# Patient Record
Sex: Male | Born: 1975 | Race: White | Hispanic: No | Marital: Single | Smoking: Never smoker
Health system: Southern US, Community
[De-identification: ages and names within clinical notes are randomized; demographics above are authoritative.]

## PROBLEM LIST (undated history)

## (undated) DIAGNOSIS — G834 Cauda equina syndrome: Secondary | ICD-10-CM

## (undated) DIAGNOSIS — E291 Testicular hypofunction: Secondary | ICD-10-CM

## (undated) DIAGNOSIS — I10 Essential (primary) hypertension: Secondary | ICD-10-CM

## (undated) HISTORY — DX: Cauda equina syndrome: G83.4

## (undated) HISTORY — PX: OTHER SURGICAL HISTORY: SHX169

---

## 1995-08-05 HISTORY — PX: BACK SURGERY: SHX140

## 1995-08-05 HISTORY — PX: OTHER SURGICAL HISTORY: SHX169

## 2008-12-25 ENCOUNTER — Ambulatory Visit: Payer: Self-pay

## 2009-08-04 HISTORY — PX: SHOULDER SURGERY: SHX246

## 2011-08-25 DIAGNOSIS — E236 Other disorders of pituitary gland: Secondary | ICD-10-CM | POA: Diagnosis not present

## 2011-08-25 DIAGNOSIS — R030 Elevated blood-pressure reading, without diagnosis of hypertension: Secondary | ICD-10-CM | POA: Diagnosis not present

## 2012-02-24 DIAGNOSIS — E236 Other disorders of pituitary gland: Secondary | ICD-10-CM | POA: Diagnosis not present

## 2012-02-24 DIAGNOSIS — R030 Elevated blood-pressure reading, without diagnosis of hypertension: Secondary | ICD-10-CM | POA: Diagnosis not present

## 2012-09-13 DIAGNOSIS — R030 Elevated blood-pressure reading, without diagnosis of hypertension: Secondary | ICD-10-CM | POA: Diagnosis not present

## 2012-09-13 DIAGNOSIS — E230 Hypopituitarism: Secondary | ICD-10-CM | POA: Insufficient documentation

## 2012-09-13 DIAGNOSIS — F411 Generalized anxiety disorder: Secondary | ICD-10-CM | POA: Diagnosis not present

## 2012-09-13 DIAGNOSIS — E236 Other disorders of pituitary gland: Secondary | ICD-10-CM | POA: Diagnosis not present

## 2012-09-13 DIAGNOSIS — N429 Disorder of prostate, unspecified: Secondary | ICD-10-CM | POA: Diagnosis not present

## 2012-12-07 ENCOUNTER — Emergency Department: Payer: Self-pay | Admitting: Emergency Medicine

## 2012-12-07 DIAGNOSIS — S81009A Unspecified open wound, unspecified knee, initial encounter: Secondary | ICD-10-CM | POA: Diagnosis not present

## 2013-08-04 HISTORY — PX: OTHER SURGICAL HISTORY: SHX169

## 2013-11-29 DIAGNOSIS — E291 Testicular hypofunction: Secondary | ICD-10-CM | POA: Diagnosis not present

## 2013-12-14 DIAGNOSIS — R946 Abnormal results of thyroid function studies: Secondary | ICD-10-CM | POA: Diagnosis not present

## 2013-12-27 DIAGNOSIS — E063 Autoimmune thyroiditis: Secondary | ICD-10-CM | POA: Diagnosis not present

## 2013-12-27 DIAGNOSIS — E039 Hypothyroidism, unspecified: Secondary | ICD-10-CM | POA: Diagnosis not present

## 2013-12-27 DIAGNOSIS — E291 Testicular hypofunction: Secondary | ICD-10-CM | POA: Diagnosis not present

## 2014-01-24 DIAGNOSIS — H579 Unspecified disorder of eye and adnexa: Secondary | ICD-10-CM | POA: Diagnosis not present

## 2014-02-27 DIAGNOSIS — E039 Hypothyroidism, unspecified: Secondary | ICD-10-CM | POA: Diagnosis not present

## 2014-03-14 DIAGNOSIS — E291 Testicular hypofunction: Secondary | ICD-10-CM | POA: Diagnosis not present

## 2014-03-14 DIAGNOSIS — E039 Hypothyroidism, unspecified: Secondary | ICD-10-CM | POA: Diagnosis not present

## 2014-03-14 DIAGNOSIS — E063 Autoimmune thyroiditis: Secondary | ICD-10-CM | POA: Diagnosis not present

## 2014-03-21 DIAGNOSIS — M5412 Radiculopathy, cervical region: Secondary | ICD-10-CM | POA: Diagnosis not present

## 2014-03-21 DIAGNOSIS — M9981 Other biomechanical lesions of cervical region: Secondary | ICD-10-CM | POA: Diagnosis not present

## 2014-03-21 DIAGNOSIS — M62838 Other muscle spasm: Secondary | ICD-10-CM | POA: Diagnosis not present

## 2014-03-21 DIAGNOSIS — M999 Biomechanical lesion, unspecified: Secondary | ICD-10-CM | POA: Diagnosis not present

## 2014-03-22 DIAGNOSIS — M5412 Radiculopathy, cervical region: Secondary | ICD-10-CM | POA: Diagnosis not present

## 2014-03-22 DIAGNOSIS — M9981 Other biomechanical lesions of cervical region: Secondary | ICD-10-CM | POA: Diagnosis not present

## 2014-03-22 DIAGNOSIS — M999 Biomechanical lesion, unspecified: Secondary | ICD-10-CM | POA: Diagnosis not present

## 2014-03-22 DIAGNOSIS — M62838 Other muscle spasm: Secondary | ICD-10-CM | POA: Diagnosis not present

## 2014-03-23 DIAGNOSIS — M62838 Other muscle spasm: Secondary | ICD-10-CM | POA: Diagnosis not present

## 2014-03-23 DIAGNOSIS — M5412 Radiculopathy, cervical region: Secondary | ICD-10-CM | POA: Diagnosis not present

## 2014-03-23 DIAGNOSIS — M999 Biomechanical lesion, unspecified: Secondary | ICD-10-CM | POA: Diagnosis not present

## 2014-03-23 DIAGNOSIS — M9981 Other biomechanical lesions of cervical region: Secondary | ICD-10-CM | POA: Diagnosis not present

## 2014-03-27 DIAGNOSIS — M9981 Other biomechanical lesions of cervical region: Secondary | ICD-10-CM | POA: Diagnosis not present

## 2014-03-27 DIAGNOSIS — M62838 Other muscle spasm: Secondary | ICD-10-CM | POA: Diagnosis not present

## 2014-03-27 DIAGNOSIS — M5412 Radiculopathy, cervical region: Secondary | ICD-10-CM | POA: Diagnosis not present

## 2014-03-27 DIAGNOSIS — M999 Biomechanical lesion, unspecified: Secondary | ICD-10-CM | POA: Diagnosis not present

## 2014-03-30 DIAGNOSIS — M9981 Other biomechanical lesions of cervical region: Secondary | ICD-10-CM | POA: Diagnosis not present

## 2014-03-30 DIAGNOSIS — M5412 Radiculopathy, cervical region: Secondary | ICD-10-CM | POA: Diagnosis not present

## 2014-03-30 DIAGNOSIS — M999 Biomechanical lesion, unspecified: Secondary | ICD-10-CM | POA: Diagnosis not present

## 2014-03-30 DIAGNOSIS — M62838 Other muscle spasm: Secondary | ICD-10-CM | POA: Diagnosis not present

## 2014-03-31 DIAGNOSIS — M999 Biomechanical lesion, unspecified: Secondary | ICD-10-CM | POA: Diagnosis not present

## 2014-03-31 DIAGNOSIS — M62838 Other muscle spasm: Secondary | ICD-10-CM | POA: Diagnosis not present

## 2014-03-31 DIAGNOSIS — M9981 Other biomechanical lesions of cervical region: Secondary | ICD-10-CM | POA: Diagnosis not present

## 2014-03-31 DIAGNOSIS — M5412 Radiculopathy, cervical region: Secondary | ICD-10-CM | POA: Diagnosis not present

## 2014-04-04 DIAGNOSIS — M999 Biomechanical lesion, unspecified: Secondary | ICD-10-CM | POA: Diagnosis not present

## 2014-04-04 DIAGNOSIS — M9981 Other biomechanical lesions of cervical region: Secondary | ICD-10-CM | POA: Diagnosis not present

## 2014-04-04 DIAGNOSIS — M5412 Radiculopathy, cervical region: Secondary | ICD-10-CM | POA: Diagnosis not present

## 2014-04-04 DIAGNOSIS — M62838 Other muscle spasm: Secondary | ICD-10-CM | POA: Diagnosis not present

## 2014-04-06 DIAGNOSIS — M999 Biomechanical lesion, unspecified: Secondary | ICD-10-CM | POA: Diagnosis not present

## 2014-04-06 DIAGNOSIS — M9981 Other biomechanical lesions of cervical region: Secondary | ICD-10-CM | POA: Diagnosis not present

## 2014-04-06 DIAGNOSIS — M62838 Other muscle spasm: Secondary | ICD-10-CM | POA: Diagnosis not present

## 2014-04-06 DIAGNOSIS — M5412 Radiculopathy, cervical region: Secondary | ICD-10-CM | POA: Diagnosis not present

## 2014-04-07 DIAGNOSIS — M9981 Other biomechanical lesions of cervical region: Secondary | ICD-10-CM | POA: Diagnosis not present

## 2014-04-07 DIAGNOSIS — M999 Biomechanical lesion, unspecified: Secondary | ICD-10-CM | POA: Diagnosis not present

## 2014-04-07 DIAGNOSIS — M5412 Radiculopathy, cervical region: Secondary | ICD-10-CM | POA: Diagnosis not present

## 2014-04-07 DIAGNOSIS — M62838 Other muscle spasm: Secondary | ICD-10-CM | POA: Diagnosis not present

## 2014-04-12 DIAGNOSIS — M5412 Radiculopathy, cervical region: Secondary | ICD-10-CM | POA: Diagnosis not present

## 2014-04-12 DIAGNOSIS — M62838 Other muscle spasm: Secondary | ICD-10-CM | POA: Diagnosis not present

## 2014-04-12 DIAGNOSIS — M999 Biomechanical lesion, unspecified: Secondary | ICD-10-CM | POA: Diagnosis not present

## 2014-04-12 DIAGNOSIS — M9981 Other biomechanical lesions of cervical region: Secondary | ICD-10-CM | POA: Diagnosis not present

## 2014-04-18 DIAGNOSIS — M5412 Radiculopathy, cervical region: Secondary | ICD-10-CM | POA: Diagnosis not present

## 2014-04-18 DIAGNOSIS — M9981 Other biomechanical lesions of cervical region: Secondary | ICD-10-CM | POA: Diagnosis not present

## 2014-04-18 DIAGNOSIS — M999 Biomechanical lesion, unspecified: Secondary | ICD-10-CM | POA: Diagnosis not present

## 2014-04-18 DIAGNOSIS — M62838 Other muscle spasm: Secondary | ICD-10-CM | POA: Diagnosis not present

## 2014-04-19 DIAGNOSIS — M5412 Radiculopathy, cervical region: Secondary | ICD-10-CM | POA: Diagnosis not present

## 2014-04-19 DIAGNOSIS — M999 Biomechanical lesion, unspecified: Secondary | ICD-10-CM | POA: Diagnosis not present

## 2014-04-19 DIAGNOSIS — M9981 Other biomechanical lesions of cervical region: Secondary | ICD-10-CM | POA: Diagnosis not present

## 2014-04-19 DIAGNOSIS — M62838 Other muscle spasm: Secondary | ICD-10-CM | POA: Diagnosis not present

## 2014-04-21 DIAGNOSIS — M999 Biomechanical lesion, unspecified: Secondary | ICD-10-CM | POA: Diagnosis not present

## 2014-04-21 DIAGNOSIS — M9981 Other biomechanical lesions of cervical region: Secondary | ICD-10-CM | POA: Diagnosis not present

## 2014-04-21 DIAGNOSIS — M62838 Other muscle spasm: Secondary | ICD-10-CM | POA: Diagnosis not present

## 2014-04-21 DIAGNOSIS — M5412 Radiculopathy, cervical region: Secondary | ICD-10-CM | POA: Diagnosis not present

## 2014-04-24 DIAGNOSIS — M999 Biomechanical lesion, unspecified: Secondary | ICD-10-CM | POA: Diagnosis not present

## 2014-04-24 DIAGNOSIS — M62838 Other muscle spasm: Secondary | ICD-10-CM | POA: Diagnosis not present

## 2014-04-24 DIAGNOSIS — M5412 Radiculopathy, cervical region: Secondary | ICD-10-CM | POA: Diagnosis not present

## 2014-04-24 DIAGNOSIS — M9981 Other biomechanical lesions of cervical region: Secondary | ICD-10-CM | POA: Diagnosis not present

## 2014-04-25 DIAGNOSIS — M5412 Radiculopathy, cervical region: Secondary | ICD-10-CM | POA: Diagnosis not present

## 2014-04-25 DIAGNOSIS — M62838 Other muscle spasm: Secondary | ICD-10-CM | POA: Diagnosis not present

## 2014-04-25 DIAGNOSIS — M9981 Other biomechanical lesions of cervical region: Secondary | ICD-10-CM | POA: Diagnosis not present

## 2014-04-25 DIAGNOSIS — M999 Biomechanical lesion, unspecified: Secondary | ICD-10-CM | POA: Diagnosis not present

## 2014-04-28 DIAGNOSIS — M62838 Other muscle spasm: Secondary | ICD-10-CM | POA: Diagnosis not present

## 2014-04-28 DIAGNOSIS — M999 Biomechanical lesion, unspecified: Secondary | ICD-10-CM | POA: Diagnosis not present

## 2014-04-28 DIAGNOSIS — M5412 Radiculopathy, cervical region: Secondary | ICD-10-CM | POA: Diagnosis not present

## 2014-04-28 DIAGNOSIS — M9981 Other biomechanical lesions of cervical region: Secondary | ICD-10-CM | POA: Diagnosis not present

## 2014-05-01 DIAGNOSIS — M62838 Other muscle spasm: Secondary | ICD-10-CM | POA: Diagnosis not present

## 2014-05-01 DIAGNOSIS — M999 Biomechanical lesion, unspecified: Secondary | ICD-10-CM | POA: Diagnosis not present

## 2014-05-01 DIAGNOSIS — M9981 Other biomechanical lesions of cervical region: Secondary | ICD-10-CM | POA: Diagnosis not present

## 2014-05-01 DIAGNOSIS — M5412 Radiculopathy, cervical region: Secondary | ICD-10-CM | POA: Diagnosis not present

## 2014-05-03 DIAGNOSIS — M9981 Other biomechanical lesions of cervical region: Secondary | ICD-10-CM | POA: Diagnosis not present

## 2014-05-03 DIAGNOSIS — M5412 Radiculopathy, cervical region: Secondary | ICD-10-CM | POA: Diagnosis not present

## 2014-05-03 DIAGNOSIS — M62838 Other muscle spasm: Secondary | ICD-10-CM | POA: Diagnosis not present

## 2014-05-03 DIAGNOSIS — M999 Biomechanical lesion, unspecified: Secondary | ICD-10-CM | POA: Diagnosis not present

## 2014-05-08 DIAGNOSIS — M5134 Other intervertebral disc degeneration, thoracic region: Secondary | ICD-10-CM | POA: Diagnosis not present

## 2014-05-08 DIAGNOSIS — M9901 Segmental and somatic dysfunction of cervical region: Secondary | ICD-10-CM | POA: Diagnosis not present

## 2014-05-08 DIAGNOSIS — M5412 Radiculopathy, cervical region: Secondary | ICD-10-CM | POA: Diagnosis not present

## 2014-05-08 DIAGNOSIS — M9902 Segmental and somatic dysfunction of thoracic region: Secondary | ICD-10-CM | POA: Diagnosis not present

## 2014-05-10 DIAGNOSIS — M9902 Segmental and somatic dysfunction of thoracic region: Secondary | ICD-10-CM | POA: Diagnosis not present

## 2014-05-10 DIAGNOSIS — M9901 Segmental and somatic dysfunction of cervical region: Secondary | ICD-10-CM | POA: Diagnosis not present

## 2014-05-10 DIAGNOSIS — M5412 Radiculopathy, cervical region: Secondary | ICD-10-CM | POA: Diagnosis not present

## 2014-05-10 DIAGNOSIS — M5134 Other intervertebral disc degeneration, thoracic region: Secondary | ICD-10-CM | POA: Diagnosis not present

## 2014-05-15 DIAGNOSIS — E039 Hypothyroidism, unspecified: Secondary | ICD-10-CM | POA: Diagnosis not present

## 2014-05-15 DIAGNOSIS — E291 Testicular hypofunction: Secondary | ICD-10-CM | POA: Diagnosis not present

## 2014-05-16 DIAGNOSIS — M5412 Radiculopathy, cervical region: Secondary | ICD-10-CM | POA: Diagnosis not present

## 2014-05-16 DIAGNOSIS — M5134 Other intervertebral disc degeneration, thoracic region: Secondary | ICD-10-CM | POA: Diagnosis not present

## 2014-05-16 DIAGNOSIS — M9902 Segmental and somatic dysfunction of thoracic region: Secondary | ICD-10-CM | POA: Diagnosis not present

## 2014-05-16 DIAGNOSIS — M9901 Segmental and somatic dysfunction of cervical region: Secondary | ICD-10-CM | POA: Diagnosis not present

## 2014-05-18 DIAGNOSIS — M9901 Segmental and somatic dysfunction of cervical region: Secondary | ICD-10-CM | POA: Diagnosis not present

## 2014-05-18 DIAGNOSIS — M5134 Other intervertebral disc degeneration, thoracic region: Secondary | ICD-10-CM | POA: Diagnosis not present

## 2014-05-18 DIAGNOSIS — M9902 Segmental and somatic dysfunction of thoracic region: Secondary | ICD-10-CM | POA: Diagnosis not present

## 2014-05-18 DIAGNOSIS — M5412 Radiculopathy, cervical region: Secondary | ICD-10-CM | POA: Diagnosis not present

## 2014-05-22 DIAGNOSIS — E230 Hypopituitarism: Secondary | ICD-10-CM | POA: Diagnosis not present

## 2014-05-22 DIAGNOSIS — E063 Autoimmune thyroiditis: Secondary | ICD-10-CM | POA: Diagnosis not present

## 2014-05-22 DIAGNOSIS — E038 Other specified hypothyroidism: Secondary | ICD-10-CM | POA: Diagnosis not present

## 2014-05-24 DIAGNOSIS — M9901 Segmental and somatic dysfunction of cervical region: Secondary | ICD-10-CM | POA: Diagnosis not present

## 2014-05-24 DIAGNOSIS — M5412 Radiculopathy, cervical region: Secondary | ICD-10-CM | POA: Diagnosis not present

## 2014-05-24 DIAGNOSIS — M9902 Segmental and somatic dysfunction of thoracic region: Secondary | ICD-10-CM | POA: Diagnosis not present

## 2014-05-24 DIAGNOSIS — M5134 Other intervertebral disc degeneration, thoracic region: Secondary | ICD-10-CM | POA: Diagnosis not present

## 2014-05-29 DIAGNOSIS — M9902 Segmental and somatic dysfunction of thoracic region: Secondary | ICD-10-CM | POA: Diagnosis not present

## 2014-05-29 DIAGNOSIS — M9901 Segmental and somatic dysfunction of cervical region: Secondary | ICD-10-CM | POA: Diagnosis not present

## 2014-05-29 DIAGNOSIS — M5412 Radiculopathy, cervical region: Secondary | ICD-10-CM | POA: Diagnosis not present

## 2014-05-29 DIAGNOSIS — M5134 Other intervertebral disc degeneration, thoracic region: Secondary | ICD-10-CM | POA: Diagnosis not present

## 2014-06-05 DIAGNOSIS — M9901 Segmental and somatic dysfunction of cervical region: Secondary | ICD-10-CM | POA: Diagnosis not present

## 2014-06-05 DIAGNOSIS — M9902 Segmental and somatic dysfunction of thoracic region: Secondary | ICD-10-CM | POA: Diagnosis not present

## 2014-06-05 DIAGNOSIS — M5412 Radiculopathy, cervical region: Secondary | ICD-10-CM | POA: Diagnosis not present

## 2014-06-05 DIAGNOSIS — M5134 Other intervertebral disc degeneration, thoracic region: Secondary | ICD-10-CM | POA: Diagnosis not present

## 2014-06-19 DIAGNOSIS — M5134 Other intervertebral disc degeneration, thoracic region: Secondary | ICD-10-CM | POA: Diagnosis not present

## 2014-06-19 DIAGNOSIS — M9902 Segmental and somatic dysfunction of thoracic region: Secondary | ICD-10-CM | POA: Diagnosis not present

## 2014-06-19 DIAGNOSIS — M5412 Radiculopathy, cervical region: Secondary | ICD-10-CM | POA: Diagnosis not present

## 2014-06-19 DIAGNOSIS — M9901 Segmental and somatic dysfunction of cervical region: Secondary | ICD-10-CM | POA: Diagnosis not present

## 2014-07-13 DIAGNOSIS — M5134 Other intervertebral disc degeneration, thoracic region: Secondary | ICD-10-CM | POA: Diagnosis not present

## 2014-07-13 DIAGNOSIS — M9901 Segmental and somatic dysfunction of cervical region: Secondary | ICD-10-CM | POA: Diagnosis not present

## 2014-07-13 DIAGNOSIS — M9902 Segmental and somatic dysfunction of thoracic region: Secondary | ICD-10-CM | POA: Diagnosis not present

## 2014-07-13 DIAGNOSIS — M5412 Radiculopathy, cervical region: Secondary | ICD-10-CM | POA: Diagnosis not present

## 2014-09-12 DIAGNOSIS — E063 Autoimmune thyroiditis: Secondary | ICD-10-CM | POA: Diagnosis not present

## 2014-09-12 DIAGNOSIS — E038 Other specified hypothyroidism: Secondary | ICD-10-CM | POA: Diagnosis not present

## 2014-09-12 DIAGNOSIS — E230 Hypopituitarism: Secondary | ICD-10-CM | POA: Diagnosis not present

## 2015-04-23 DIAGNOSIS — E038 Other specified hypothyroidism: Secondary | ICD-10-CM | POA: Diagnosis not present

## 2015-04-23 DIAGNOSIS — E291 Testicular hypofunction: Secondary | ICD-10-CM | POA: Diagnosis not present

## 2015-04-23 DIAGNOSIS — R5383 Other fatigue: Secondary | ICD-10-CM | POA: Diagnosis not present

## 2015-04-23 DIAGNOSIS — E063 Autoimmune thyroiditis: Secondary | ICD-10-CM | POA: Diagnosis not present

## 2015-04-23 DIAGNOSIS — E230 Hypopituitarism: Secondary | ICD-10-CM | POA: Diagnosis not present

## 2015-04-25 DIAGNOSIS — E063 Autoimmune thyroiditis: Secondary | ICD-10-CM | POA: Diagnosis not present

## 2015-04-25 DIAGNOSIS — E038 Other specified hypothyroidism: Secondary | ICD-10-CM | POA: Diagnosis not present

## 2015-04-25 DIAGNOSIS — E230 Hypopituitarism: Secondary | ICD-10-CM | POA: Diagnosis not present

## 2015-04-25 DIAGNOSIS — R5383 Other fatigue: Secondary | ICD-10-CM | POA: Diagnosis not present

## 2015-07-10 DIAGNOSIS — M7711 Lateral epicondylitis, right elbow: Secondary | ICD-10-CM | POA: Diagnosis not present

## 2015-08-05 HISTORY — PX: ELBOW SURGERY: SHX618

## 2015-09-28 DIAGNOSIS — M7711 Lateral epicondylitis, right elbow: Secondary | ICD-10-CM | POA: Diagnosis not present

## 2015-10-22 DIAGNOSIS — E230 Hypopituitarism: Secondary | ICD-10-CM | POA: Diagnosis not present

## 2015-10-22 DIAGNOSIS — E038 Other specified hypothyroidism: Secondary | ICD-10-CM | POA: Diagnosis not present

## 2015-10-23 ENCOUNTER — Ambulatory Visit (INDEPENDENT_AMBULATORY_CARE_PROVIDER_SITE_OTHER): Payer: Medicare Other | Admitting: Family Medicine

## 2015-10-23 ENCOUNTER — Encounter: Payer: Self-pay | Admitting: Family Medicine

## 2015-10-23 ENCOUNTER — Other Ambulatory Visit (INDEPENDENT_AMBULATORY_CARE_PROVIDER_SITE_OTHER): Payer: Medicare Other

## 2015-10-23 VITALS — BP 132/80 | HR 72 | Wt 208.0 lb

## 2015-10-23 DIAGNOSIS — M7711 Lateral epicondylitis, right elbow: Secondary | ICD-10-CM | POA: Diagnosis not present

## 2015-10-23 DIAGNOSIS — M25521 Pain in right elbow: Secondary | ICD-10-CM

## 2015-10-23 MED ORDER — NITROGLYCERIN 0.2 MG/HR TD PT24: MEDICATED_PATCH | TRANSDERMAL | Status: DC

## 2015-10-23 MED ORDER — DICLOFENAC SODIUM 2 % TD SOLN: 2.0000 "application " | Freq: Two times a day (BID) | TRANSDERMAL | Status: DC

## 2015-10-23 NOTE — Assessment & Plan Note (Signed)
Patient does have more of a lateral epicondylitis with some partial tearing of the common extensor tendon. We discussed with patient about different treatment options and he has elected try conservative therapy. Has already had 2 injections with minimal benefit. Patient will be started on nitroglycerin patches and warned of potential side effects. We discussed icing regimen. We discussed home exercises. Patient work with Product/process development scientist. Patient will do bracing for 23 hours daily for the first week and then nightly for 2 weeks. Patient will try to do an icing protocol. Topical anti-inflammatories prescribed. Patient come back and see me again in 3-4 weeks. We'll rescan to make sure the patient is healing appropriately.

## 2015-10-23 NOTE — Progress Notes (Signed)
[facility] [address] [place], [place] [address] Phone: [phone] Subjective:    I'm seeing this patient by the request  of: [facility] clinic. Dr. Deloria Lair  CC: right elbow pain  [id number] Timothy Powell is a 40 y.o. male coming in with complaint of right elbow pain. Seems to have started in October. Does not remember any mechanism. Does do a lot of repetitive motions. Patient rates the severity of pain a 6 out of 10.'s on the provider and was given an injection in February around the right elbow as well as again in March. No significant improvement. Feels that it is seems to be worsening. Not debilitating but can cause enough pain for him to use his contralateral side on a more regular basis. Denies any nighttime awakening. Denies any swelling redness or any bruising. States that he would not say he is weak but does notice because of the pain he likes to avoid lifting anything heavy.     No past medical history on file.no pertinent musculoskeletal history No past surgical history on file. Social History   Social History  . Marital Status: Single    Spouse Name: N/A  . Number of Children: N/A  . Years of Education: N/A   Social History Main Topics  . Smoking status: Not on file  . Smokeless tobacco: Not on file  . Alcohol Use: Not on file  . Drug Use: Not on file  . Sexual Activity: Not on file   Other Topics Concern  . Not on file   Social History Narrative  . No narrative on file   Not on [place] known drug allergies No family history on file.no history of rheumatological diseases  Past medical history, social, surgical and family history all reviewed in electronic medical record.  No pertanent information unless stated regarding to the chief complaint.   Review of Systems: No headache, visual changes, nausea, vomiting, diarrhea, constipation, dizziness, abdominal pain, skin rash, fevers, chills, night sweats, weight loss, swollen lymph  nodes, body aches, joint swelling, muscle aches, chest pain, shortness of breath, mood changes.   Objective Blood pressure 132/80, pulse 72, weight 208 lb (94.348 kg).  General: No apparent distress alert and oriented x3 mood and affect normal, dressed appropriately.  HEENT: Pupils equal, extraocular movements intact  Respiratory: Patient's speak in full sentences and does not appear short of breath  Cardiovascular: No lower extremity edema, non tender, no erythema  Skin: Warm dry intact with no signs of infection or rash on extremities or on axial skeleton.  Abdomen: Soft nontender  Neuro: Cranial nerves II through XII are intact, neurovascularly intact in all extremities with 2+ DTRs and 2+ pulses.  Lymph: No lymphadenopathy of posterior or anterior cervical chain or axillae bilaterally.  Gait normal with good balance and coordination.  MSK:  Non tender with full range of motion and good stability and symmetric strength and tone of shoulders,  wrist, hip, knee and ankles bilaterally.  Elbow:right Unremarkable to inspection. Range of motion full pronation, supination, flexion, extension. Strength is full to all of the above directions Stable to varus, valgus stress. Negative moving valgus stress test. Tender over the lateral epicondylar region. Pain with resisted wrist extension Ulnar nerve does not sublux. Negative cubital tunnel Tinel's. Contralateral elbow unremarkable  Musculoskeletal ultrasound was performed and interpreted by Charlann Boxer D.O.   Elbow: right Lateral epicondyle and common extensor tendon shows hypoechoic changes and increasing Doppler flow. Patient does have what appears  to be a articular side tear of approximately 25-30% of the tendon. Radial head unremarkable and located in annular ligament Medial epicondyle and common flexor tendon origin visualized.  No edema, effusions, or avulsions seen. Ulnar nerve in cubital tunnel unremarkable. Olecranon and triceps  insertion visualized and unremarkable without edema, effusion, or avulsion.  No signs olecranon bursitis. Power doppler signal normal.  IMPRESSION:  Partial, extensor tendon tear with lateral epicondylitis  Procedure note [id number]; 15 minutes spent for Therapeutic exercises as stated in above notes.  This included exercises focusing on stretching, strengthening, with significant focus on eccentric aspects.  Flexion and extension exercises working on the wrist as well as supination and pronation given focusing on eccentric strengthening. Proper technique shown and discussed handout in great detail with ATC.  All questions were discussed and answered.     Impression and Recommendations:     This case required medical decision making of moderate complexity.      Note: This dictation was prepared with Dragon dictation along with smaller phrase technology. Any transcriptional errors that result from this process are unintentional.

## 2015-10-23 NOTE — Patient Instructions (Signed)
Good to see you.  Ice 20 minutes 2 times daily. Usually after activity and before bed. Exercises 3 times a week.  pennsaid pinkie amount topically 2 times daily as needed.  Wear brace day and night for 1 week then nightly for 2 weeks If it helps keep wearing it with work  Lift underhand or thumbs up.  Nitroglycerin Protocol   Apply 1/4 nitroglycerin patch to affected area daily.  Change position of patch within the affected area every 24 hours.  You may experience a headache during the first 1-2 weeks of using the patch, these should subside.  If you experience headaches after beginning nitroglycerin patch treatment, you may take your preferred over the counter pain reliever.  Another side effect of the nitroglycerin patch is skin irritation or rash related to patch adhesive.  Please notify our office if you develop more severe headaches or rash, and stop the patch.  Tendon healing with nitroglycerin patch may require 12 to 24 weeks depending on the extent of injury.  Men should not use if taking Viagra, Cialis, or Levitra.   Do not use if you have migraines or rosacea.   See me again in 4 weeks and we will rescan.

## 2015-10-29 DIAGNOSIS — R5383 Other fatigue: Secondary | ICD-10-CM | POA: Diagnosis not present

## 2015-10-29 DIAGNOSIS — E063 Autoimmune thyroiditis: Secondary | ICD-10-CM | POA: Diagnosis not present

## 2015-10-29 DIAGNOSIS — E038 Other specified hypothyroidism: Secondary | ICD-10-CM | POA: Diagnosis not present

## 2015-10-29 DIAGNOSIS — E230 Hypopituitarism: Secondary | ICD-10-CM | POA: Diagnosis not present

## 2015-11-20 ENCOUNTER — Ambulatory Visit (INDEPENDENT_AMBULATORY_CARE_PROVIDER_SITE_OTHER): Payer: Medicare Other | Admitting: Family Medicine

## 2015-11-20 ENCOUNTER — Encounter: Payer: Self-pay | Admitting: Family Medicine

## 2015-11-20 VITALS — BP 138/86 | HR 83 | Ht 70.0 in | Wt 208.0 lb

## 2015-11-20 DIAGNOSIS — M7711 Lateral epicondylitis, right elbow: Secondary | ICD-10-CM

## 2015-11-20 NOTE — Progress Notes (Signed)
Pre visit review using our clinic review tool, if applicable. No additional management support is needed unless otherwise documented below in the visit note. 

## 2015-11-20 NOTE — Assessment & Plan Note (Signed)
Overall at this point I do think the patient has made some improvement but not considerable with him being somewhat noncompliant. We discussed icing regimen. We discussed the importance of the nitroglycerin which patient states he'll try. Once again warned of potential side effects. We discussed the importance of the bracing and the home exercises. Patient declined formal physical therapy. Patient will try to continue these conservative therapies and see me again in 4 weeks. If no significant improvement at that time potentially advance imaging or recurrent formal physical therapy would be warranted. All questions answered today.  Spent  25 minutes with patient face-to-face and had greater than 50% of counseling including as described above in assessment and plan.

## 2015-11-20 NOTE — Progress Notes (Signed)
[name] [place], [place] [address] Phone: [phone] Subjective:    I'm seeing this patient by the request  of: [facility] clinic. Dr. Deloria Lair  CC: right elbow pain Follow-up  [id number] Timothy Powell is a 40 y.o. male coming in with complaint of right elbow pain. Seems to have started in October. Does not remember any mechanism.  Patient was seen by me and did have an extensor tendon tear noted. Patient was to start a nitroglycerin patch which she did not do. Patient only did the exercises occasionally. Didn't do the bracing occasionally. States that it did improve by approximately 40%. Still has some discomfort. Patient though states that this first time it has improved in 6 months. States that there is still some mild limitation and weakness with extension of the wrist but overall feeling better. Nose that he needs to do better overall.     No past medical history on file.no pertinent musculoskeletal history No past surgical history on file. Social History   Social History  . Marital Status: Single    Spouse Name: N/A  . Number of Children: N/A  . Years of Education: N/A   Social History Main Topics  . Smoking status: Unknown If Ever Smoked  . Smokeless tobacco: None  . Alcohol Use: None  . Drug Use: None  . Sexual Activity: Not Asked   Other Topics Concern  . None   Social History Narrative   Not on [facility] known drug allergies No family history on file.no history of rheumatological diseases  Past medical history, social, surgical and family history all reviewed in electronic medical record.  No pertanent information unless stated regarding to the chief complaint.   Review of Systems: No headache, visual changes, nausea, vomiting, diarrhea, constipation, dizziness, abdominal pain, skin rash, fevers, chills, night sweats, weight loss, swollen lymph nodes, body aches, joint swelling, muscle aches, chest pain, shortness of  breath, mood changes.   Objective Blood pressure 138/86, pulse 83, height 5\' 10"  (1.778 m), weight 208 lb (94.348 kg), SpO2 95 %.  General: No apparent distress alert and oriented x3 mood and affect normal, dressed appropriately.  HEENT: Pupils equal, extraocular movements intact  Respiratory: Patient's speak in full sentences and does not appear short of breath  Cardiovascular: No lower extremity edema, non tender, no erythema  Skin: Warm dry intact with no signs of infection or rash on extremities or on axial skeleton.  Abdomen: Soft nontender  Neuro: Cranial nerves II through XII are intact, neurovascularly intact in all extremities with 2+ DTRs and 2+ pulses.  Lymph: No lymphadenopathy of posterior or anterior cervical chain or axillae bilaterally.  Gait normal with good balance and coordination.  MSK:  Non tender with full range of motion and good stability and symmetric strength and tone of shoulders,  wrist, hip, knee and ankles bilaterally.  Elbow:right Unremarkable to inspection. Range of motion full pronation, supination, flexion, extension. Strength is full to all of the above directions Stable to varus, valgus stress. Negative moving valgus stress test. Still mild discomfort with resisted extension. 4-5 strength of wrist extension compared to the contralateral side. Ulnar nerve does not sublux. Negative cubital tunnel Tinel's. Contralateral elbow unremarkable      Impression and Recommendations:     This case required medical decision making of moderate complexity.      Note: This dictation was prepared with Dragon dictation along with smaller phrase technology. Any transcriptional errors that result from this  process are unintentional.

## 2015-11-20 NOTE — Patient Instructions (Signed)
Good to see you  Start the patch tonight and expect a little headache.  Ice still after activity  Try to avoid extending the wrisat on any lift and use dumbells to keep hands in neutral position.  Exercises 3 times a week.  See me again in 4 weeks and we will make sure you are better.

## 2015-12-18 ENCOUNTER — Encounter: Payer: Self-pay | Admitting: Family Medicine

## 2015-12-18 ENCOUNTER — Other Ambulatory Visit: Payer: Self-pay

## 2015-12-18 ENCOUNTER — Ambulatory Visit (INDEPENDENT_AMBULATORY_CARE_PROVIDER_SITE_OTHER): Payer: Medicare Other | Admitting: Family Medicine

## 2015-12-18 VITALS — BP 132/86 | HR 74 | Ht 70.0 in | Wt 211.0 lb

## 2015-12-18 DIAGNOSIS — M7711 Lateral epicondylitis, right elbow: Secondary | ICD-10-CM | POA: Diagnosis not present

## 2015-12-18 DIAGNOSIS — M25521 Pain in right elbow: Secondary | ICD-10-CM | POA: Diagnosis not present

## 2015-12-18 NOTE — Assessment & Plan Note (Signed)
Discussed with patient at great length. This is affecting all daily activities and he has failed all conservative therapy including injections, icing, home exercises, formal physical therapy, and nitroglycerin. We have failed all other conservative therapy and advance imaging is warranted. Patient's MRI does have consistent tearing noted on ultrasound I do feel that surgical intervention will be necessary. Patient is willing to do this with is affecting all facets of his life.  Spent  25 minutes with patient face-to-face and had greater than 50% of counseling including as described above in assessment and plan.

## 2015-12-18 NOTE — Patient Instructions (Addendum)
Great to see you  Duexis 3 times a day for 3 days for pain  Continue the pennsaid Can stop the nitro We will get MRI and I will write you what is the next step.

## 2015-12-18 NOTE — Progress Notes (Signed)
Pre visit review using our clinic review tool, if applicable. No additional management support is needed unless otherwise documented below in the visit note. 

## 2015-12-18 NOTE — Progress Notes (Signed)
[facility] [address] [place], [place] [address] Phone: [phone] Subjective:     CC: right elbow pain Follow-up  [id number] Timothy Powell is a 40 y.o. male coming in with complaint of right elbow pain. Done to have more of a lateral epicondylitis. Patient and failed conservative therapy and was started on nitroglycerin patches. Patient was to continue home exercises, icing protocol, and we did discuss continuing with any wrist bracing. Patient states if anything he is unfortunately having worsening. Has had injections and states now he is noticed that the pain seems to be in tolerable. Not affecting all daily activities, his sleep at night, as well as his job. Having difficulty overall. Patient states that he thinks something more aggressive needs to be done.     No past medical history on file.no pertinent musculoskeletal history No past surgical history on file. Social History   Social History  . Marital Status: Single    Spouse Name: N/A  . Number of Children: N/A  . Years of Education: N/A   Social History Main Topics  . Smoking status: Unknown If Ever Smoked  . Smokeless tobacco: None  . Alcohol Use: None  . Drug Use: None  . Sexual Activity: Not Asked   Other Topics Concern  . None   Social History Narrative   Not on [place] known drug allergies No family history on file.no history of rheumatological diseases  Past medical history, social, surgical and family history all reviewed in electronic medical record.  No pertanent information unless stated regarding to the chief complaint.   Review of Systems: No headache, visual changes, nausea, vomiting, diarrhea, constipation, dizziness, abdominal pain, skin rash, fevers, chills, night sweats, weight loss, swollen lymph nodes, body aches, joint swelling, muscle aches, chest pain, shortness of breath, mood changes.   Objective Blood pressure 132/86, pulse 74, height 5\' 10"  (1.778 m),  weight 211 lb (95.709 kg), SpO2 94 %.  General: No apparent distress alert and oriented x3 mood and affect normal, dressed appropriately.  HEENT: Pupils equal, extraocular movements intact  Respiratory: Patient's speak in full sentences and does not appear short of breath  Cardiovascular: No lower extremity edema, non tender, no erythema  Skin: Warm dry intact with no signs of infection or rash on extremities or on axial skeleton.  Abdomen: Soft nontender  Neuro: Cranial nerves II through XII are intact, neurovascularly intact in all extremities with 2+ DTRs and 2+ pulses.  Lymph: No lymphadenopathy of posterior or anterior cervical chain or axillae bilaterally.  Gait normal with good balance and coordination.  MSK:  Non tender with full range of motion and good stability and symmetric strength and tone of shoulders,  wrist, hip, knee and ankles bilaterally.  Elbow:right Unremarkable to inspection. Range of motion full pronation, supination, flexion, extension. Strength is full to all of the above directions Stable to varus, valgus stress. Negative moving valgus stress test. Weakness worsening on the right side of the extensor common tendon. Ulnar nerve does not sublux. Negative cubital tunnel Tinel's. Contralateral elbow unremarkable   Musculoskeletal ultrasound was performed and interpreted by Charlann Boxer D.O.   Elbow:  Large tear of the common extensor tendon noted an approximately 25-35% mostly at the insertion. Hypoechoic changes and increasing Doppler flow. If anything worsening from previous imaging Radial head unremarkable and located in annular ligament Medial epicondyle and common flexor tendon origin visualized.  No edema, effusions, or avulsions seen. Ulnar nerve in cubital tunnel unremarkable. Olecranon  and triceps insertion visualized and unremarkable without edema, effusion, or avulsion.  No signs olecranon bursitis. Power doppler signal normal.  IMPRESSION:  Common  extensor tendon tear with worsening findings     Impression and Recommendations:     This case required medical decision making of moderate complexity.      Note: This dictation was prepared with Dragon dictation along with smaller phrase technology. Any transcriptional errors that result from this process are unintentional.

## 2015-12-25 ENCOUNTER — Ambulatory Visit
Admission: RE | Admit: 2015-12-25 | Discharge: 2015-12-25 | Disposition: A | Discharge status: Home / Self Care | Payer: Medicare Other | Source: Ambulatory Visit | Attending: Family Medicine | Admitting: Family Medicine

## 2015-12-25 DIAGNOSIS — M25521 Pain in right elbow: Secondary | ICD-10-CM

## 2015-12-25 DIAGNOSIS — S56511A Strain of other extensor muscle, fascia and tendon at forearm level, right arm, initial encounter: Secondary | ICD-10-CM | POA: Diagnosis not present

## 2015-12-26 ENCOUNTER — Other Ambulatory Visit: Payer: Self-pay | Admitting: *Deleted

## 2015-12-26 DIAGNOSIS — M7711 Lateral epicondylitis, right elbow: Secondary | ICD-10-CM

## 2015-12-28 DIAGNOSIS — M7711 Lateral epicondylitis, right elbow: Secondary | ICD-10-CM | POA: Diagnosis not present

## 2016-02-07 DIAGNOSIS — M7711 Lateral epicondylitis, right elbow: Secondary | ICD-10-CM | POA: Diagnosis not present

## 2016-02-25 DIAGNOSIS — Z9889 Other specified postprocedural states: Secondary | ICD-10-CM | POA: Diagnosis not present

## 2016-03-11 DIAGNOSIS — E230 Hypopituitarism: Secondary | ICD-10-CM | POA: Diagnosis not present

## 2016-03-11 DIAGNOSIS — E038 Other specified hypothyroidism: Secondary | ICD-10-CM | POA: Diagnosis not present

## 2016-03-13 DIAGNOSIS — E038 Other specified hypothyroidism: Secondary | ICD-10-CM | POA: Diagnosis not present

## 2016-03-13 DIAGNOSIS — E063 Autoimmune thyroiditis: Secondary | ICD-10-CM | POA: Diagnosis not present

## 2016-03-13 DIAGNOSIS — E230 Hypopituitarism: Secondary | ICD-10-CM | POA: Diagnosis not present

## 2016-03-17 DIAGNOSIS — Z9889 Other specified postprocedural states: Secondary | ICD-10-CM | POA: Diagnosis not present

## 2016-09-08 DIAGNOSIS — E039 Hypothyroidism, unspecified: Secondary | ICD-10-CM | POA: Diagnosis not present

## 2016-09-08 DIAGNOSIS — E230 Hypopituitarism: Secondary | ICD-10-CM | POA: Diagnosis not present

## 2016-09-11 DIAGNOSIS — E063 Autoimmune thyroiditis: Secondary | ICD-10-CM | POA: Insufficient documentation

## 2016-09-11 DIAGNOSIS — E039 Hypothyroidism, unspecified: Secondary | ICD-10-CM | POA: Diagnosis not present

## 2016-09-11 DIAGNOSIS — E230 Hypopituitarism: Secondary | ICD-10-CM | POA: Diagnosis not present

## 2016-10-14 ENCOUNTER — Ambulatory Visit: Payer: Medicare Other | Admitting: Family Medicine
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
## 2016-10-16 ENCOUNTER — Encounter: Payer: Self-pay | Admitting: Family Medicine

## 2016-10-16 ENCOUNTER — Ambulatory Visit (INDEPENDENT_AMBULATORY_CARE_PROVIDER_SITE_OTHER): Payer: Medicare Other | Admitting: Family Medicine

## 2016-10-16 ENCOUNTER — Ambulatory Visit (INDEPENDENT_AMBULATORY_CARE_PROVIDER_SITE_OTHER)
Admission: RE | Admit: 2016-10-16 | Discharge: 2016-10-16 | Disposition: A | Discharge status: Home / Self Care | Payer: Medicare Other | Source: Ambulatory Visit | Attending: Family Medicine | Admitting: Family Medicine

## 2016-10-16 VITALS — BP 142/94 | HR 91 | Ht 70.0 in | Wt 211.6 lb

## 2016-10-16 DIAGNOSIS — M47812 Spondylosis without myelopathy or radiculopathy, cervical region: Secondary | ICD-10-CM | POA: Diagnosis not present

## 2016-10-16 DIAGNOSIS — M542 Cervicalgia: Secondary | ICD-10-CM | POA: Diagnosis not present

## 2016-10-16 DIAGNOSIS — M999 Biomechanical lesion, unspecified: Secondary | ICD-10-CM | POA: Diagnosis not present

## 2016-10-16 DIAGNOSIS — G8220 Paraplegia, unspecified: Secondary | ICD-10-CM | POA: Insufficient documentation

## 2016-10-16 DIAGNOSIS — M5030 Other cervical disc degeneration, unspecified cervical region: Secondary | ICD-10-CM | POA: Diagnosis not present

## 2016-10-16 DIAGNOSIS — M2170 Unequal limb length (acquired), unspecified site: Secondary | ICD-10-CM | POA: Diagnosis not present

## 2016-10-16 NOTE — Assessment & Plan Note (Signed)
Patient does have degenerative cervical disc disease. Do believe that. Patient did respond fairly well to osteopathic manipulation. Given home exercises for ergonomics and posture. We discussed which activities doing which ones to potentially avoid. Patient continues to active oh otherwise. Worsening symptoms with the radicular symptoms and we'll consider further imaging such as an MRI. Patient come back and see me again in 3-4 weeks.

## 2016-10-16 NOTE — Patient Instructions (Addendum)
Good to see yo u Xray downstairs Ice is still good. Ice 20 minutes 2 times daily. Usually after activity and before bed. Gabapentin 200mg  at night When tightness in neck 2 tennis ball in tube sock and lay where head meet necks  Tennis ball in back right pocket with sitting.  Once weekly vitamin D New exercises for the hip Try another insole in the RIGHT shoe at all times to help the leg length  See me again in 4 weeks.

## 2016-10-16 NOTE — Assessment & Plan Note (Signed)
Discussed heel lift on right side.

## 2016-10-16 NOTE — Assessment & Plan Note (Signed)
Decision today to treat with OMT was based on Physical Exam  After verbal consent patient was treated with HVLA, ME, FPR techniques in cervical, thoracic, lumbar and sacral areas  Patient tolerated the procedure well with improvement in symptoms  Patient given exercises, stretches and lifestyle modifications  See medications in patient instructions if given  Patient will follow up in 3-4 weeks  

## 2016-11-03 ENCOUNTER — Encounter: Payer: Self-pay | Admitting: Family Medicine

## 2016-11-03 ENCOUNTER — Ambulatory Visit (INDEPENDENT_AMBULATORY_CARE_PROVIDER_SITE_OTHER)
Admission: RE | Admit: 2016-11-03 | Discharge: 2016-11-03 | Disposition: A | Discharge status: Home / Self Care | Payer: Medicare Other | Source: Ambulatory Visit | Attending: Family Medicine | Admitting: Family Medicine

## 2016-11-03 ENCOUNTER — Ambulatory Visit (INDEPENDENT_AMBULATORY_CARE_PROVIDER_SITE_OTHER): Payer: Medicare Other | Admitting: Family Medicine

## 2016-11-03 VITALS — BP 138/86 | HR 76 | Wt 214.2 lb

## 2016-11-03 DIAGNOSIS — M999 Biomechanical lesion, unspecified: Secondary | ICD-10-CM

## 2016-11-03 DIAGNOSIS — M545 Low back pain: Secondary | ICD-10-CM | POA: Diagnosis not present

## 2016-11-03 DIAGNOSIS — M5030 Other cervical disc degeneration, unspecified cervical region: Secondary | ICD-10-CM

## 2016-11-03 DIAGNOSIS — M25551 Pain in right hip: Secondary | ICD-10-CM | POA: Diagnosis not present

## 2016-11-03 DIAGNOSIS — G5701 Lesion of sciatic nerve, right lower limb: Secondary | ICD-10-CM
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
## 2016-11-03 NOTE — Assessment & Plan Note (Signed)
Piriformis Syndrome  Using an anatomical model, reviewed with the patient the structures involved and how they related to diagnosis. The patient indicated understanding.   The patient was given a handout from Dr. Arne Cleveland book "The Sports Medicine Patient Advisor" describing the anatomy and rehabilitation of the following condition: Piriformis Syndrome  Also given a handout with more extensive Piriformis stretching, hip flexor and abductor strengthening, ham stretching  Rec deep massage, explained self-massage with ball Patient will return to clinic again in worsening symptoms or consider injections over the sacroiliac joint or the piriformis itself.

## 2016-11-03 NOTE — Assessment & Plan Note (Signed)
Stable at the moment. No significant radicular symptoms that is consenting this at this time. Patient will continue to work on posture, we discussed the medications again in greater detail. Patient once to avoid anything specifically. Could be a candidate for epidurals needed. We'll consider advanced imaging if any worsening weakness occurs. Follow-up again in 4-6 weeks.

## 2016-11-03 NOTE — Assessment & Plan Note (Signed)
Decision today to treat with OMT was based on Physical Exam  After verbal consent patient was treated with HVLA, ME, FPR techniques in cervical, thoracic, lumbar and sacral areas  Patient tolerated the procedure well with improvement in symptoms  Patient given exercises, stretches and lifestyle modifications  See medications in patient instructions if given  Patient will follow up in 4 weeks 

## 2016-11-03 NOTE — Patient Instructions (Signed)
God to see you  No change in medicine  Xray downstairs today  See me again in 2-3 weeks and we will do injection in the SI joint or the piriformis Can do chiropractor care as well if needed.

## 2016-11-03 NOTE — Progress Notes (Signed)
Pre-visit discussion using our clinic review tool. No additional management support is needed unless otherwise documented below in the visit note.  
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
## 2016-11-18 ENCOUNTER — Ambulatory Visit (INDEPENDENT_AMBULATORY_CARE_PROVIDER_SITE_OTHER): Payer: Medicare Other | Admitting: Family Medicine

## 2016-11-18 ENCOUNTER — Encounter: Payer: Self-pay | Admitting: Family Medicine

## 2016-11-18 VITALS — BP 140/96 | HR 75 | Resp 16 | Wt 210.0 lb

## 2016-11-18 DIAGNOSIS — M5030 Other cervical disc degeneration, unspecified cervical region: Secondary | ICD-10-CM | POA: Diagnosis not present

## 2016-11-18 DIAGNOSIS — G5701 Lesion of sciatic nerve, right lower limb: Secondary | ICD-10-CM | POA: Diagnosis not present

## 2016-11-18 DIAGNOSIS — M9901 Segmental and somatic dysfunction of cervical region: Secondary | ICD-10-CM | POA: Diagnosis not present

## 2016-11-18 DIAGNOSIS — M9902 Segmental and somatic dysfunction of thoracic region: Secondary | ICD-10-CM | POA: Diagnosis not present

## 2016-11-18 DIAGNOSIS — M5414 Radiculopathy, thoracic region: Secondary | ICD-10-CM | POA: Diagnosis not present

## 2016-11-18 DIAGNOSIS — M999 Biomechanical lesion, unspecified: Secondary | ICD-10-CM | POA: Insufficient documentation

## 2016-11-18 DIAGNOSIS — M5412 Radiculopathy, cervical region: Secondary | ICD-10-CM | POA: Diagnosis not present

## 2016-11-18 DIAGNOSIS — M62830 Muscle spasm of back: Secondary | ICD-10-CM | POA: Diagnosis not present

## 2016-11-18 NOTE — Patient Instructions (Signed)
Good to see you  Alvera Singh is your friend.  Stay active we will get MRi  They will call you and schedule it I would then write you and depending we may do an epidural  IF we do an epidural I want to see you again 2 weeks AFTER the epidural.  Worsening symptoms give me a call.

## 2016-11-18 NOTE — Assessment & Plan Note (Signed)
Decision today to treat with OMT was based on Physical Exam  After verbal consent patient was treated with HVLA, ME, FPR techniques in  thoracic, lumbar and sacral areas avoid in neck secondary to radicular symptoms.  Patient tolerated the procedure well with improvement in symptoms  Patient given exercises, stretches and lifestyle modifications  See medications in patient instructions if given  Patient will follow up in 4 weeks

## 2016-11-18 NOTE — Assessment & Plan Note (Signed)
Patient does have known degenerative disc disease and does have cervical radiculopathy going down in the C6 distribution. At this point I do feel advance imaging is warranted. Mild weakness with grip strength around the index finger as well. Patient will have the MRI and depending on findings would be a candidate for possible epidural injection. Patient's livelihood is at stake.

## 2016-11-18 NOTE — Assessment & Plan Note (Signed)
Stable.       - Continue to monitor

## 2016-11-18 NOTE — Progress Notes (Signed)
Pre-visit discussion using our clinic review tool. No additional management support is needed unless otherwise documented below in the visit note.  

## 2016-11-19 DIAGNOSIS — M9901 Segmental and somatic dysfunction of cervical region: Secondary | ICD-10-CM | POA: Diagnosis not present

## 2016-11-19 DIAGNOSIS — M62830 Muscle spasm of back: Secondary | ICD-10-CM | POA: Diagnosis not present

## 2016-11-19 DIAGNOSIS — M9902 Segmental and somatic dysfunction of thoracic region: Secondary | ICD-10-CM | POA: Diagnosis not present

## 2016-11-19 DIAGNOSIS — M5414 Radiculopathy, thoracic region: Secondary | ICD-10-CM | POA: Diagnosis not present

## 2016-11-21 DIAGNOSIS — M9902 Segmental and somatic dysfunction of thoracic region: Secondary | ICD-10-CM | POA: Diagnosis not present

## 2016-11-21 DIAGNOSIS — M62830 Muscle spasm of back: Secondary | ICD-10-CM | POA: Diagnosis not present

## 2016-11-21 DIAGNOSIS — M5414 Radiculopathy, thoracic region: Secondary | ICD-10-CM | POA: Diagnosis not present

## 2016-11-21 DIAGNOSIS — M9901 Segmental and somatic dysfunction of cervical region: Secondary | ICD-10-CM | POA: Diagnosis not present

## 2016-11-24 ENCOUNTER — Encounter: Payer: Self-pay | Admitting: Family Medicine

## 2016-11-24 DIAGNOSIS — M62830 Muscle spasm of back: Secondary | ICD-10-CM | POA: Diagnosis not present

## 2016-11-24 DIAGNOSIS — M9901 Segmental and somatic dysfunction of cervical region: Secondary | ICD-10-CM | POA: Diagnosis not present

## 2016-11-24 DIAGNOSIS — M9902 Segmental and somatic dysfunction of thoracic region: Secondary | ICD-10-CM | POA: Diagnosis not present

## 2016-11-24 DIAGNOSIS — M5414 Radiculopathy, thoracic region: Secondary | ICD-10-CM | POA: Diagnosis not present

## 2016-11-25 ENCOUNTER — Other Ambulatory Visit: Payer: Self-pay

## 2016-11-25 DIAGNOSIS — M9902 Segmental and somatic dysfunction of thoracic region: Secondary | ICD-10-CM | POA: Diagnosis not present

## 2016-11-25 DIAGNOSIS — M9901 Segmental and somatic dysfunction of cervical region: Secondary | ICD-10-CM | POA: Diagnosis not present

## 2016-11-25 DIAGNOSIS — M62830 Muscle spasm of back: Secondary | ICD-10-CM | POA: Diagnosis not present

## 2016-11-25 DIAGNOSIS — M5414 Radiculopathy, thoracic region: Secondary | ICD-10-CM | POA: Diagnosis not present

## 2016-11-25 MED ORDER — TRAMADOL HCL 50 MG PO TABS: 50.0000 mg | ORAL_TABLET | Freq: Three times a day (TID) | ORAL | 0 refills | Status: DC | PRN

## 2016-11-26 DIAGNOSIS — M9901 Segmental and somatic dysfunction of cervical region: Secondary | ICD-10-CM | POA: Diagnosis not present

## 2016-11-26 DIAGNOSIS — M9902 Segmental and somatic dysfunction of thoracic region: Secondary | ICD-10-CM | POA: Diagnosis not present

## 2016-11-26 DIAGNOSIS — M62830 Muscle spasm of back: Secondary | ICD-10-CM | POA: Diagnosis not present

## 2016-11-26 DIAGNOSIS — M5414 Radiculopathy, thoracic region: Secondary | ICD-10-CM | POA: Diagnosis not present

## 2016-11-28 DIAGNOSIS — M62830 Muscle spasm of back: Secondary | ICD-10-CM | POA: Diagnosis not present

## 2016-11-28 DIAGNOSIS — M5414 Radiculopathy, thoracic region: Secondary | ICD-10-CM | POA: Diagnosis not present

## 2016-11-28 DIAGNOSIS — M9902 Segmental and somatic dysfunction of thoracic region: Secondary | ICD-10-CM | POA: Diagnosis not present

## 2016-11-28 DIAGNOSIS — M9901 Segmental and somatic dysfunction of cervical region: Secondary | ICD-10-CM | POA: Diagnosis not present

## 2016-12-01 ENCOUNTER — Encounter: Payer: Self-pay | Admitting: Family Medicine

## 2016-12-01 ENCOUNTER — Ambulatory Visit
Admission: RE | Admit: 2016-12-01 | Discharge: 2016-12-01 | Disposition: A | Discharge status: Home / Self Care | Payer: Medicare Other | Source: Ambulatory Visit | Attending: Family Medicine | Admitting: Family Medicine

## 2016-12-01 DIAGNOSIS — M4802 Spinal stenosis, cervical region: Secondary | ICD-10-CM | POA: Diagnosis not present

## 2016-12-01 DIAGNOSIS — M5412 Radiculopathy, cervical region: Secondary | ICD-10-CM

## 2016-12-02 ENCOUNTER — Other Ambulatory Visit: Payer: Self-pay

## 2016-12-02 DIAGNOSIS — M5412 Radiculopathy, cervical region: Secondary | ICD-10-CM

## 2016-12-02 DIAGNOSIS — M9901 Segmental and somatic dysfunction of cervical region: Secondary | ICD-10-CM | POA: Diagnosis not present

## 2016-12-02 DIAGNOSIS — M9902 Segmental and somatic dysfunction of thoracic region: Secondary | ICD-10-CM | POA: Diagnosis not present

## 2016-12-02 DIAGNOSIS — M62830 Muscle spasm of back: Secondary | ICD-10-CM | POA: Diagnosis not present

## 2016-12-02 DIAGNOSIS — M5414 Radiculopathy, thoracic region: Secondary | ICD-10-CM | POA: Diagnosis not present

## 2016-12-03 DIAGNOSIS — M9901 Segmental and somatic dysfunction of cervical region: Secondary | ICD-10-CM | POA: Diagnosis not present

## 2016-12-03 DIAGNOSIS — M5414 Radiculopathy, thoracic region: Secondary | ICD-10-CM | POA: Diagnosis not present

## 2016-12-03 DIAGNOSIS — M9902 Segmental and somatic dysfunction of thoracic region: Secondary | ICD-10-CM | POA: Diagnosis not present

## 2016-12-03 DIAGNOSIS — M62830 Muscle spasm of back: Secondary | ICD-10-CM | POA: Diagnosis not present

## 2016-12-05 DIAGNOSIS — M5414 Radiculopathy, thoracic region: Secondary | ICD-10-CM | POA: Diagnosis not present

## 2016-12-05 DIAGNOSIS — M9901 Segmental and somatic dysfunction of cervical region: Secondary | ICD-10-CM | POA: Diagnosis not present

## 2016-12-05 DIAGNOSIS — M9902 Segmental and somatic dysfunction of thoracic region: Secondary | ICD-10-CM | POA: Diagnosis not present

## 2016-12-05 DIAGNOSIS — M62830 Muscle spasm of back: Secondary | ICD-10-CM | POA: Diagnosis not present

## 2016-12-08 DIAGNOSIS — M62830 Muscle spasm of back: Secondary | ICD-10-CM | POA: Diagnosis not present

## 2016-12-08 DIAGNOSIS — M5414 Radiculopathy, thoracic region: Secondary | ICD-10-CM | POA: Diagnosis not present

## 2016-12-08 DIAGNOSIS — M9902 Segmental and somatic dysfunction of thoracic region: Secondary | ICD-10-CM | POA: Diagnosis not present

## 2016-12-08 DIAGNOSIS — M9901 Segmental and somatic dysfunction of cervical region: Secondary | ICD-10-CM | POA: Diagnosis not present

## 2016-12-10 ENCOUNTER — Ambulatory Visit
Admission: RE | Admit: 2016-12-10 | Discharge: 2016-12-10 | Disposition: A | Discharge status: Home / Self Care | Payer: Medicare Other | Source: Ambulatory Visit | Attending: Family Medicine | Admitting: Family Medicine

## 2016-12-10 DIAGNOSIS — M542 Cervicalgia: Secondary | ICD-10-CM | POA: Diagnosis not present

## 2016-12-10 DIAGNOSIS — M5412 Radiculopathy, cervical region: Secondary | ICD-10-CM

## 2016-12-10 MED ORDER — TRIAMCINOLONE ACETONIDE 40 MG/ML IJ SUSP (RADIOLOGY)
60.0000 mg | Freq: Once | INTRAMUSCULAR | Status: AC
  Administered 2016-12-10: 60 mg via EPIDURAL

## 2016-12-10 MED ORDER — IOPAMIDOL (ISOVUE-M 300) INJECTION 61%
1.0000 mL | Freq: Once | INTRAMUSCULAR | Status: AC | PRN
  Administered 2016-12-10: 1 mL via INTRATHECAL

## 2016-12-10 NOTE — Discharge Instructions (Signed)

## 2016-12-16 DIAGNOSIS — M9901 Segmental and somatic dysfunction of cervical region: Secondary | ICD-10-CM | POA: Diagnosis not present

## 2016-12-16 DIAGNOSIS — M9902 Segmental and somatic dysfunction of thoracic region: Secondary | ICD-10-CM | POA: Diagnosis not present

## 2016-12-16 DIAGNOSIS — M5414 Radiculopathy, thoracic region: Secondary | ICD-10-CM | POA: Diagnosis not present

## 2016-12-16 DIAGNOSIS — M62830 Muscle spasm of back: Secondary | ICD-10-CM | POA: Diagnosis not present
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
## 2016-12-17 ENCOUNTER — Ambulatory Visit (INDEPENDENT_AMBULATORY_CARE_PROVIDER_SITE_OTHER): Payer: Medicare Other | Admitting: Family Medicine

## 2016-12-17 ENCOUNTER — Encounter: Payer: Self-pay | Admitting: Family Medicine

## 2016-12-17 VITALS — BP 132/84 | HR 76 | Ht 70.0 in | Wt 204.0 lb

## 2016-12-17 DIAGNOSIS — M5412 Radiculopathy, cervical region: Secondary | ICD-10-CM | POA: Diagnosis not present

## 2016-12-17 DIAGNOSIS — M5030 Other cervical disc degeneration, unspecified cervical region: Secondary | ICD-10-CM

## 2016-12-17 NOTE — Assessment & Plan Note (Signed)
Known degenerative disc disease. Radicular symptoms noted. Discussed with patient at great length. Patient has failed all conservative therapy but did have some response even though it was short with the epidural. Patient will be sent to neurosurgery for further evaluation. This is affecting patient's job as well as having some weakness and now having a decrease in the deep tendon reflex on the right side. Patient call if any questions. We discussed that the alternative would be another injection which patient declined.  Spent  25 minutes with patient face-to-face and had greater than 50% of counseling including as described above in assessment and plan.

## 2016-12-17 NOTE — Patient Instructions (Signed)
Good to see you  We will get you in with [facility] or [place] they will give you a call soon.  Talk to them about your back and consider seeing Dr. Maryjean Ka for injections in the back  YOu know where I am if you need me

## 2016-12-22 ENCOUNTER — Encounter: Payer: Self-pay | Admitting: Family Medicine

## 2017-01-06 ENCOUNTER — Other Ambulatory Visit: Payer: Self-pay | Admitting: Neurosurgery

## 2017-01-06 DIAGNOSIS — M5412 Radiculopathy, cervical region: Secondary | ICD-10-CM | POA: Diagnosis not present

## 2017-01-06 DIAGNOSIS — M4722 Other spondylosis with radiculopathy, cervical region: Secondary | ICD-10-CM | POA: Diagnosis not present

## 2017-01-06 DIAGNOSIS — M542 Cervicalgia: Secondary | ICD-10-CM | POA: Diagnosis not present

## 2017-01-06 DIAGNOSIS — R29898 Other symptoms and signs involving the musculoskeletal system: Secondary | ICD-10-CM | POA: Insufficient documentation

## 2017-01-06 DIAGNOSIS — M5030 Other cervical disc degeneration, unspecified cervical region: Secondary | ICD-10-CM | POA: Diagnosis not present

## 2017-01-06 DIAGNOSIS — M5020 Other cervical disc displacement, unspecified cervical region: Secondary | ICD-10-CM | POA: Diagnosis not present

## 2017-01-07 DIAGNOSIS — M9901 Segmental and somatic dysfunction of cervical region: Secondary | ICD-10-CM | POA: Diagnosis not present

## 2017-01-07 DIAGNOSIS — M62830 Muscle spasm of back: Secondary | ICD-10-CM | POA: Diagnosis not present

## 2017-01-07 DIAGNOSIS — M5414 Radiculopathy, thoracic region: Secondary | ICD-10-CM | POA: Diagnosis not present

## 2017-01-07 DIAGNOSIS — M9902 Segmental and somatic dysfunction of thoracic region: Secondary | ICD-10-CM | POA: Diagnosis not present

## 2017-01-22 DIAGNOSIS — M9902 Segmental and somatic dysfunction of thoracic region: Secondary | ICD-10-CM | POA: Diagnosis not present

## 2017-01-22 DIAGNOSIS — M9901 Segmental and somatic dysfunction of cervical region: Secondary | ICD-10-CM | POA: Diagnosis not present

## 2017-01-22 DIAGNOSIS — M5414 Radiculopathy, thoracic region: Secondary | ICD-10-CM | POA: Diagnosis not present

## 2017-01-22 DIAGNOSIS — M62830 Muscle spasm of back: Secondary | ICD-10-CM | POA: Diagnosis not present

## 2017-02-17 DIAGNOSIS — E230 Hypopituitarism: Secondary | ICD-10-CM | POA: Diagnosis not present

## 2017-02-17 DIAGNOSIS — E039 Hypothyroidism, unspecified: Secondary | ICD-10-CM | POA: Diagnosis not present

## 2017-02-20 DIAGNOSIS — E063 Autoimmune thyroiditis: Secondary | ICD-10-CM | POA: Diagnosis not present

## 2017-02-20 DIAGNOSIS — E039 Hypothyroidism, unspecified: Secondary | ICD-10-CM | POA: Diagnosis not present

## 2017-02-20 DIAGNOSIS — E291 Testicular hypofunction: Secondary | ICD-10-CM | POA: Diagnosis not present

## 2017-03-17 ENCOUNTER — Encounter (HOSPITAL_COMMUNITY)
Admission: RE | Admit: 2017-03-17 | Discharge: 2017-03-17 | Disposition: A | Discharge status: Home / Self Care | Payer: Medicare Other | Source: Ambulatory Visit | Attending: Neurosurgery | Admitting: Neurosurgery

## 2017-03-17 ENCOUNTER — Other Ambulatory Visit (HOSPITAL_COMMUNITY): Payer: Self-pay | Admitting: *Deleted

## 2017-03-17 ENCOUNTER — Encounter (HOSPITAL_COMMUNITY): Payer: Self-pay

## 2017-03-17 DIAGNOSIS — M5412 Radiculopathy, cervical region: Secondary | ICD-10-CM | POA: Diagnosis not present

## 2017-03-17 HISTORY — DX: Testicular hypofunction: E29.1

## 2017-03-17 LAB — CBC
HCT: 47.5 % (ref 39.0–52.0)
Hemoglobin: 16.4 g/dL (ref 13.0–17.0)
MCH: 30.5 pg (ref 26.0–34.0)
MCHC: 34.5 g/dL (ref 30.0–36.0)
MCV: 88.3 fL (ref 78.0–100.0)
PLATELETS: 233 10*3/uL (ref 150–400)
RBC: 5.38 MIL/uL (ref 4.22–5.81)
RDW: 12.5 % (ref 11.5–15.5)
WBC: 7 10*3/uL (ref 4.0–10.5)

## 2017-03-17 LAB — BASIC METABOLIC PANEL
Anion gap: 7 (ref 5–15)
BUN: 16 mg/dL (ref 6–20)
CHLORIDE: 105 mmol/L (ref 101–111)
CO2: 27 mmol/L (ref 22–32)
CREATININE: 1.47 mg/dL — AB (ref 0.61–1.24)
Calcium: 8.9 mg/dL (ref 8.9–10.3)
GFR calc Af Amer: >60 mL/min (ref >60)
GFR, EST NON AFRICAN AMERICAN: 57 mL/min — AB (ref >60)
Glucose, Bld: 88 mg/dL (ref 65–99)
Potassium: 4.5 mmol/L (ref 3.5–5.1)
SODIUM: 139 mmol/L (ref 135–145)

## 2017-03-17 LAB — SURGICAL PCR SCREEN
MRSA, PCR: NEGATIVE
Staphylococcus aureus: NEGATIVE

## 2017-03-17 NOTE — Pre-Procedure Instructions (Signed)
Timothy Powell  03/17/2017      [facility] [address], [place] [address] [place] [address] Phone: [phone] Fax: [phone]  [facility], [place], [place] - [address] [place] [address] Phone: [phone] Fax: [phone]  [id number] [place], [place] [address] [place] [address] Phone: [phone] Fax: [phone]    Your procedure is scheduled on August 20,2018   Monday   Report to [facility] Admitting at 5:30 A.M.   Call this number if you have problems the morning of surgery:  [phone]   Remember:  Do not eat food or drink liquids after midnight.   Take these medicines the morning of surgery with A SIP OF WATER Tramadol(Ultram) if needed   STOP ASPIRIN,ANTIINFLAMATORIES (IBUPROFEN,ALEVE,MOTRIN,ADVIL,GOODY'S POWDERS),HERBAL SUPPLEMENTS,FISH OIL,AND VITAMINS 5-7 DAYS PRIOR TO SURGERY   Do not wear jewelry, make-up or nail polish.  Do not wear lotions, powders, or perfumes, or deoderant.  Do not shave 48 hours prior to surgery.  Men may shave face and neck.  Do not bring valuables to the hospital.  [facility] is not responsible for any belongings or valuables.  Contacts, dentures or bridgework may not be worn into surgery.  Leave your suitcase in the car.  After surgery it may be brought to your room.  For patients admitted to the hospital, discharge time will be determined by your treatment team.  Patients discharged the day of surgery will not be allowed to drive home.    Special Instructions:  - Preparing for Surgery  Before surgery, you can play an important role.  Because skin is not sterile, your skin needs to be as free of germs as possible.  You can reduce the number of germs on you skin by washing with CHG (chlorahexidine gluconate) soap before surgery.  CHG is an antiseptic  cleaner which kills germs and bonds with the skin to continue killing germs even after washing.  Please DO NOT use if you have an allergy to CHG or antibacterial soaps.  If your skin becomes reddened/irritated stop using the CHG and inform your nurse when you arrive at Short Stay.  Do not shave (including legs and underarms) for at least 48 hours prior to the first CHG shower.  You may shave your face.  Please follow these instructions carefully:   1.  Shower with CHG Soap the night before surgery and the   morning of Surgery.  2.  If you choose to wash your hair, wash your hair first as usual with your normal shampoo.  3.  After you shampoo, rinse your hair and body thoroughly to remove the  Shampoo.  4.  Use CHG as you would any other liquid soap.  You can apply chg directly  to the skin and wash gently with scrungie or a clean washcloth.  5.  Apply the CHG Soap to your body ONLY FROM THE NECK DOWN.   Do not use on open wounds or open sores.  Avoid contact with your eyes,  ears, mouth and genitals (private parts).  Wash genitals (private parts) with your normal soap.  6.  Wash thoroughly, paying special attention to the area where your surgery will be performed.  7.  Thoroughly rinse your body with warm water from the neck down.  8.  DO NOT shower/wash with your normal soap after using and rinsing o  the CHG Soap.  9.  Pat yourself dry with a clean towel.            10.  Wear clean pajamas.            11.  Place clean sheets on your bed the night of your first shower and do not sleep with pets.  Day of Surgery  Do not apply any lotions/deodorants the morning of surgery.  Please wear clean clothes to the hospital/surgery center.   Please read over the following fact sheets that you were given. MRSA Information and Surgical Site Infection Prevention

## 2017-03-20 DIAGNOSIS — B353 Tinea pedis: Secondary | ICD-10-CM | POA: Diagnosis not present

## 2017-03-20 DIAGNOSIS — L570 Actinic keratosis: Secondary | ICD-10-CM | POA: Diagnosis not present

## 2017-03-20 DIAGNOSIS — D225 Melanocytic nevi of trunk: Secondary | ICD-10-CM | POA: Diagnosis not present

## 2017-03-20 DIAGNOSIS — L821 Other seborrheic keratosis: Secondary | ICD-10-CM | POA: Diagnosis not present

## 2017-03-20 DIAGNOSIS — L814 Other melanin hyperpigmentation: Secondary | ICD-10-CM | POA: Diagnosis not present

## 2017-03-23 ENCOUNTER — Encounter (HOSPITAL_COMMUNITY)
Admission: RE | Disposition: A | Discharge status: Home / Self Care | Payer: Self-pay | Source: Ambulatory Visit | Attending: Neurosurgery

## 2017-03-23 ENCOUNTER — Inpatient Hospital Stay (HOSPITAL_COMMUNITY): Payer: Medicare Other

## 2017-03-23 ENCOUNTER — Observation Stay (HOSPITAL_COMMUNITY)
Admission: RE | Admit: 2017-03-23 | Discharge: 2017-03-24 | Disposition: A | Discharge status: Home / Self Care | Payer: Medicare Other | Source: Ambulatory Visit | Attending: Neurosurgery | Admitting: Neurosurgery

## 2017-03-23 ENCOUNTER — Inpatient Hospital Stay (HOSPITAL_COMMUNITY): Payer: Medicare Other | Admitting: Anesthesiology

## 2017-03-23 ENCOUNTER — Encounter (HOSPITAL_COMMUNITY): Payer: Self-pay | Admitting: Surgery

## 2017-03-23 DIAGNOSIS — M50221 Other cervical disc displacement at C4-C5 level: Secondary | ICD-10-CM | POA: Diagnosis not present

## 2017-03-23 DIAGNOSIS — M50121 Cervical disc disorder at C4-C5 level with radiculopathy: Secondary | ICD-10-CM | POA: Diagnosis not present

## 2017-03-23 DIAGNOSIS — F419 Anxiety disorder, unspecified: Secondary | ICD-10-CM | POA: Diagnosis not present

## 2017-03-23 DIAGNOSIS — M4802 Spinal stenosis, cervical region: Secondary | ICD-10-CM | POA: Diagnosis not present

## 2017-03-23 DIAGNOSIS — M5010 Cervical disc disorder with radiculopathy, unspecified cervical region: Secondary | ICD-10-CM | POA: Diagnosis not present

## 2017-03-23 DIAGNOSIS — M4722 Other spondylosis with radiculopathy, cervical region: Secondary | ICD-10-CM | POA: Insufficient documentation

## 2017-03-23 DIAGNOSIS — M1990 Unspecified osteoarthritis, unspecified site: Secondary | ICD-10-CM | POA: Diagnosis not present

## 2017-03-23 DIAGNOSIS — M50223 Other cervical disc displacement at C6-C7 level: Secondary | ICD-10-CM | POA: Diagnosis not present

## 2017-03-23 DIAGNOSIS — M50123 Cervical disc disorder at C6-C7 level with radiculopathy: Secondary | ICD-10-CM | POA: Diagnosis not present

## 2017-03-23 DIAGNOSIS — Z79899 Other long term (current) drug therapy: Secondary | ICD-10-CM | POA: Insufficient documentation

## 2017-03-23 DIAGNOSIS — M4322 Fusion of spine, cervical region: Secondary | ICD-10-CM | POA: Diagnosis not present

## 2017-03-23 DIAGNOSIS — M50122 Cervical disc disorder at C5-C6 level with radiculopathy: Secondary | ICD-10-CM | POA: Diagnosis not present

## 2017-03-23 DIAGNOSIS — M5020 Other cervical disc displacement, unspecified cervical region: Secondary | ICD-10-CM | POA: Diagnosis present

## 2017-03-23 DIAGNOSIS — Z419 Encounter for procedure for purposes other than remedying health state, unspecified: Secondary | ICD-10-CM

## 2017-03-23 HISTORY — PX: ANTERIOR CERVICAL DECOMP/DISCECTOMY FUSION: SHX1161

## 2017-03-23 SURGERY — ANTERIOR CERVICAL DECOMPRESSION/DISCECTOMY FUSION 3 LEVELS
Anesthesia: General | Site: Spine Cervical

## 2017-03-23 MED ORDER — CHLORHEXIDINE GLUCONATE CLOTH 2 % EX PADS: 6.0000 | MEDICATED_PAD | Freq: Once | CUTANEOUS | Status: DC

## 2017-03-23 MED ORDER — FENTANYL CITRATE (PF) 250 MCG/5ML IJ SOLN
INTRAMUSCULAR | Status: AC
Start: 2017-03-23 — End: 2017-03-23
  Filled 2017-03-23: qty 5

## 2017-03-23 MED ORDER — CYCLOBENZAPRINE HCL 5 MG PO TABS
5.0000 mg | ORAL_TABLET | Freq: Three times a day (TID) | ORAL | Status: DC | PRN
  Administered 2017-03-23 (×2): 10 mg via ORAL
  Filled 2017-03-23 (×2): qty 2

## 2017-03-23 MED ORDER — SUGAMMADEX SODIUM 200 MG/2ML IV SOLN
INTRAVENOUS | Status: DC | PRN
  Administered 2017-03-23: 185 mg via INTRAVENOUS

## 2017-03-23 MED ORDER — FENTANYL CITRATE (PF) 100 MCG/2ML IJ SOLN
INTRAMUSCULAR | Status: DC | PRN
  Administered 2017-03-23: 25 ug via INTRAVENOUS
  Administered 2017-03-23 (×4): 50 ug via INTRAVENOUS
  Administered 2017-03-23: 25 ug via INTRAVENOUS
  Administered 2017-03-23: 50 ug via INTRAVENOUS

## 2017-03-23 MED ORDER — THROMBIN 5000 UNITS EX SOLR
OROMUCOSAL | Status: DC | PRN
  Administered 2017-03-23: 5 mL via TOPICAL

## 2017-03-23 MED ORDER — SODIUM CHLORIDE 0.9 % IR SOLN
Status: DC | PRN
  Administered 2017-03-23: 500 mL

## 2017-03-23 MED ORDER — DEXAMETHASONE SODIUM PHOSPHATE 10 MG/ML IJ SOLN
INTRAMUSCULAR | Status: AC
  Filled 2017-03-23: qty 1

## 2017-03-23 MED ORDER — THROMBIN 5000 UNITS EX SOLR
CUTANEOUS | Status: AC
  Filled 2017-03-23: qty 5000

## 2017-03-23 MED ORDER — ONDANSETRON HCL 4 MG/2ML IJ SOLN: 4.0000 mg | Freq: Four times a day (QID) | INTRAMUSCULAR | Status: DC | PRN

## 2017-03-23 MED ORDER — DIAZEPAM 5 MG/ML IJ SOLN
INTRAMUSCULAR | Status: AC
Start: 2017-03-23 — End: 2017-03-23
  Administered 2017-03-23: 2.5 mg via INTRAVENOUS
  Filled 2017-03-23: qty 2

## 2017-03-23 MED ORDER — PHENYLEPHRINE 40 MCG/ML (10ML) SYRINGE FOR IV PUSH (FOR BLOOD PRESSURE SUPPORT)
PREFILLED_SYRINGE | INTRAVENOUS | Status: AC
  Filled 2017-03-23: qty 10

## 2017-03-23 MED ORDER — HYDROMORPHONE HCL 1 MG/ML IJ SOLN
0.2500 mg | INTRAMUSCULAR | Status: DC | PRN
  Administered 2017-03-23 (×2): 0.5 mg via INTRAVENOUS

## 2017-03-23 MED ORDER — ALPRAZOLAM 0.25 MG PO TABS: 0.2500 mg | ORAL_TABLET | Freq: Every evening | ORAL | Status: DC | PRN

## 2017-03-23 MED ORDER — EPHEDRINE SULFATE-NACL 50-0.9 MG/10ML-% IV SOSY
PREFILLED_SYRINGE | INTRAVENOUS | Status: DC | PRN
  Administered 2017-03-23 (×2): 5 mg via INTRAVENOUS

## 2017-03-23 MED ORDER — FLEET ENEMA 7-19 GM/118ML RE ENEM: 1.0000 | ENEMA | Freq: Once | RECTAL | Status: DC | PRN

## 2017-03-23 MED ORDER — MIDAZOLAM HCL 2 MG/2ML IJ SOLN
INTRAMUSCULAR | Status: AC
  Filled 2017-03-23: qty 2

## 2017-03-23 MED ORDER — HYDROXYZINE HCL 25 MG PO TABS: 50.0000 mg | ORAL_TABLET | ORAL | Status: DC | PRN

## 2017-03-23 MED ORDER — HYDROCODONE-ACETAMINOPHEN 5-325 MG PO TABS
1.0000 | ORAL_TABLET | ORAL | Status: DC | PRN
  Administered 2017-03-23 (×3): 2 via ORAL
  Administered 2017-03-24: 1 via ORAL
  Administered 2017-03-24: 2 via ORAL
  Filled 2017-03-23: qty 2
  Filled 2017-03-23: qty 1
  Filled 2017-03-23 (×3): qty 2

## 2017-03-23 MED ORDER — OXYCODONE HCL 5 MG PO TABS: 5.0000 mg | ORAL_TABLET | Freq: Once | ORAL | Status: DC | PRN

## 2017-03-23 MED ORDER — MENTHOL 3 MG MT LOZG: 1.0000 | LOZENGE | OROMUCOSAL | Status: DC | PRN

## 2017-03-23 MED ORDER — EPHEDRINE 5 MG/ML INJ
INTRAVENOUS | Status: AC
  Filled 2017-03-23: qty 10

## 2017-03-23 MED ORDER — LIDOCAINE 2% (20 MG/ML) 5 ML SYRINGE
INTRAMUSCULAR | Status: AC
  Filled 2017-03-23: qty 5

## 2017-03-23 MED ORDER — MIDAZOLAM HCL 5 MG/5ML IJ SOLN
INTRAMUSCULAR | Status: DC | PRN
  Administered 2017-03-23: 2 mg via INTRAVENOUS

## 2017-03-23 MED ORDER — PROPOFOL 10 MG/ML IV BOLUS
INTRAVENOUS | Status: DC | PRN
  Administered 2017-03-23: 200 mg via INTRAVENOUS

## 2017-03-23 MED ORDER — DEXTROSE-NACL 5-0.45 % IV SOLN: INTRAVENOUS | Status: DC

## 2017-03-23 MED ORDER — ROCURONIUM BROMIDE 100 MG/10ML IV SOLN
INTRAVENOUS | Status: DC | PRN
  Administered 2017-03-23: 10 mg via INTRAVENOUS
  Administered 2017-03-23: 5 mg via INTRAVENOUS
  Administered 2017-03-23: 60 mg via INTRAVENOUS

## 2017-03-23 MED ORDER — ROCURONIUM BROMIDE 10 MG/ML (PF) SYRINGE
PREFILLED_SYRINGE | INTRAVENOUS | Status: AC
  Filled 2017-03-23: qty 5

## 2017-03-23 MED ORDER — ACETAMINOPHEN 10 MG/ML IV SOLN
INTRAVENOUS | Status: DC | PRN
  Administered 2017-03-23: 1000 mg via INTRAVENOUS

## 2017-03-23 MED ORDER — LIDOCAINE HCL (CARDIAC) 20 MG/ML IV SOLN
INTRAVENOUS | Status: DC | PRN
  Administered 2017-03-23: 80 mg via INTRAVENOUS

## 2017-03-23 MED ORDER — BISACODYL 10 MG RE SUPP
10.0000 mg | Freq: Every day | RECTAL | Status: DC | PRN
Start: 2017-03-23 — End: 2017-03-24

## 2017-03-23 MED ORDER — ALUM & MAG HYDROXIDE-SIMETH 200-200-20 MG/5ML PO SUSP: 30.0000 mL | Freq: Four times a day (QID) | ORAL | Status: DC | PRN

## 2017-03-23 MED ORDER — ONDANSETRON HCL 4 MG PO TABS: 4.0000 mg | ORAL_TABLET | Freq: Four times a day (QID) | ORAL | Status: DC | PRN

## 2017-03-23 MED ORDER — DEXAMETHASONE SODIUM PHOSPHATE 10 MG/ML IJ SOLN
INTRAMUSCULAR | Status: DC | PRN
  Administered 2017-03-23: 10 mg via INTRAVENOUS

## 2017-03-23 MED ORDER — LIDOCAINE-EPINEPHRINE 1 %-1:100000 IJ SOLN
INTRAMUSCULAR | Status: AC
  Filled 2017-03-23: qty 1

## 2017-03-23 MED ORDER — LIDOCAINE-EPINEPHRINE 1 %-1:100000 IJ SOLN
INTRAMUSCULAR | Status: DC | PRN
  Administered 2017-03-23: 8.5 mL

## 2017-03-23 MED ORDER — ACETAMINOPHEN 650 MG RE SUPP: 650.0000 mg | RECTAL | Status: DC | PRN

## 2017-03-23 MED ORDER — HYDROMORPHONE HCL 1 MG/ML IJ SOLN
INTRAMUSCULAR | Status: AC
  Administered 2017-03-23: 0.5 mg via INTRAVENOUS
  Filled 2017-03-23: qty 1

## 2017-03-23 MED ORDER — ACETAMINOPHEN 10 MG/ML IV SOLN
INTRAVENOUS | Status: AC
  Filled 2017-03-23: qty 100

## 2017-03-23 MED ORDER — LACTATED RINGERS IV SOLN
INTRAVENOUS | Status: DC | PRN
  Administered 2017-03-23 (×2): via INTRAVENOUS

## 2017-03-23 MED ORDER — DIAZEPAM 5 MG/ML IJ SOLN
2.5000 mg | INTRAMUSCULAR | Status: AC
  Administered 2017-03-23: 2.5 mg via INTRAVENOUS
  Filled 2017-03-23: qty 2

## 2017-03-23 MED ORDER — SODIUM CHLORIDE 0.9% FLUSH: 3.0000 mL | INTRAVENOUS | Status: DC | PRN

## 2017-03-23 MED ORDER — OXYCODONE HCL 5 MG/5ML PO SOLN: 5.0000 mg | Freq: Once | ORAL | Status: DC | PRN

## 2017-03-23 MED ORDER — SUCCINYLCHOLINE CHLORIDE 200 MG/10ML IV SOSY
PREFILLED_SYRINGE | INTRAVENOUS | Status: AC
  Filled 2017-03-23: qty 10

## 2017-03-23 MED ORDER — SUGAMMADEX SODIUM 200 MG/2ML IV SOLN
INTRAVENOUS | Status: AC
  Filled 2017-03-23: qty 2

## 2017-03-23 MED ORDER — BUPIVACAINE HCL (PF) 0.5 % IJ SOLN
INTRAMUSCULAR | Status: DC | PRN
  Administered 2017-03-23: 8.5 mL

## 2017-03-23 MED ORDER — CEFAZOLIN SODIUM-DEXTROSE 2-4 GM/100ML-% IV SOLN
2.0000 g | INTRAVENOUS | Status: AC
  Administered 2017-03-23: 2 g via INTRAVENOUS
  Filled 2017-03-23: qty 100

## 2017-03-23 MED ORDER — ONDANSETRON HCL 4 MG/2ML IJ SOLN
INTRAMUSCULAR | Status: AC
  Filled 2017-03-23: qty 2

## 2017-03-23 MED ORDER — 0.9 % SODIUM CHLORIDE (POUR BTL) OPTIME
TOPICAL | Status: DC | PRN
  Administered 2017-03-23: 1000 mL

## 2017-03-23 MED ORDER — ONDANSETRON HCL 4 MG/2ML IJ SOLN
INTRAMUSCULAR | Status: DC | PRN
  Administered 2017-03-23: 4 mg via INTRAVENOUS

## 2017-03-23 MED ORDER — KETOROLAC TROMETHAMINE 30 MG/ML IJ SOLN
30.0000 mg | Freq: Once | INTRAMUSCULAR | Status: AC
  Administered 2017-03-23: 30 mg via INTRAVENOUS

## 2017-03-23 MED ORDER — FENTANYL CITRATE (PF) 250 MCG/5ML IJ SOLN
INTRAMUSCULAR | Status: AC
  Filled 2017-03-23: qty 5

## 2017-03-23 MED ORDER — KETOROLAC TROMETHAMINE 30 MG/ML IJ SOLN
INTRAMUSCULAR | Status: AC
  Filled 2017-03-23: qty 1

## 2017-03-23 MED ORDER — PROPOFOL 10 MG/ML IV BOLUS
INTRAVENOUS | Status: AC
  Filled 2017-03-23: qty 40

## 2017-03-23 MED ORDER — THROMBIN 20000 UNITS EX SOLR
CUTANEOUS | Status: DC | PRN
  Administered 2017-03-23: 20 mL via TOPICAL

## 2017-03-23 MED ORDER — ACETAMINOPHEN 325 MG PO TABS: 650.0000 mg | ORAL_TABLET | ORAL | Status: DC | PRN

## 2017-03-23 MED ORDER — MORPHINE SULFATE (PF) 4 MG/ML IV SOLN: 4.0000 mg | INTRAVENOUS | Status: DC | PRN

## 2017-03-23 MED ORDER — THROMBIN 20000 UNITS EX SOLR
CUTANEOUS | Status: AC
  Filled 2017-03-23: qty 20000

## 2017-03-23 MED ORDER — KETOROLAC TROMETHAMINE 30 MG/ML IJ SOLN
30.0000 mg | Freq: Four times a day (QID) | INTRAMUSCULAR | Status: DC
  Administered 2017-03-23 – 2017-03-24 (×3): 30 mg via INTRAVENOUS
  Filled 2017-03-23 (×3): qty 1

## 2017-03-23 MED ORDER — HYDROXYZINE HCL 50 MG/ML IM SOLN: 50.0000 mg | INTRAMUSCULAR | Status: DC | PRN

## 2017-03-23 MED ORDER — BUPIVACAINE HCL (PF) 0.5 % IJ SOLN
INTRAMUSCULAR | Status: AC
  Filled 2017-03-23: qty 30

## 2017-03-23 MED ORDER — PHENOL 1.4 % MT LIQD
1.0000 | OROMUCOSAL | Status: DC | PRN
  Administered 2017-03-23: 1 via OROMUCOSAL
  Filled 2017-03-23: qty 177

## 2017-03-23 MED ORDER — SODIUM CHLORIDE 0.9% FLUSH
3.0000 mL | Freq: Two times a day (BID) | INTRAVENOUS | Status: DC
  Administered 2017-03-23: 3 mL via INTRAVENOUS

## 2017-03-23 MED ORDER — MAGNESIUM HYDROXIDE 400 MG/5ML PO SUSP: 30.0000 mL | Freq: Every day | ORAL | Status: DC | PRN

## 2017-03-23 SURGICAL SUPPLY — 60 items
ALLOGRAFT CA 6X14X11 (Bone Implant) ×6 IMPLANT
BAG DECANTER FOR FLEXI CONT (MISCELLANEOUS) ×2 IMPLANT
BIT DRILL 13 (BIT) ×2 IMPLANT
BIT DRILL NEURO 2X3.1 SFT TUCH (MISCELLANEOUS) ×1 IMPLANT
BLADE ULTRA TIP 2M (BLADE) ×2 IMPLANT
CANISTER SUCT 3000ML PPV (MISCELLANEOUS) ×2 IMPLANT
CARTRIDGE OIL MAESTRO DRILL (MISCELLANEOUS) ×1 IMPLANT
COVER MAYO STAND STRL (DRAPES) ×2 IMPLANT
DECANTER SPIKE VIAL GLASS SM (MISCELLANEOUS) ×2 IMPLANT
DERMABOND ADVANCED (GAUZE/BANDAGES/DRESSINGS) ×1
DERMABOND ADVANCED .7 DNX12 (GAUZE/BANDAGES/DRESSINGS) ×1 IMPLANT
DIFFUSER DRILL AIR PNEUMATIC (MISCELLANEOUS) ×2 IMPLANT
DRAPE HALF SHEET 40X57 (DRAPES) ×2 IMPLANT
DRAPE LAPAROTOMY 100X72 PEDS (DRAPES) ×2 IMPLANT
DRAPE MICROSCOPE LEICA (MISCELLANEOUS) ×2 IMPLANT
DRAPE POUCH INSTRU U-SHP 10X18 (DRAPES) ×2 IMPLANT
DRILL NEURO 2X3.1 SOFT TOUCH (MISCELLANEOUS) ×2
ELECT COATED BLADE 2.86 ST (ELECTRODE) ×2 IMPLANT
ELECT REM PT RETURN 9FT ADLT (ELECTROSURGICAL) ×2
ELECTRODE REM PT RTRN 9FT ADLT (ELECTROSURGICAL) ×1 IMPLANT
GAUZE SPONGE 4X4 16PLY XRAY LF (GAUZE/BANDAGES/DRESSINGS) ×2 IMPLANT
GLOVE BIO SURGEON STRL SZ8 (GLOVE) ×2 IMPLANT
GLOVE BIO SURGEON STRL SZ8.5 (GLOVE) ×2 IMPLANT
GLOVE BIOGEL PI IND STRL 8 (GLOVE) ×2 IMPLANT
GLOVE BIOGEL PI INDICATOR 8 (GLOVE) ×2
GLOVE ECLIPSE 7.5 STRL STRAW (GLOVE) ×4 IMPLANT
GLOVE EXAM NITRILE LRG STRL (GLOVE) IMPLANT
GLOVE EXAM NITRILE XL STR (GLOVE) IMPLANT
GLOVE EXAM NITRILE XS STR PU (GLOVE) IMPLANT
GOWN STRL REUS W/ TWL LRG LVL3 (GOWN DISPOSABLE) IMPLANT
GOWN STRL REUS W/ TWL XL LVL3 (GOWN DISPOSABLE) ×1 IMPLANT
GOWN STRL REUS W/TWL 2XL LVL3 (GOWN DISPOSABLE) ×4 IMPLANT
GOWN STRL REUS W/TWL LRG LVL3 (GOWN DISPOSABLE)
GOWN STRL REUS W/TWL XL LVL3 (GOWN DISPOSABLE) ×1
HALTER HD/CHIN CERV TRACTION D (MISCELLANEOUS) ×2 IMPLANT
HEMOSTAT POWDER KIT SURGIFOAM (HEMOSTASIS) ×2 IMPLANT
KIT BASIN OR (CUSTOM PROCEDURE TRAY) ×2 IMPLANT
KIT ROOM TURNOVER OR (KITS) ×2 IMPLANT
NEEDLE HYPO 25X1 1.5 SAFETY (NEEDLE) ×2 IMPLANT
NEEDLE SPNL 22GX3.5 QUINCKE BK (NEEDLE) ×4 IMPLANT
NS IRRIG 1000ML POUR BTL (IV SOLUTION) ×2 IMPLANT
OIL CARTRIDGE MAESTRO DRILL (MISCELLANEOUS) ×2
PACK LAMINECTOMY NEURO (CUSTOM PROCEDURE TRAY) ×2 IMPLANT
PAD ARMBOARD 7.5X6 YLW CONV (MISCELLANEOUS) ×10 IMPLANT
PLATE 3 55XLCK NS SPNE CVD (Plate) ×1 IMPLANT
PLATE 3 ATLANTIS TRANS (Plate) ×1 IMPLANT
RUBBERBAND STERILE (MISCELLANEOUS) ×4 IMPLANT
SCREW ST 14X4XST FXANG SPNE (Screw) ×8 IMPLANT
SCREW ST FIX 4 ATL (Screw) ×8 IMPLANT
SPONGE INTESTINAL PEANUT (DISPOSABLE) ×2 IMPLANT
SPONGE SURGIFOAM ABS GEL 100 (HEMOSTASIS) ×2 IMPLANT
STAPLER SKIN PROX WIDE 3.9 (STAPLE) IMPLANT
SUT VIC AB 0 CT1 18XCR BRD8 (SUTURE) IMPLANT
SUT VIC AB 0 CT1 8-18 (SUTURE)
SUT VIC AB 2-0 CP2 18 (SUTURE) ×2 IMPLANT
SUT VIC AB 3-0 SH 8-18 (SUTURE) ×4 IMPLANT
SYR BULB 3OZ (MISCELLANEOUS) ×2 IMPLANT
TOWEL GREEN STERILE (TOWEL DISPOSABLE) ×2 IMPLANT
TOWEL GREEN STERILE FF (TOWEL DISPOSABLE) ×2 IMPLANT
WATER STERILE IRR 1000ML POUR (IV SOLUTION) ×2 IMPLANT

## 2017-03-23 NOTE — Op Note (Signed)
03/23/2017  11:15 AM  PATIENT:  Timothy Powell  41 y.o. male  PRE-OPERATIVE DIAGNOSIS:  C4-5, C5-6, and C6-7 cervical disc herniation; cervical spondylosis; cervical degenerative disease; cervical radiculopathy  POST-OPERATIVE DIAGNOSIS:  C4-5, C5-6, and C6-7 cervical disc herniation; cervical spondylosis; cervical degenerative disease; cervical radiculopathy  PROCEDURE:  Procedure(s):  C4-5, C5-6, and C6-7 anterior cervical decompression arthrodesis with structural allograft and Atlantis translational cervical plating  SURGEON:  [name], M.D.  ASSISTANTS: Newman Pies, M.D.  ANESTHESIA:   general  EBL:  Total I/O In: 1700 [I.V.:1700] Out: 440 [Urine:265; Blood:175]  BLOOD ADMINISTERED:none  COUNT: Correct per nursing staff  DICTATION: Patient was brought to the operating room placed under general endotracheal anesthesia. Patient was placed in 10 pounds of halter traction. The neck was prepped with Betadine soap and solution and draped in a sterile fashion. A obliquel incision was made on the left side of the neck paralleling the anterior border of the sternocleidomastoid.. The line of the incision was infiltrated with local anesthetic with epinephrine. Dissection was carried down thru the subcutaneous tissue and platysma, bipolar cautery was used to maintain hemostasis. Dissection was then carried out thru an avascular plane leaving the sternocleidomastoid carotid artery and jugular vein laterally and the trachea and esophagus medially. The ventral aspect of the vertebral column was identified and a localizing x-ray was taken. The C4-5, C5-6, and C6-7 levels were identified. The annulus at each level was incised and the disc space entered. The ventral osteophytic overgrowth was removed using the osteophyte removal tool, and the high-speed drill. Discectomy was performed with micro-curettes and pituitary rongeurs. The operating microscope was draped and brought into the field  provided additional magnification illumination and visualization. Discectomy was continued posteriorly thru the disc space and then the cartilaginous endplate was removed using micro-curettes along with the high-speed drill. Posterior osteophytic overgrowth was removed at each level using the high-speed drill along with a 2 mm thin footplated Kerrison punch. Posterior longitudinal ligament along with disc herniation was carefully removed, decompressing the spinal canal and thecal sac. We then continued to remove osteophytic overgrowth and disc material decompressing the neural foramina and exiting nerve roots bilaterally. Once the decompression was completed hemostasis was established at each level with the use of Gelfoam with thrombin and bipolar cautery. The Gelfoam was removed, a thin layer Surgifoam applied, the wound irrigated and hemostasis confirmed. We then measured the height of each intravertebral disc space level and selected a 6 millimeter in height structural allograft for the C4-5 level, a 6 millimeter in height structural allograft for the C5-6 level, and a 6 millimeter in height structural allograft for the C6-7 level . Each was hydrated in saline solution and then gently positioned in the intravertebral disc space and countersunk. We then selected a 55 millimeter in height Atlantis translational cervical plate. It was positioned over the fusion construct and secured to the vertebra with 4 x 14 mm fixed screws. Each screw hole was started with the high-speed drill and then the screws placed, once all the screws were placed, the locking system was secured. The wound was irrigated with bacitracin solution checked for hemostasis which was established and confirmed. An x-ray was taken which showed the grafts in good position, the plate and screws in good position, and the overall alignment looked good. We then proceeded with closure. The platysma was closed with interrupted inverted 2-0 undyed Vicryl  suture, the subcutaneous and subcuticular closed with interrupted inverted 3-0 undyed Vicryl suture. The skin edges  were approximated with Dermabond. Following surgery the patient was taken out of cervical traction. To be reversed and the anesthetic and taken to the recovery room for further care.  PLAN OF CARE: Admit for overnight observation  PATIENT DISPOSITION:  PACU - hemodynamically stable.   Delay start of Pharmacological VTE agent (>24hrs) due to surgical blood loss or risk of bleeding:  yes

## 2017-03-23 NOTE — Transfer of Care (Signed)
Immediate Anesthesia Transfer of Care Note  Patient: Timothy Powell  Procedure(s) Performed: Procedure(s): Anterior Cervical Decompresion/Discectomy Fusion - Cervical Four-Cervical five - Cervical five-Cervical six - Cervical six-Cervical seven (N/A)  Patient Location: PACU  Anesthesia Type:General  Level of Consciousness: awake, drowsy and patient cooperative  Airway & Oxygen Therapy: Patient Spontanous Breathing and Patient connected to nasal cannula oxygen  Post-op Assessment: Report given to RN, Post -op Vital signs reviewed and stable and Patient moving all extremities X 4  Post vital signs: Reviewed and stable  Last Vitals:  Vitals:   03/23/17 0631  BP: (!) 149/102  Pulse: 62  Resp: 20  Temp: 36.7 C  SpO2: 98%    Last Pain:  Vitals:   03/23/17 0631  TempSrc: Oral      Patients Stated Pain Goal: 1 (64/38/38 1840)  Complications: No apparent anesthesia complications

## 2017-03-23 NOTE — Anesthesia Preprocedure Evaluation (Addendum)
Anesthesia Evaluation  Patient identified by MRN, date of birth, ID band Patient awake    Reviewed: Allergy & Precautions, NPO status , Patient's Chart, lab work & pertinent test results  History of Anesthesia Complications Negative for: history of anesthetic complications  Airway Mallampati: II  TM Distance: >3 FB Neck ROM: Full    Dental  (+) Teeth Intact, Dental Advisory Given   Pulmonary neg pulmonary ROS,    breath sounds clear to auscultation       Cardiovascular negative cardio ROS   Rhythm:Regular Rate:Normal     Neuro/Psych    GI/Hepatic negative GI ROS, Neg liver ROS,   Endo/Other  negative endocrine ROS  Renal/GU negative Renal ROS     Musculoskeletal  (+) Arthritis ,   Abdominal   Peds  Hematology negative hematology ROS (+)   Anesthesia Other Findings   Reproductive/Obstetrics                           Anesthesia Physical Anesthesia Plan  ASA: I  Anesthesia Plan: General   Post-op Pain Management:    Induction: Intravenous  PONV Risk Score and Plan: 3 and Ondansetron, Dexamethasone, Midazolam and Propofol infusion  Airway Management Planned: Oral ETT  Additional Equipment:   Intra-op Plan:   Post-operative Plan: Extubation in OR  Informed Consent: I have reviewed the patients History and Physical, chart, labs and discussed the procedure including the risks, benefits and alternatives for the proposed anesthesia with the patient or authorized representative who has indicated his/her understanding and acceptance.   Dental advisory given  Plan Discussed with:   Anesthesia Plan Comments:         Anesthesia Quick Evaluation

## 2017-03-23 NOTE — H&P (Signed)
Subjective: Patient is a 41 y.o. right handed white male who is admitted for treatment of multilevel advanced spinal disc herniation, with osteophytic neural foraminal encroachment bilaterally at the C4-5 C5-6 and C6-7 levels. Patient's been having difficulties with pain, numbness, and tingling in the first, second, and third digits of the right hand. He is developed atrophy of the right triceps and pectoral muscles and weakness in the right upper extremity. He is admitted now for a 3 level CIV-5, C5-6, and C6-7 anterior cervical decompression arthrodesis with structural allograft and cervical plating.    Patient Active Problem List   Diagnosis Date Noted  . Nonallopathic lesion of lumbosacral region 11/18/2016  . Piriformis syndrome of right side 11/03/2016  . Degenerative cervical disc 10/16/2016  . Nonallopathic lesion of cervical region 10/16/2016  . Nonallopathic lesion of thoracic region 10/16/2016  . Nonallopathic lesion of sacral region 10/16/2016  . Leg length discrepancy 10/16/2016  . Lateral epicondylitis of right elbow 10/23/2015  . 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 Blood pressure elevated 08/25/2011  . Anxiety, generalized 04/19/2009  . Abnormal kidney function study 11/29/2007   Past Medical History:  Diagnosis Date  . Hypogonadism in male     Past Surgical History:  Procedure Laterality Date  . ankle muscle and skin graft Right 1997  . BACK SURGERY  1997   fusion l4/5,  . ELBOW SURGERY Right 2017   repair tear  . 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 removal of skin and muscle graft Right 1997   infection  . repair bicep tear Right 2015  . skin grafts Right    right ankle and arm    Prescriptions Prior to Admission  Medication Sig Dispense Refill Last Dose  . ALPRAZolam (XANAX) 0.5 MG tablet Take 0.25 mg by mouth at bedtime as needed for anxiety.    03/22/2017 at Unknown time  . ibuprofen (ADVIL,MOTRIN) 200 MG tablet Take 800 mg by mouth every 8 (eight)  hours as needed.    Past Month at Unknown time  . Misc Natural Products (GLUCOSAMINE CHOND COMPLEX/MSM) TABS Take 2 tablets by mouth daily.   Past Month at Unknown time  . Multiple Vitamin (MULTI-VITAMINS) TABS Take 2 tablets by mouth daily. [place] [facility]EN   Past Month at Unknown time  . Testosterone 10 MG/ACT (2%) GEL Apply 7 Pump topically See admin instructions. Apply 7 pumps to legs daily   Past Week at Unknown time  . traMADol (ULTRAM) 50 MG tablet Take 1 tablet (50 mg total) by mouth 3 (three) times daily as needed. 30 tablet 0 More than a month at Unknown time   No Known Allergies  Social History  Substance Use Topics  . Smoking status: Never Smoker  . Smokeless tobacco: Never Used  . Alcohol use Not on file    History reviewed. No pertinent family history.   Review of Systems Pertinent items noted in HPI and remainder of comprehensive ROS otherwise negative.  Objective: Vital signs in last 24 hours: Temp:  [98 F (36.7 C)] 98 F (36.7 C) (08/20 0631) Pulse Rate:  [62] 62 (08/20 0631) Resp:  [20] 20 (08/20 0631) BP: (149)/(102) 149/102 (08/20 0631) SpO2:  [98 %] 98 % (08/20 0631) Weight:  [91.2 kg (201 lb)] 91.2 kg (201 lb) (08/20 2355)  EXAM: Patient well-developed well-nourished white male in no acute distress. Lungs are clear to auscultation , the patient has symmetrical respiratory excursion. Heart has a regular rate and rhythm normal S1  and S2 no murmur.   Abdomen is soft nontender nondistended bowel sounds are present. Extremity examination shows no clubbing cyanosis or edema. Extremity examination shows significant atrophy in the left leg, ankle, and foot more so than in the right ankle, leg, and foot. Musculoskeletal examination shows noticed patient over the cervical spinous processes. Patient is a full range of motion the neck., But some discomfort with lateral flexion to the right. Neurologic examination shows 5/5 strength in the deltoid and biceps bilaterally as well  as in the left triceps. However the right triceps is 4/5. Intrinsic Singh and grip are 5 bilaterally. Sensation is intact to pinprick in the distal upper extremities. Reflex examination shows the left biceps and brachioradialis are minimal, and the right biceps and brachioradialis are absent. Left triceps is absent. Right triceps is minimal. Quadriceps are 1 bilaterally. Gastrocnemius are absent. Toes are downgoing bilaterally.  Data Review:CBC    Component Value Date/Time   WBC 7.0 03/17/2017 1427   RBC 5.38 03/17/2017 1427   HGB 16.4 03/17/2017 1427   HCT 47.5 03/17/2017 1427   PLT 233 03/17/2017 1427   MCV 88.3 03/17/2017 1427   MCH 30.5 03/17/2017 1427   MCHC 34.5 03/17/2017 1427   RDW 12.5 03/17/2017 1427                          BMET    Component Value Date/Time   NA 139 03/17/2017 1427   K 4.5 03/17/2017 1427   CL 105 03/17/2017 1427   CO2 27 03/17/2017 1427   GLUCOSE 88 03/17/2017 1427   BUN 16 03/17/2017 1427   CREATININE 1.47 (H) 03/17/2017 1427   CALCIUM 8.9 03/17/2017 1427   GFRNONAA 57 (L) 03/17/2017 1427   GFRAA >60 03/17/2017 1427     Assessment/Plan: Patient with right cervical radiculopathy with weakness and atrophy in the right triceps as well as atrophy of the right pectoral muscles, with advanced degeneration at the C4-5, C5-6, C6-7 levels with spinal disc herniation and bilateral spinal and neural foraminal encroachment. He is admitted now for a 3 level ACDF. I've discussed with the patient the nature of his condition, the nature the surgical procedure, the typical length of surgery, hospital stay, and overall recuperation. We discussed limitations postoperatively. I discussed risks of surgery including risks of infection, bleeding, possibly need for transfusion, the risk of nerve root dysfunction with pain, weakness, numbness, or paresthesias, the risk of spinal cord dysfunction with paralysis of all 4 limbs and quadriplegia, and the risk of dural tear and CSF  leakage and possible need for further surgery, the risk of esophageal dysfunction causing dysphagia and the risk of laryngeal dysfunction causing hoarseness of the voice, the risk of failure of the arthrodesis and the possible need for further surgery, and the risk of anesthetic complications including myocardial infarction, stroke, pneumonia, and death. We also discussed the need for postoperative immobilization in a cervical collar. Understanding all this the patient does wish to proceed with surgery and is admitted for such.    Hosie Spangle, MD 03/23/2017 7:24 AM

## 2017-03-23 NOTE — Anesthesia Postprocedure Evaluation (Signed)
Anesthesia Post Note  Patient: DONELLE BABA  Procedure(s) Performed: Procedure(s) (LRB): Anterior Cervical Decompresion/Discectomy Fusion - Cervical Four-Cervical five - Cervical five-Cervical six - Cervical six-Cervical seven (N/A)     Patient location during evaluation: PACU Anesthesia Type: General Level of consciousness: awake and alert Pain management: pain level controlled Vital Signs Assessment: post-procedure vital signs reviewed and stable Respiratory status: spontaneous breathing, nonlabored ventilation, respiratory function stable and patient connected to nasal cannula oxygen Cardiovascular status: blood pressure returned to baseline and stable Postop Assessment: no signs of nausea or vomiting Anesthetic complications: no    Last Vitals:  Vitals:   03/23/17 1239 03/23/17 1300  BP: (!) 139/97 (!) 158/106  Pulse: 88 87  Resp: 12 17  Temp: (!) 36.4 C 36.5 C  SpO2: 95% 95%    Last Pain:  Vitals:   03/23/17 1300  TempSrc: Oral  PainSc:                  Sandrika Schwinn,JAMES TERRILL

## 2017-03-23 NOTE — Anesthesia Procedure Notes (Signed)
Procedure Name: Intubation Date/Time: 03/23/2017 7:35 AM Performed by: Willeen Cass P Pre-anesthesia Checklist: Patient identified, Emergency Drugs available, Suction available and Patient being monitored Patient Re-evaluated:Patient Re-evaluated prior to induction Oxygen Delivery Method: Circle System Utilized Preoxygenation: Pre-oxygenation with 100% oxygen Induction Type: IV induction Ventilation: Mask ventilation without difficulty Laryngoscope Size: Mac and 4 Grade View: Grade I Tube type: Oral Tube size: 7.5 mm Number of attempts: 1 Airway Equipment and Method: Stylet Placement Confirmation: ETT inserted through vocal cords under direct vision,  positive ETCO2 and breath sounds checked- equal and bilateral Tube secured with: Tape Dental Injury: Teeth and Oropharynx as per pre-operative assessment

## 2017-03-23 NOTE — Progress Notes (Addendum)
Vitals:   03/23/17 1230 03/23/17 1239 03/23/17 1300 03/23/17 1600  BP:  (!) 139/97 (!) 158/106 (!) 159/97  Pulse: 86 88 87 94  Resp: 14 12 17 16   Temp:  (!) 97.5 F (36.4 C) 97.7 F (36.5 C) 97.7 F (36.5 C)  TempSrc:   Oral Oral  SpO2: 93% 95% 95% 96%  Weight:      Height:        Patient sitting in bed, comfortable. Has ambulated in the halls twice so far. Has voided. Incision clean and dry. No erythema, swelling, or drainage. Moving all 4 extremities well.  Plan: Encouraged to continue to ambulate in the halls this evening. Continue to progress through postoperative recovery.  Hosie Spangle, MD 03/23/2017, 7:09 PM

## 2017-03-24 ENCOUNTER — Encounter (HOSPITAL_COMMUNITY): Payer: Self-pay | Admitting: Neurosurgery

## 2017-03-24 DIAGNOSIS — M50223 Other cervical disc displacement at C6-C7 level: Secondary | ICD-10-CM | POA: Diagnosis not present

## 2017-03-24 DIAGNOSIS — Z79899 Other long term (current) drug therapy: Secondary | ICD-10-CM | POA: Diagnosis not present

## 2017-03-24 DIAGNOSIS — M5010 Cervical disc disorder with radiculopathy, unspecified cervical region: Secondary | ICD-10-CM | POA: Diagnosis not present

## 2017-03-24 DIAGNOSIS — M1990 Unspecified osteoarthritis, unspecified site: Secondary | ICD-10-CM | POA: Diagnosis not present

## 2017-03-24 DIAGNOSIS — F419 Anxiety disorder, unspecified: Secondary | ICD-10-CM | POA: Diagnosis not present

## 2017-03-24 DIAGNOSIS — M4722 Other spondylosis with radiculopathy, cervical region: Secondary | ICD-10-CM | POA: Diagnosis not present

## 2017-03-24 MED ORDER — HYDROCODONE-ACETAMINOPHEN 5-325 MG PO TABS: 1.0000 | ORAL_TABLET | ORAL | 0 refills | Status: DC | PRN

## 2017-03-24 NOTE — Discharge Instructions (Signed)

## 2017-03-24 NOTE — Care Management Obs Status (Signed)
[facility] NOTIFICATION   Patient Details  Name: Timothy Powell MRN: [id number] Date of Birth: [birth year]   Medicare Observation Status Notification Given:  Yes    Ninfa Meeker, RN 03/24/2017, 10:19 AM

## 2017-03-24 NOTE — Progress Notes (Signed)
Patient alert and oriented, mae's well, voiding adequate amount of urine, swallowing without difficulty, no c/o pain at time of discharge. Patient discharged home with family. Script and discharged instructions given to patient. Patient and family stated understanding of instructions given. Patient has an appointment with Dr. Nudelman 

## 2017-03-24 NOTE — Discharge Summary (Signed)
Physician Discharge Summary  Patient ID: Timothy Powell MRN: [id number] DOB/AGE: 03/31/1976 41 y.o.  Admit date: 03/23/2017 Discharge date: 03/24/2017  Admission Diagnoses:  C4-5, C5-6, and C6-7 cervical disc herniation; cervical spondylosis; cervical degenerative disease; cervical radiculopathy  Discharge Diagnoses:  C4-5, C5-6, and C6-7 cervical disc herniation; cervical spondylosis; cervical degenerative disease; cervical radiculopathy Active Problems:   HNP (herniated nucleus pulposus), cervical   Discharged Condition: good  Hospital Course: Patient was admitted, underwent a 3 level ACDF. He has done well following surgery. He is up and ambulating actively. His incision is healing nicely. He is scheduled to follow-up with me in 3 weeks. He has been given instructions regarding wound care and activities following discharge.  Discharge Exam: Blood pressure (!) 153/100, pulse 72, temperature 98 F (36.7 C), temperature source Oral, resp. rate 16, height 5\' 10"  (1.778 m), weight 91.2 kg (201 lb), SpO2 98 %.  Disposition:  Home  Discharge Instructions    Discharge wound care:    Complete by:  As directed    Leave the wound open to air. Shower daily with the wound uncovered. Water and soapy water should run over the incision area. Do not wash directly on the incision for 2 weeks. Remove the glue after 2 weeks.   Driving Restrictions    Complete by:  As directed    No driving for 2 weeks. May ride in the car locally now. May begin to drive locally in 2 weeks.   Other Restrictions    Complete by:  As directed    Walk gradually increasing distances out in the fresh air at least twice a day. Walking additional 6 times inside the house, gradually increasing distances, daily. No bending, lifting, or twisting. Perform activities between shoulder and waist height (that is at counter height when standing or table height when sitting).     Allergies as of 03/24/2017   No Known Allergies      Medication List    TAKE these medications   ALPRAZolam 0.5 MG tablet Commonly known as:  XANAX Take 0.25 mg by mouth at bedtime as needed for anxiety.   GLUCOSAMINE CHOND COMPLEX/MSM Tabs Take 2 tablets by mouth daily.   HYDROcodone-acetaminophen 5-325 MG tablet Commonly known as:  NORCO/VICODIN Take 1-2 tablets by mouth every 4 (four) hours as needed (pain).   ibuprofen 200 MG tablet Commonly known as:  ADVIL,MOTRIN Take 800 mg by mouth every 8 (eight) hours as needed.   MULTI-VITAMINS Tabs Take 2 tablets by mouth daily. [employer] [facility] MEN   Testosterone 10 MG/ACT (2%) Gel Apply 7 Pump topically See admin instructions. Apply 7 pumps to legs daily   traMADol 50 MG tablet Commonly known as:  ULTRAM Take 1 tablet (50 mg total) by mouth 3 (three) times daily as needed.        Signed: Hosie Spangle, MD 03/24/2017, 8:12 AM

## 2017-04-14 DIAGNOSIS — Z981 Arthrodesis status: Secondary | ICD-10-CM | POA: Insufficient documentation

## 2017-04-14 DIAGNOSIS — M5030 Other cervical disc degeneration, unspecified cervical region: Secondary | ICD-10-CM | POA: Diagnosis not present

## 2017-04-14 DIAGNOSIS — M5020 Other cervical disc displacement, unspecified cervical region: Secondary | ICD-10-CM | POA: Diagnosis not present

## 2017-04-14 DIAGNOSIS — Z6828 Body mass index (BMI) 28.0-28.9, adult: Secondary | ICD-10-CM | POA: Diagnosis not present

## 2017-04-14 DIAGNOSIS — M4722 Other spondylosis with radiculopathy, cervical region: Secondary | ICD-10-CM | POA: Diagnosis not present

## 2017-04-14 DIAGNOSIS — M5412 Radiculopathy, cervical region: Secondary | ICD-10-CM | POA: Diagnosis not present

## 2017-04-14 DIAGNOSIS — R030 Elevated blood-pressure reading, without diagnosis of hypertension: Secondary | ICD-10-CM | POA: Diagnosis not present

## 2017-04-14 DIAGNOSIS — R29898 Other symptoms and signs involving the musculoskeletal system: Secondary | ICD-10-CM | POA: Diagnosis not present

## 2017-06-16 DIAGNOSIS — R29898 Other symptoms and signs involving the musculoskeletal system: Secondary | ICD-10-CM | POA: Diagnosis not present

## 2017-06-16 DIAGNOSIS — R030 Elevated blood-pressure reading, without diagnosis of hypertension: Secondary | ICD-10-CM | POA: Diagnosis not present

## 2017-06-16 DIAGNOSIS — Z981 Arthrodesis status: Secondary | ICD-10-CM | POA: Diagnosis not present

## 2017-06-16 DIAGNOSIS — Z6828 Body mass index (BMI) 28.0-28.9, adult: Secondary | ICD-10-CM | POA: Diagnosis not present

## 2017-08-31 DIAGNOSIS — E230 Hypopituitarism: Secondary | ICD-10-CM | POA: Diagnosis not present

## 2017-08-31 DIAGNOSIS — E039 Hypothyroidism, unspecified: Secondary | ICD-10-CM | POA: Diagnosis not present

## 2017-09-04 DIAGNOSIS — E063 Autoimmune thyroiditis: Secondary | ICD-10-CM | POA: Diagnosis not present

## 2017-09-04 DIAGNOSIS — E039 Hypothyroidism, unspecified: Secondary | ICD-10-CM | POA: Diagnosis not present

## 2017-09-04 DIAGNOSIS — E230 Hypopituitarism: Secondary | ICD-10-CM | POA: Diagnosis not present

## 2017-10-13 DIAGNOSIS — Z6829 Body mass index (BMI) 29.0-29.9, adult: Secondary | ICD-10-CM | POA: Diagnosis not present

## 2017-10-13 DIAGNOSIS — Z981 Arthrodesis status: Secondary | ICD-10-CM | POA: Diagnosis not present

## 2017-10-14 DIAGNOSIS — M21372 Foot drop, left foot: Secondary | ICD-10-CM | POA: Diagnosis not present

## 2017-10-14 DIAGNOSIS — M4807 Spinal stenosis, lumbosacral region: Secondary | ICD-10-CM | POA: Diagnosis not present

## 2017-10-14 DIAGNOSIS — M5126 Other intervertebral disc displacement, lumbar region: Secondary | ICD-10-CM | POA: Diagnosis not present

## 2017-10-21 DIAGNOSIS — Z6829 Body mass index (BMI) 29.0-29.9, adult: Secondary | ICD-10-CM | POA: Insufficient documentation

## 2017-10-21 DIAGNOSIS — M21372 Foot drop, left foot: Secondary | ICD-10-CM | POA: Diagnosis not present

## 2017-10-21 DIAGNOSIS — R030 Elevated blood-pressure reading, without diagnosis of hypertension: Secondary | ICD-10-CM | POA: Insufficient documentation

## 2017-10-21 DIAGNOSIS — M6258 Muscle wasting and atrophy, not elsewhere classified, other site: Secondary | ICD-10-CM | POA: Insufficient documentation

## 2017-10-21 DIAGNOSIS — Z6828 Body mass index (BMI) 28.0-28.9, adult: Secondary | ICD-10-CM | POA: Insufficient documentation

## 2017-10-21 DIAGNOSIS — M546 Pain in thoracic spine: Secondary | ICD-10-CM | POA: Diagnosis not present

## 2017-10-21 DIAGNOSIS — G8220 Paraplegia, unspecified: Secondary | ICD-10-CM | POA: Diagnosis not present

## 2017-12-02 ENCOUNTER — Encounter: Payer: Medicare Other | Attending: Physical Medicine & Rehabilitation | Admitting: Physical Medicine & Rehabilitation

## 2017-12-02 ENCOUNTER — Encounter: Payer: Self-pay | Admitting: Physical Medicine & Rehabilitation

## 2017-12-02 DIAGNOSIS — E291 Testicular hypofunction: Secondary | ICD-10-CM | POA: Insufficient documentation

## 2017-12-02 DIAGNOSIS — Z833 Family history of diabetes mellitus: Secondary | ICD-10-CM | POA: Insufficient documentation

## 2017-12-02 DIAGNOSIS — R200 Anesthesia of skin: Secondary | ICD-10-CM | POA: Diagnosis not present

## 2017-12-02 DIAGNOSIS — Z8249 Family history of ischemic heart disease and other diseases of the circulatory system: Secondary | ICD-10-CM | POA: Insufficient documentation

## 2017-12-02 DIAGNOSIS — Z800 Family history of malignant neoplasm of digestive organs: Secondary | ICD-10-CM | POA: Diagnosis not present

## 2017-12-02 DIAGNOSIS — M792 Neuralgia and neuritis, unspecified: Secondary | ICD-10-CM

## 2017-12-02 DIAGNOSIS — Z8781 Personal history of (healed) traumatic fracture: Secondary | ICD-10-CM | POA: Diagnosis not present

## 2017-12-02 DIAGNOSIS — M533 Sacrococcygeal disorders, not elsewhere classified: Secondary | ICD-10-CM | POA: Insufficient documentation

## 2017-12-02 DIAGNOSIS — M79604 Pain in right leg: Secondary | ICD-10-CM | POA: Insufficient documentation

## 2017-12-02 DIAGNOSIS — Z9889 Other specified postprocedural states: Secondary | ICD-10-CM | POA: Diagnosis not present

## 2017-12-02 DIAGNOSIS — G834 Cauda equina syndrome: Secondary | ICD-10-CM | POA: Insufficient documentation

## 2017-12-02 DIAGNOSIS — M79605 Pain in left leg: Secondary | ICD-10-CM | POA: Insufficient documentation

## 2017-12-02 DIAGNOSIS — M4322 Fusion of spine, cervical region: Secondary | ICD-10-CM | POA: Diagnosis not present

## 2017-12-02 NOTE — Patient Instructions (Signed)
PLEASE FEEL FREE TO CALL OUR OFFICE WITH ANY PROBLEMS OR QUESTIONS ([phone])      
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
## 2018-01-05 ENCOUNTER — Encounter: Payer: Medicare Other | Admitting: Physical Medicine & Rehabilitation

## 2018-01-15 ENCOUNTER — Ambulatory Visit
Admission: RE | Admit: 2018-01-15 | Discharge: 2018-01-15 | Disposition: A | Discharge status: Home / Self Care | Payer: Medicare Other | Source: Ambulatory Visit | Attending: Physical Medicine & Rehabilitation | Admitting: Physical Medicine & Rehabilitation

## 2018-01-15 ENCOUNTER — Telehealth: Payer: Self-pay | Admitting: Physical Medicine & Rehabilitation

## 2018-01-15 DIAGNOSIS — G834 Cauda equina syndrome: Secondary | ICD-10-CM | POA: Diagnosis not present

## 2018-01-15 DIAGNOSIS — M6788 Other specified disorders of synovium and tendon, other site: Secondary | ICD-10-CM | POA: Diagnosis not present

## 2018-01-15 DIAGNOSIS — R937 Abnormal findings on diagnostic imaging of other parts of musculoskeletal system: Secondary | ICD-10-CM | POA: Diagnosis not present

## 2018-01-15 DIAGNOSIS — M533 Sacrococcygeal disorders, not elsewhere classified: Secondary | ICD-10-CM

## 2018-01-19 ENCOUNTER — Encounter: Payer: Medicare Other | Admitting: Physical Medicine & Rehabilitation

## 2018-01-19 IMAGING — MR MR CERVICAL SPINE W/O CM
5 series · 28 of 48 positions shown · non-contrast
Comparison: None.

CLINICAL DATA: Cervical radiculopathy. Radicular symptoms to the
right index finger.

EXAM:
MRI CERVICAL SPINE WITHOUT CONTRAST
TECHNIQUE: Multiplanar, multisequence MR imaging of the cervical spine was
performed. No intravenous contrast was administered.

[Series 6: T1 · sagittal · 3.0mm · 0.66mm/px · 6 of 15 slices shown]
[im 1/15]
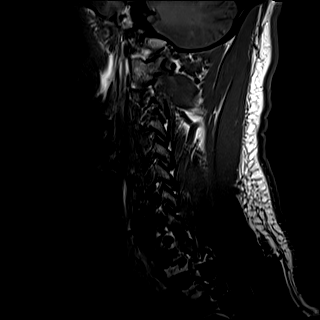
[im 3/15]
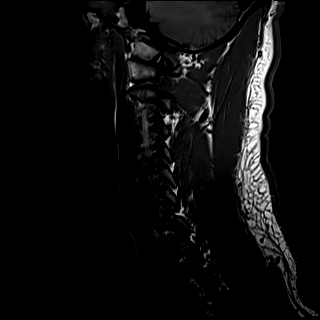
[im 6/15]
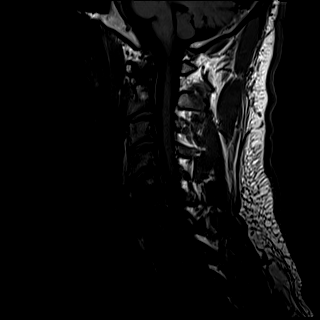
[im 9/15]
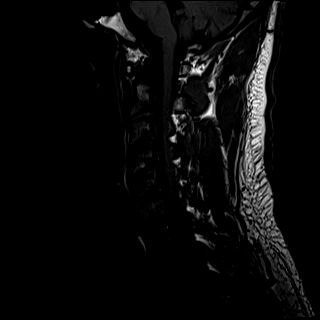
[im 12/15]
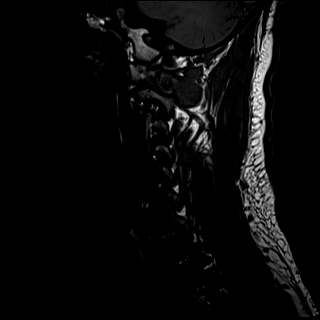
[im 15/15]
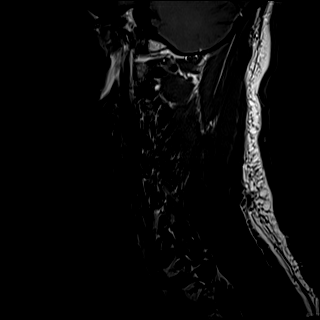

[Series 7: T2 · sagittal · 3.0mm · 0.55mm/px · 6 of 15 slices shown (1 of 2)]
[im 1/15]
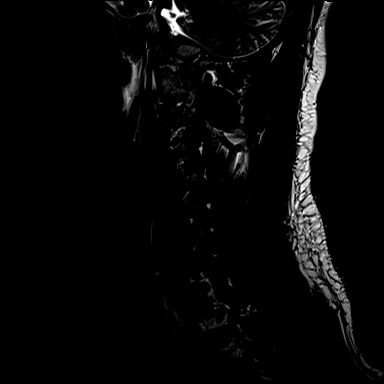
[im 3/15]
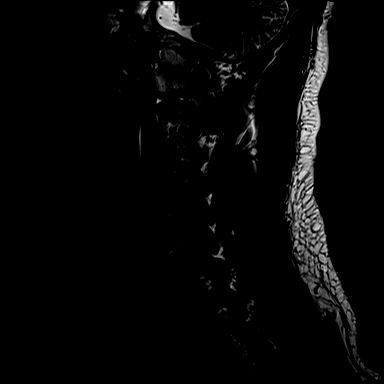
[im 6/15]
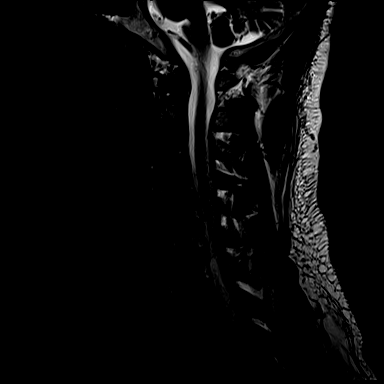
[im 9/15]
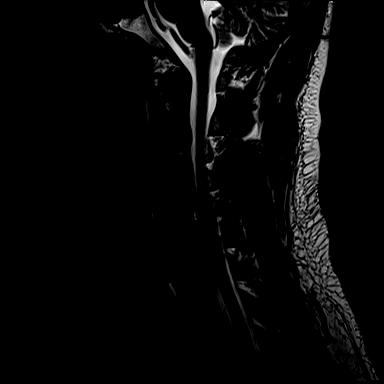
[im 12/15]
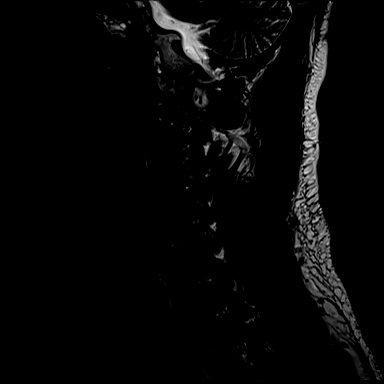
[im 15/15]
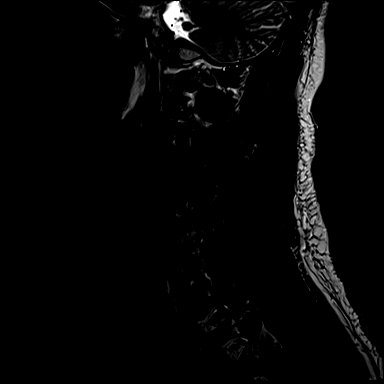

[Series 8: STIR · sagittal · 3.0mm · 0.33mm/px · 6 of 15 slices shown]
[im 1/15]
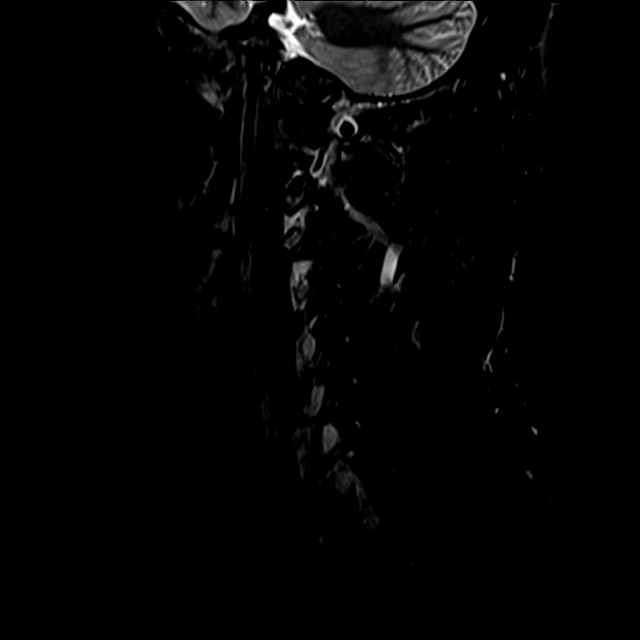
[im 3/15]
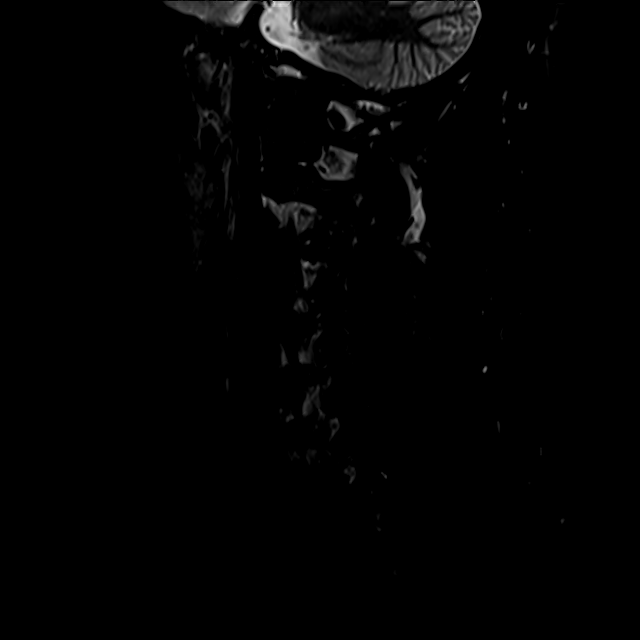
[im 6/15]
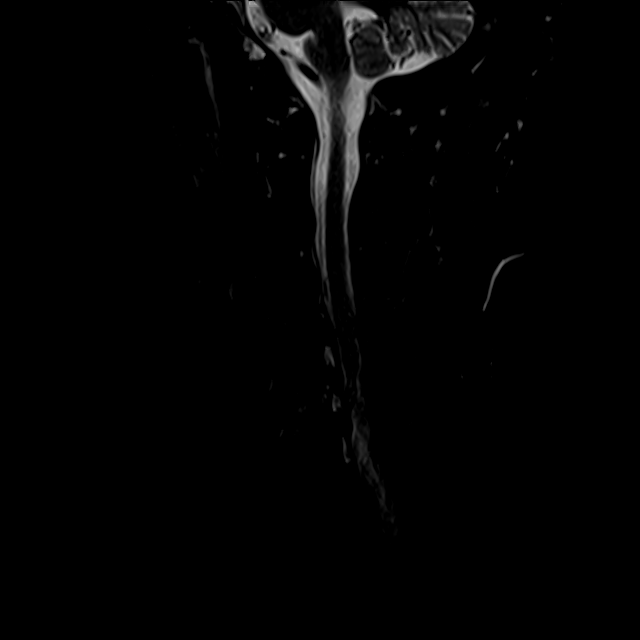
[im 9/15]
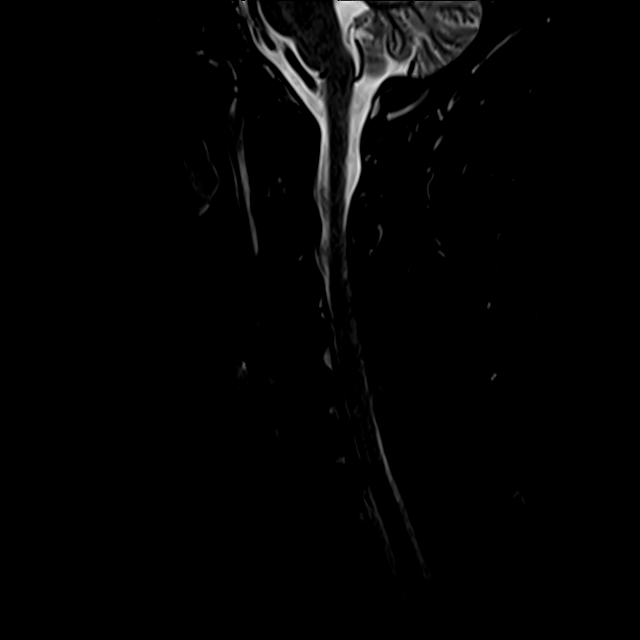
[im 12/15]
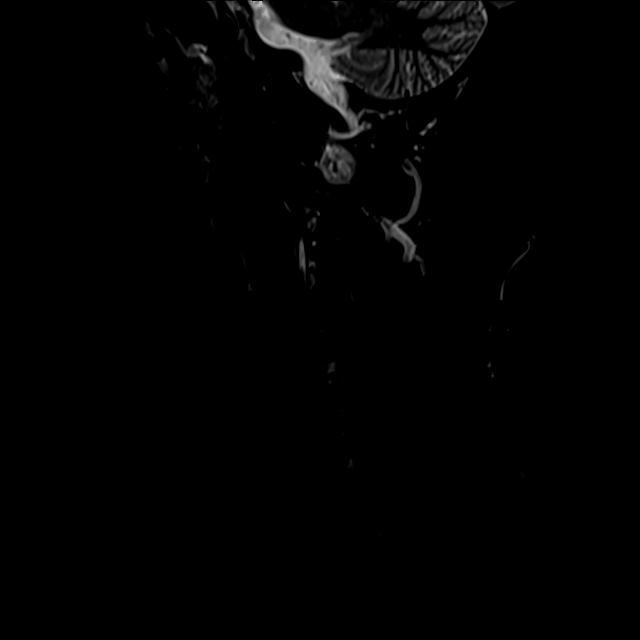
[im 15/15]
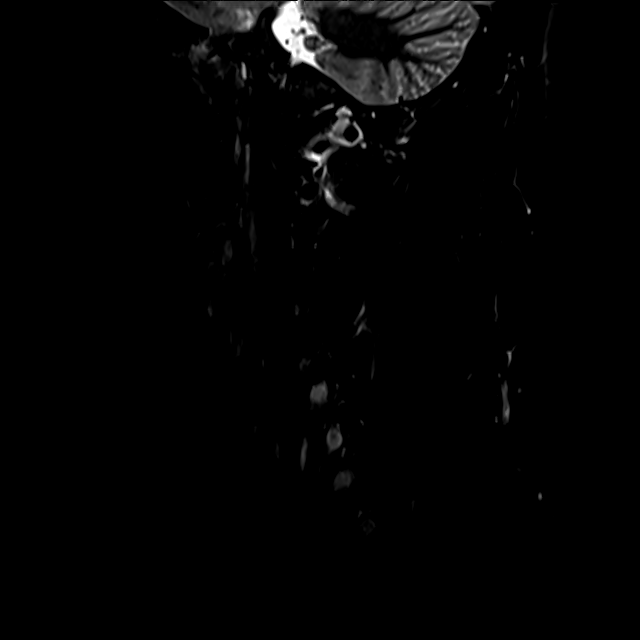

[Series 9: T2 · axial · 3.0mm · 0.50mm/px · z∈[-103,+7]mm · 9 of 35 slices shown (2 of 2)]
[im 1/35]
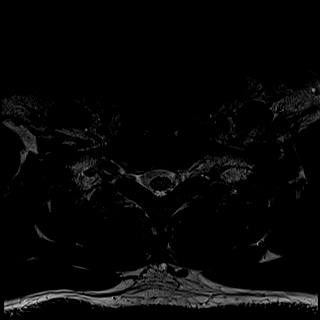
[im 5/35]
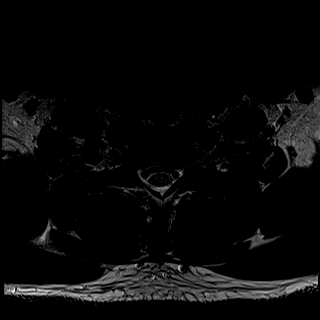
[im 10/35]
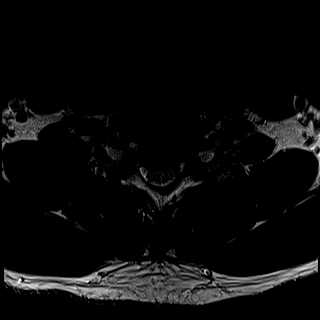
[im 15/35]
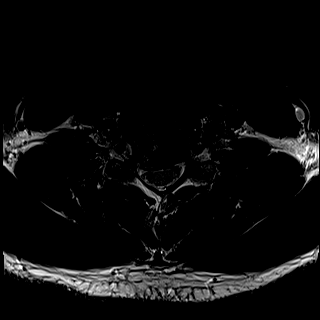
[im 18/35]
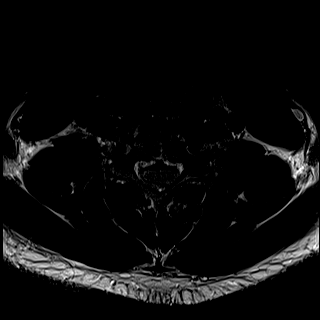
[im 20/35]
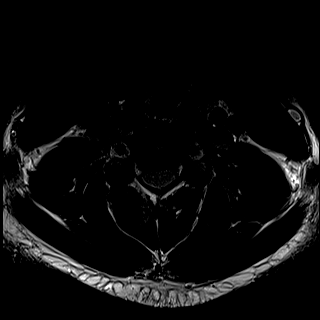
[im 25/35]
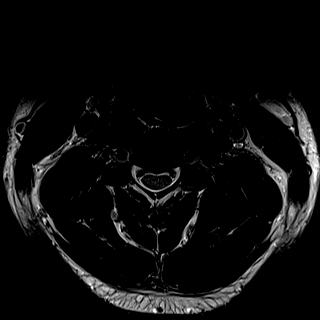
[im 30/35]
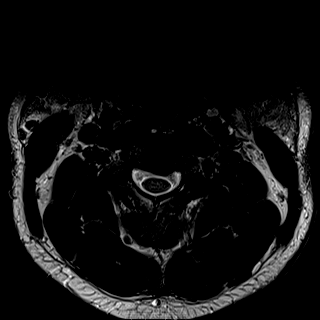
[im 35/35]
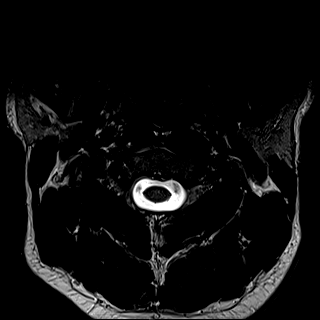

[Series 10: GRE · axial · 3.0mm · 0.42mm/px · 1 of 35 slices shown]
[im 1/35]
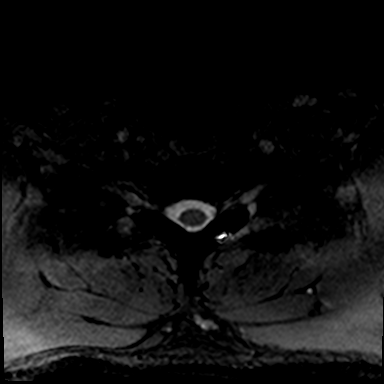

[28 of 48 positions shown; findings below may reference images not displayed]

FINDINGS: Alignment: Physiologic.

Vertebrae: No fracture, evidence of discitis, or bone lesion.

Cord: Normal signal and morphology.

Posterior Fossa, vertebral arteries, paraspinal tissues: Negative.

Disc levels:

C2-3: Mild left uncovertebral ridging.  No impingement

C3-4: Central disc protrusion without cord contact.  Patent foramina

C4-5: Degenerative disc narrowing with posterior disc osteophyte
complex and uncovertebral ridging. Bilateral foraminal impingement.
Ventral subarachnoid space is effaced and there is mild indentation
on the ventral cord.

C5-6: Degenerative disc narrowing with asymmetric right endplate and
uncovertebral ridging. Mild indentation on the right ventral cord.
Right foraminal impingement. Good patency of the left foramen.

C6-7: Degenerative disc narrowing with right more than left
uncovertebral ridging. Moderate bilateral foraminal stenosis.
Partial effacement of CSF around the cord, mild spinal stenosis.

C7-T1:Unremarkable.
IMPRESSION: 1. Uncovertebral spurring causes foraminal impingement bilaterally
at C4-5 and C6-7 and on the right at C5-6.
2. Mild spinal stenosis from C4-5 to C6-7.
3. C3-4 central disc protrusion without cord contact.

## 2018-01-20 ENCOUNTER — Encounter: Payer: Medicare Other | Attending: Physical Medicine & Rehabilitation | Admitting: Physical Medicine & Rehabilitation

## 2018-01-20 ENCOUNTER — Encounter: Payer: Self-pay | Admitting: Physical Medicine & Rehabilitation

## 2018-01-20 VITALS — BP 137/95 | HR 76 | Resp 14 | Ht 70.0 in | Wt 207.0 lb

## 2018-01-20 DIAGNOSIS — M79605 Pain in left leg: Secondary | ICD-10-CM | POA: Diagnosis not present

## 2018-01-20 DIAGNOSIS — Z8249 Family history of ischemic heart disease and other diseases of the circulatory system: Secondary | ICD-10-CM | POA: Insufficient documentation

## 2018-01-20 DIAGNOSIS — M4322 Fusion of spine, cervical region: Secondary | ICD-10-CM | POA: Diagnosis not present

## 2018-01-20 DIAGNOSIS — M79604 Pain in right leg: Secondary | ICD-10-CM | POA: Insufficient documentation

## 2018-01-20 DIAGNOSIS — M792 Neuralgia and neuritis, unspecified: Secondary | ICD-10-CM | POA: Diagnosis not present

## 2018-01-20 DIAGNOSIS — Z833 Family history of diabetes mellitus: Secondary | ICD-10-CM | POA: Insufficient documentation

## 2018-01-20 DIAGNOSIS — M533 Sacrococcygeal disorders, not elsewhere classified: Secondary | ICD-10-CM | POA: Diagnosis not present

## 2018-01-20 DIAGNOSIS — Z8781 Personal history of (healed) traumatic fracture: Secondary | ICD-10-CM | POA: Insufficient documentation

## 2018-01-20 DIAGNOSIS — Z800 Family history of malignant neoplasm of digestive organs: Secondary | ICD-10-CM | POA: Insufficient documentation

## 2018-01-20 DIAGNOSIS — E291 Testicular hypofunction: Secondary | ICD-10-CM | POA: Diagnosis not present

## 2018-01-20 DIAGNOSIS — Z9889 Other specified postprocedural states: Secondary | ICD-10-CM | POA: Insufficient documentation

## 2018-01-20 DIAGNOSIS — R200 Anesthesia of skin: Secondary | ICD-10-CM | POA: Diagnosis not present

## 2018-01-20 DIAGNOSIS — G834 Cauda equina syndrome: Secondary | ICD-10-CM | POA: Diagnosis not present

## 2018-01-20 MED ORDER — DICLOFENAC SODIUM 50 MG PO TBEC: 50.0000 mg | DELAYED_RELEASE_TABLET | Freq: Two times a day (BID) | ORAL | 3 refills | Status: DC | PRN

## 2018-01-20 NOTE — Progress Notes (Signed)
Subjective:    Patient ID: Timothy Powell, male    DOB: Sep 29, 1975, 42 y.o.   MRN: [id number]  HPI   Patient is here for follow-up of his cauda equina syndrome.  Saw him last a month ago.  We ordered an MRI of his pelvis which revealed the following:   The pubic symphysis and SI joints are intact. No findings for sacroiliitis. No pelvic stress fracture or bone lesion.  There is thickening and increased T2 signal intensity in and around the region of the gluteus medius tendon near its attachment site. There is also some fluid between the distal gluteus medius and minimus muscles. Findings suggest a partial tear or tendinopathy. Recommend correlation with right-sided hip pain. No associated trochanteric bursitis.  IMPRESSION: 1. Gluteus medius tendinopathy and possible partial thickness tear with some surrounding fluid and edema. Recommend correlation with right-sided hip pain. 2. No pelvic or hip stress fracture or bone lesion. No evidence of AVN. 3. No significant intrapelvic abnormalities.  His pain remains about the same located in his sacral area.  He also has tightness in his right low back at times if he walks longer distances.  He will use an occasional ibuprofen for pain.  He continues to work out aggressively when he can with a lot of weightlifting.  He also remains somewhat active aerobically as well.   Pain Inventory Average Pain 3 Pain Right Now 3 My pain is aching  In the last 24 hours, has pain interfered with the following? General activity 1 Relation with others 1 Enjoyment of life 1 What TIME of day is your pain at its worst? daytime, evening Sleep (in general) Fair  Pain is worse with: walking, sitting, inactivity and standing Pain improves with: rest, medication and injections Relief from Meds: 5  Mobility walk with assistance ability to climb steps?  yes do you drive?  yes  Function disabled: date disabled  .  Neuro/Psych weakness numbness  Prior Studies Any changes since last visit?  no  Physicians involved in your care Any changes since last visit?  no   Family History  Problem Relation Age of Onset  . Heart disease Maternal Grandfather   . Diabetes Paternal Grandmother   . Pancreatic cancer Paternal Grandmother    Social History   Socioeconomic History  . Marital status: Single    Spouse name: Not on file  . Number of children: Not on file  . Years of education: Not on file  . Highest education level: Not on file  Occupational History  . Not on file  Social Needs  . Financial resource strain: Not on file  . Food insecurity:    Worry: Not on file    Inability: Not on file  . Transportation needs:    Medical: Not on file    Non-medical: Not on file  Tobacco Use  . Smoking status: Never Smoker  . Smokeless tobacco: Never Used  Substance and Sexual Activity  . Alcohol use: Not Currently    Alcohol/week: 0.0 oz    Comment: social  . Drug use: No  . Sexual activity: Not on file  Lifestyle  . Physical activity:    Days per week: Not on file    Minutes per session: Not on file  . Stress: Not on file  Relationships  . Social connections:    Talks on phone: Not on file    Gets together: Not on file    Attends religious service: Not on file  Active member of club or organization: Not on file    Attends meetings of clubs or organizations: Not on file    Relationship status: Not on file  Other Topics Concern  . Not on file  Social History Narrative  . Not on file   Past Surgical History:  Procedure Laterality Date  . ankle muscle and skin graft Right 1997  . ANTERIOR CERVICAL DECOMP/DISCECTOMY FUSION N/A 03/23/2017   Procedure: Anterior Cervical Decompresion/Discectomy Fusion - Cervical Four-Cervical five - Cervical five-Cervical six - Cervical six-Cervical seven;  Surgeon: [name], MD;  Location: [facility];  Service: Neurosurgery;  Laterality: N/A;  .  BACK SURGERY  1997   fusion l4/5,  . ELBOW SURGERY Right 2017   repair tear  . 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 removal of skin and muscle graft Right 1997   infection  . repair bicep tear Right 2015  . SHOULDER SURGERY  2011  . skin grafts Right    right ankle and arm   Past Medical History:  Diagnosis Date  . Cauda equina syndrome ([place])   . Hypogonadism in male    There were no vitals taken for this visit.  Opioid Risk Score:   Fall Risk Score:  `1  Depression screen PHQ 2/9  No flowsheet data found.  Review of Systems  Constitutional: Negative.   HENT: Negative.   Eyes: Negative.   Respiratory: Negative.   Cardiovascular: Negative.   Gastrointestinal: Negative.   Endocrine: Negative.   Musculoskeletal: Positive for back pain.  Skin: Negative.   Allergic/Immunologic: Negative.   Neurological: Positive for weakness and numbness.  Hematological: Negative.   Psychiatric/Behavioral: Negative.   All other systems reviewed and are negative.      Objective:   Physical Exam  General: No acute distress HEENT: EOMI, oral membranes moist Cards: reg rate  Chest: normal effort Abdomen: Soft, NT, ND Skin: dry, intact Extremities: no edema Musculoskeletal: lean and intact without signs of breakdown. Scarring along right leg from remote surgery Neuro: Pt is cognitively appropriate with normal insight, memory, and awareness. Cranial nerves 2-12 are intact. Sensory exam is normal. Reflexes are 2+ in UE's.. Fine motor coordination is intact. No tremors. Motor function is grossly 5/5 in UE. RLE HF 5/5, HE 4/5, KE 5/5, KF 4/5, ADF 3-, APF 2. LLE: HF 5/5, HE 4/5, KE 5/5, KF 4/5, ADF 2-/5, APF 1/5. Mild steppage gait, hides better with shoes  Musculoskeletal: sacral pain. Slightly worse with palpation Psych: Pt's affect is appropriate. Pt is cooperative. Pleasant.        Assessment & Plan:  1. Cauda equina syndrome with residual foot drop and sensory loss in the LE's.  Residual dysesthesias in the lower extremities.  2. Complex sacral fractures with persistent sacral and coccygeal pain which has affected his quality of life.  MRI reveals lateral gluteal tendinosis but no sacral pathology to explain his coccygeal pain.  I suspect some of this may be neuropathic given his cauda equina. 3. Testosterone deficiency unclear etiology    Plan:  1. Begin a trial of diclofenac 50mg  BID as needed. Stop ibuprofen he may use diclofenac for days when he will be more active..  2.  I offered a referral to neuro-rehab to focus on mechanics and walking techniques, and establishment/refocusing of a regular stretching and home exercise program.  He would like to hold off for now because of his busy schedule. 3. Consider anticonvulsant for nerve pain? 4. For now he  will work on Higher education careers adviser and realistic expectations with his work out regimen. He will call me if he has any problems or questions   Thirty minutes of face to face patient care time were spent during this visit. All questions were encouraged and answered. Follow up in 2 months.

## 2018-01-20 NOTE — Patient Instructions (Addendum)
PLEASE FEEL FREE TO CALL OUR OFFICE WITH ANY PROBLEMS OR QUESTIONS ([phone])     YOU NEED TO BE THINKING ABOUT POSTURE, MECHANICS, TECHNIQUE WITH ALL OF YOUR ACTIVITIES AT HOME AND DURING WORKOUTS/EXERCISE

## 2018-03-22 ENCOUNTER — Encounter: Payer: Medicare Other | Attending: Physical Medicine & Rehabilitation | Admitting: Physical Medicine & Rehabilitation

## 2018-03-22 DIAGNOSIS — M533 Sacrococcygeal disorders, not elsewhere classified: Secondary | ICD-10-CM | POA: Insufficient documentation

## 2018-03-22 DIAGNOSIS — Z800 Family history of malignant neoplasm of digestive organs: Secondary | ICD-10-CM | POA: Insufficient documentation

## 2018-03-22 DIAGNOSIS — Z8781 Personal history of (healed) traumatic fracture: Secondary | ICD-10-CM | POA: Insufficient documentation

## 2018-03-22 DIAGNOSIS — G834 Cauda equina syndrome: Secondary | ICD-10-CM | POA: Insufficient documentation

## 2018-03-22 DIAGNOSIS — Z9889 Other specified postprocedural states: Secondary | ICD-10-CM | POA: Insufficient documentation

## 2018-03-22 DIAGNOSIS — Z833 Family history of diabetes mellitus: Secondary | ICD-10-CM | POA: Insufficient documentation

## 2018-03-22 DIAGNOSIS — E291 Testicular hypofunction: Secondary | ICD-10-CM | POA: Insufficient documentation

## 2018-03-22 DIAGNOSIS — Z8249 Family history of ischemic heart disease and other diseases of the circulatory system: Secondary | ICD-10-CM | POA: Insufficient documentation

## 2018-03-22 DIAGNOSIS — M4322 Fusion of spine, cervical region: Secondary | ICD-10-CM | POA: Insufficient documentation

## 2018-03-22 DIAGNOSIS — M79605 Pain in left leg: Secondary | ICD-10-CM | POA: Insufficient documentation

## 2018-03-22 DIAGNOSIS — R200 Anesthesia of skin: Secondary | ICD-10-CM | POA: Insufficient documentation

## 2018-03-22 DIAGNOSIS — M79604 Pain in right leg: Secondary | ICD-10-CM | POA: Insufficient documentation

## 2018-05-21 DIAGNOSIS — E230 Hypopituitarism: Secondary | ICD-10-CM | POA: Diagnosis not present

## 2018-05-21 DIAGNOSIS — E039 Hypothyroidism, unspecified: Secondary | ICD-10-CM | POA: Diagnosis not present

## 2018-05-21 DIAGNOSIS — E063 Autoimmune thyroiditis: Secondary | ICD-10-CM | POA: Diagnosis not present

## 2018-06-14 NOTE — Progress Notes (Signed)
Timothy Powell, [name] Phone: [phone] Subjective:   [name], am serving as a scribe for Dr. Hulan Saas.   CC: left shoulder pain   HGD:[id number]  Timothy Powell is a 42 y.o. male coming in with complaint of left shoulder pain. Is having pain in poster aspect of left shoulder. Has tried to slow down in the gym and has tried HEP exercises. Cannot pinpoint an injury but has had pain for 4 months. Denies any radiating symptoms. Not doing anything with his arms for one day. Did 45# bicep curls yesterday and felt pain in back of shoulder.        Past Medical History:  Diagnosis Date  . Cauda equina syndrome ([place])   . Hypogonadism in male    Past Surgical History:  Procedure Laterality Date  . ankle muscle and skin graft Right 1997  . ANTERIOR CERVICAL DECOMP/DISCECTOMY FUSION N/A 03/23/2017   Procedure: Anterior Cervical Decompresion/Discectomy Fusion - Cervical Four-Cervical five - Cervical five-Cervical six - Cervical six-Cervical seven;  Surgeon: [name], MD;  Location: [facility];  Service: Neurosurgery;  Laterality: N/A;  . BACK SURGERY  1997   fusion l4/5,  . ELBOW SURGERY Right 2017   repair tear  . 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 removal of skin and muscle graft Right 1997   infection  . repair bicep tear Right 2015  . SHOULDER SURGERY  2011  . skin grafts Right    right ankle and arm   Social History   Socioeconomic History  . Marital status: Single    Spouse name: Not on file  . Number of children: Not on file  . Years of education: Not on file  . Highest education level: Not on file  Occupational History  . Not on file  Social Needs  . Financial resource strain: Not on file  . Food insecurity:    Worry: Not on file    Inability: Not on file  . Transportation needs:    Medical: Not on file    Non-medical: Not on file  Tobacco Use  . Smoking status: Never Smoker  . Smokeless  tobacco: Never Used  Substance and Sexual Activity  . Alcohol use: Not Currently    Alcohol/week: 0.0 standard drinks    Comment: social  . Drug use: No  . Sexual activity: Not on file  Lifestyle  . Physical activity:    Days per week: Not on file    Minutes per session: Not on file  . Stress: Not on file  Relationships  . Social connections:    Talks on phone: Not on file    Gets together: Not on file    Attends religious service: Not on file    Active member of club or organization: Not on file    Attends meetings of clubs or organizations: Not on file    Relationship status: Not on file  Other Topics Concern  . Not on file  Social History Narrative  . Not on file   No Known Allergies Family History  Problem Relation Age of Onset  . Heart disease Maternal Grandfather   . Diabetes Paternal Grandmother   . Pancreatic cancer Paternal Grandmother     Current Outpatient Medications (Endocrine & Metabolic):  [name]  Testosterone 10 MG/ACT (2%) GEL, Apply 7 Pump topically See admin instructions. Apply 7 pumps to legs daily    Current Outpatient  Medications (Analgesics):  .  diclofenac (VOLTAREN) 50 MG EC tablet, Take 1 tablet (50 mg total) by mouth 2 (two) times daily as needed. [name]  ibuprofen (ADVIL,MOTRIN) 200 MG tablet, Take 800 mg by mouth every 8 (eight) hours as needed.    Current Outpatient Medications (Other):  [name]  ALPRAZolam (XANAX) 0.5 MG tablet, Take 0.25 mg by mouth at bedtime as needed for anxiety.  .  Misc Natural Products (GLUCOSAMINE CHOND COMPLEX/MSM) TABS, Take 2 tablets by mouth daily. .  Multiple Vitamin (MULTI-VITAMINS) TABS, Take 2 tablets by mouth daily. [name] MEN    Past medical history, social, surgical and family history all reviewed in electronic medical record.  No pertanent information unless stated regarding to the chief complaint.   Review of Systems:  No headache, visual changes, nausea, vomiting, diarrhea, constipation, dizziness, abdominal  pain, skin rash, fevers, chills, night sweats, weight loss, swollen lymph nodes, body aches, joint swelling, chest pain, shortness of breath, mood changes.  Mild positive muscle aches Objective  Blood pressure (!) 140/100, pulse 80, height 5\' 10"  (1.778 m), weight 201 lb (91.2 kg), SpO2 97 %.   General: No apparent distress alert and oriented x3 mood and affect normal, dressed appropriately.  HEENT: Pupils equal, extraocular movements intact  Respiratory: Patient's speak in full sentences and does not appear short of breath  Cardiovascular: No lower extremity edema, non tender, no erythema  Skin: Warm dry intact with no signs of infection or rash on extremities or on axial skeleton.  Abdomen: Soft nontender  Neuro: Cranial nerves II through XII are intact, neurovascularly intact in all extremities with 2+ DTRs and 2+ pulses.  Lymph: No lymphadenopathy of posterior or anterior cervical chain or axillae bilaterally.  Gait normal with good balance and coordination.  MSK:  Non tender with full range of motion and good stability and symmetric strength and tone of  elbows, wrist, hip, knee and ankles bilaterally.  Atrophy of the lower extremity noted Neck exam does have loss of lordosis but no positive Spurling's.  This is improved from patient's previous exam.  Shoulder: Left Inspection reveals no abnormalities, atrophy or asymmetry. Palpation is normal with no tenderness over AC joint or bicipital groove. ROM is full in all planes. Rotator cuff strength normal throughout. Positive impingement. Speeds and Yergason's tests normal. O'Brien's Normal scapular function observed. Positive painful arc No apprehension sign Contralateral shoulder unremarkable  MSK US performed of: Left shoulder This study was ordered, performed, and interpreted by Charlann Boxer D.O.  Shoulder:   Supraspinatus: Hypoechoic changes noted but no true tear  Teres Minor:  Appears normal on long and transverse views. AC  joint:  Capsule undistended, no geyser sign. Glenohumeral Joint:  Appears normal without effusion. Glenoid Labrum: Significant calcific changes or scar tissue formation posteriorly. Biceps Tendon:  Appears normal on long and transverse views, no fraying of tendon, tendon located in intertubercular groove, no subluxation with shoulder internal or external rotation. No increased power doppler signal. Impression: Possible labral pathology  Procedure: Real-time Ultrasound Guided Injection of left glenohumeral joint Device: GE Logiq E  Ultrasound guided injection is preferred based studies that show increased duration, increased effect, greater accuracy, decreased procedural pain, increased response rate with ultrasound guided versus blind injection.  Verbal informed consent obtained.  Time-out conducted.  Noted no overlying erythema, induration, or other signs of local infection.  Skin prepped in a sterile fashion.  Local anesthesia: Topical Ethyl chloride.  With sterile technique and under real time ultrasound guidance:  Joint visualized.  21g 2 inch needle inserted posterior approach. Pictures taken for needle placement. Patient did have injection of 2 cc of 0.5% Marcaine, and 1cc of Kenalog 40 mg/dL. Completed without difficulty  Pain immediately resolved suggesting accurate placement of the medication.  Advised to call if fevers/chills, erythema, induration, drainage, or persistent bleeding.  Images permanently stored and available for review in the ultrasound unit.  Impression: Technically successful ultrasound guided injection.   Impression and Recommendations:     This case required medical decision making of moderate complexity. The above documentation has been reviewed and is accurate and complete Lyndal Pulley, DO       Note: This dictation was prepared with Dragon dictation along with smaller phrase technology. Any transcriptional errors that result from this process are  unintentional.

## 2018-06-15 ENCOUNTER — Ambulatory Visit (INDEPENDENT_AMBULATORY_CARE_PROVIDER_SITE_OTHER)
Admission: RE | Admit: 2018-06-15 | Discharge: 2018-06-15 | Disposition: A | Discharge status: Home / Self Care | Payer: Medicare Other | Source: Ambulatory Visit | Attending: Family Medicine | Admitting: Family Medicine

## 2018-06-15 ENCOUNTER — Encounter

## 2018-06-15 ENCOUNTER — Ambulatory Visit: Payer: Self-pay

## 2018-06-15 ENCOUNTER — Ambulatory Visit (INDEPENDENT_AMBULATORY_CARE_PROVIDER_SITE_OTHER): Payer: Medicare Other | Admitting: Family Medicine

## 2018-06-15 VITALS — BP 140/100 | HR 80 | Ht 70.0 in | Wt 201.0 lb

## 2018-06-15 DIAGNOSIS — M25512 Pain in left shoulder: Secondary | ICD-10-CM

## 2018-06-15 DIAGNOSIS — G8929 Other chronic pain: Secondary | ICD-10-CM | POA: Diagnosis not present

## 2018-06-15 DIAGNOSIS — S42032A Displaced fracture of lateral end of left clavicle, initial encounter for closed fracture: Secondary | ICD-10-CM | POA: Diagnosis not present

## 2018-06-15 DIAGNOSIS — S43432A Superior glenoid labrum lesion of left shoulder, initial encounter: Secondary | ICD-10-CM | POA: Diagnosis not present

## 2018-06-15 NOTE — Assessment & Plan Note (Signed)
Patient given injection today.  Tolerated the procedure well.  Discussed icing regimen and home exercise.  Discussed which activities to do which wants to avoid.  X-rays of the shoulder ordered as well.  Patient has had cervical radiculopathy previously but does not appear to have any difficulty at this time.  Topical anti-inflammatories should be beneficial.  Hold on the nitroglycerin.  Follow-up again in 4 weeks

## 2018-06-15 NOTE — Patient Instructions (Signed)
Good to see you  Ice is your friend Try to keep hands within [peripheral vision.  pennsaid pinkie amount topically 2 times daily as needed.  We will hold on the nitro for now.  Exercises 3 times a week.  See me again in 4ish week if not perfect

## 2018-12-16 ENCOUNTER — Emergency Department
Admission: EM | Admit: 2018-12-16 | Discharge: 2018-12-16 | Disposition: A | Discharge status: Home / Self Care | Payer: Medicare Other | Attending: Emergency Medicine | Admitting: Emergency Medicine

## 2018-12-16 ENCOUNTER — Other Ambulatory Visit: Payer: Self-pay

## 2018-12-16 ENCOUNTER — Encounter: Payer: Self-pay | Admitting: Emergency Medicine

## 2018-12-16 DIAGNOSIS — Y9289 Other specified places as the place of occurrence of the external cause: Secondary | ICD-10-CM | POA: Insufficient documentation

## 2018-12-16 DIAGNOSIS — Y998 Other external cause status: Secondary | ICD-10-CM | POA: Diagnosis not present

## 2018-12-16 DIAGNOSIS — S61212A Laceration without foreign body of right middle finger without damage to nail, initial encounter: Secondary | ICD-10-CM | POA: Insufficient documentation

## 2018-12-16 DIAGNOSIS — W298XXA Contact with other powered powered hand tools and household machinery, initial encounter: Secondary | ICD-10-CM | POA: Insufficient documentation

## 2018-12-16 DIAGNOSIS — Y9389 Activity, other specified: Secondary | ICD-10-CM | POA: Diagnosis not present

## 2018-12-16 DIAGNOSIS — S6991XA Unspecified injury of right wrist, hand and finger(s), initial encounter: Secondary | ICD-10-CM | POA: Diagnosis present

## 2018-12-16 MED ORDER — CEPHALEXIN 500 MG PO CAPS: 500.0000 mg | ORAL_CAPSULE | Freq: Three times a day (TID) | ORAL | 0 refills | Status: DC

## 2018-12-16 NOTE — Discharge Instructions (Signed)
Follow-up with your regular doctor or return emergency department for suture removal in 1 week.  However if you feel comfortable removing your own sutures you may do so.  If there is any sign of infection you should see your regular doctor or orthopedics.  Phone numbers been provided for you or the on-call orthopedic.  Keep the areas clean and dry as possible.  Take the medication as prescribed

## 2018-12-16 NOTE — ED Notes (Signed)
Says he was using a press and reached in and it came down on his right middle finger.  Has avulsion to pad of finger.

## 2018-12-16 NOTE — ED Triage Notes (Signed)
Pt states lac to third digit on right hand while working at home. Finger wrapped with gauze from home, bleeding appears to be controlled at this time.

## 2018-12-16 NOTE — ED Provider Notes (Signed)
[facility] Emergency Department Provider Note  ____________________________________________   First MD Initiated Contact with Patient 12/16/18 1426     (approximate)  I have reviewed the triage vital signs and the nursing notes.   HISTORY  Chief Complaint Laceration    HPI Timothy Powell is a 43 y.o. male presents emergency department with complaints of a right finger laceration.  He states he cut his finger while working in his shop.  The press came down has a end like a cookie cutter and cut the soft tissue of the finger.  He is not feeling he has a fracture.  Tdap is up-to-date.    Past Medical History:  Diagnosis Date  . Cauda equina syndrome ([place])   . Hypogonadism in male     Patient Active Problem List   Diagnosis Date Noted  . Glenoid labral tear, left, initial encounter 06/15/2018  . Cauda equina syndrome ([place]) 12/02/2017  . Sacral pain 12/02/2017  . Neuropathic pain 12/02/2017  . HNP (herniated nucleus pulposus), cervical 03/23/2017  . Nonallopathic lesion of lumbosacral region 11/18/2016  . Piriformis syndrome of right side 11/03/2016  . Degenerative cervical disc 10/16/2016  . Nonallopathic lesion of cervical region 10/16/2016  . Nonallopathic lesion of thoracic region 10/16/2016  . Nonallopathic lesion of sacral region 10/16/2016  . Leg length discrepancy 10/16/2016  . Lateral epicondylitis of right elbow 10/23/2015  . 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 Blood pressure elevated 08/25/2011  . Anxiety, generalized 04/19/2009  . Abnormal kidney function study 11/29/2007    Past Surgical History:  Procedure Laterality Date  . ankle muscle and skin graft Right 1997  . ANTERIOR CERVICAL DECOMP/DISCECTOMY FUSION N/A 03/23/2017   Procedure: Anterior Cervical Decompresion/Discectomy Fusion - Cervical Four-Cervical five - Cervical five-Cervical six - Cervical six-Cervical seven;  Surgeon: [name], MD;  Location: [facility];   Service: Neurosurgery;  Laterality: N/A;  . BACK SURGERY  1997   fusion l4/5,  . ELBOW SURGERY Right 2017   repair tear  . 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 removal of skin and muscle graft Right 1997   infection  . repair bicep tear Right 2015  . SHOULDER SURGERY  2011  . skin grafts Right    right ankle and arm    Prior to Admission medications   Medication Sig Start Date End Date Taking? Authorizing Provider  ALPRAZolam Duanne Moron) 0.5 MG tablet Take 0.25 mg by mouth at bedtime as needed for anxiety.     [provider]  cephALEXin (KEFLEX) 500 MG capsule Take 1 capsule (500 mg total) by mouth 3 (three) times daily. 12/16/18   Wyolene Weimann, Linden Dolin, PA-C  diclofenac (VOLTAREN) 50 MG EC tablet Take 1 tablet (50 mg total) by mouth 2 (two) times daily as needed. 01/20/18   Meredith Staggers, MD  ibuprofen (ADVIL,MOTRIN) 200 MG tablet Take 800 mg by mouth every 8 (eight) hours as needed.     [provider]  Misc Natural Products (GLUCOSAMINE CHOND COMPLEX/MSM) TABS Take 2 tablets by mouth daily.    [provider]  Multiple Vitamin (MULTI-VITAMINS) TABS Take 2 tablets by mouth daily. [place] MEN    [provider]  Testosterone 10 MG/ACT (2%) GEL Apply 7 Pump topically See admin instructions. Apply 7 pumps to legs daily 02/02/17   [provider]    Allergies Patient has no known allergies.  Family History  Problem Relation Age of Onset  . Heart disease  Maternal Grandfather   . Diabetes Paternal Grandmother   . Pancreatic cancer Paternal Grandmother     Social History Social History   Tobacco Use  . Smoking status: Never Smoker  . Smokeless tobacco: Never Used  Substance Use Topics  . Alcohol use: Not Currently    Alcohol/week: 0.0 standard drinks    Comment: social  . Drug use: No    Review of Systems  Constitutional: No fever/chills Eyes: No visual changes. ENT: No sore throat. Respiratory: Denies cough Genitourinary: Negative  for dysuria. Musculoskeletal: Negative for back pain.  Right middle finger laceration Skin: Negative for rash.    ____________________________________________   PHYSICAL EXAM:  VITAL SIGNS: ED Triage Vitals  Enc Vitals Group     BP 12/16/18 1421 131/84     Pulse --      Resp 12/16/18 1421 18     Temp 12/16/18 1422 97.8 F (36.6 C)     Temp Source 12/16/18 1422 Oral     SpO2 --      Weight 12/16/18 1422 200 lb (90.7 kg)     Height 12/16/18 1422 5\' 10"  (1.778 m)     Head Circumference --      Peak Flow --      Pain Score 12/16/18 1421 2     Pain Loc --      Pain Edu? --      Excl. in [place]? --     Constitutional: Alert and oriented. Well appearing and in no acute distress. Eyes: Conjunctivae are normal.  Head: Atraumatic. Nose: No congestion/rhinnorhea. Mouth/Throat: Mucous membranes are moist.   Neck:  supple no lymphadenopathy noted Cardiovascular: Normal rate, regular rhythm.  Respiratory: Normal respiratory effort.  No retractions GU: deferred Musculoskeletal: FROM all extremities, warm and well perfused, positive for 1.5 cm laceration to the pad of the right middle finger.  Fingers are very dusty with brass dust.  Full range of motion of the finger.  Neurovascular intact Neurologic:  Normal speech and language.  Skin:  Skin is warm, dry . No rash noted. Psychiatric: Mood and affect are normal. Speech and behavior are normal.  ____________________________________________   LABS (all labs ordered are listed, but only abnormal results are displayed)  Labs Reviewed - No data to display ____________________________________________   ____________________________________________  RADIOLOGY    ____________________________________________   PROCEDURES  Procedure(s) performed:   Marland KitchenMarland KitchenLaceration Repair Date/Time: 12/16/2018 3:09 PM Performed by: Versie Starks, PA-C Authorized by: Versie Starks, PA-C   Consent:    Consent obtained:  Verbal   Consent given  by:  Patient   Risks discussed:  Infection, pain, retained foreign body and poor wound healing   Alternatives discussed:  Observation Anesthesia (see MAR for exact dosages):    Anesthesia method:  Nerve block   Block needle gauge:  27 G   Block anesthetic:  Lidocaine 1% w/o epi   Block injection procedure:  Anatomic landmarks identified, introduced needle, incremental injection, anatomic landmarks palpated and negative aspiration for blood   Block outcome:  Anesthesia achieved Laceration details:    Location:  Finger   Finger location:  R long finger   Length (cm):  1.5   Depth (mm):  4 Repair type:    Repair type:  Intermediate Pre-procedure details:    Preparation:  Patient was prepped and draped in usual sterile fashion Exploration:    Hemostasis achieved with:  Direct pressure   Wound exploration: wound explored through full range of motion  Wound extent: foreign bodies/material     Wound extent: no nerve damage noted, no tendon damage noted and no underlying fracture noted     Contaminated: yes   Treatment:    Area cleansed with:  Betadine and saline   Amount of cleaning:  Extensive   Irrigation solution:  Sterile saline   Irrigation method:  Pressure wash, syringe and tap   Visualized foreign bodies/material removed: yes   Skin repair:    Repair method:  Sutures   Suture size:  5-0   Suture material:  Nylon   Suture technique:  Simple interrupted   Number of sutures:  4 Post-procedure details:    Dressing:  Non-adherent dressing   Patient tolerance of procedure:  Tolerated well, no immediate complications      ____________________________________________   INITIAL IMPRESSION / ASSESSMENT AND PLAN / ED COURSE  Pertinent labs & imaging results that were available during my care of the patient were reviewed by me and considered in my medical decision making (see chart for details).   Patient is 42 year old male presents emergency department with laceration to  the right middle finger.  Right middle finger has a 1.5 cm laceration.  Area was soaked in Betadine and saline for 10 minutes.  See procedure note for repair.   Patient was given care instructions.  Bandage was applied by nursing staff.  He was given a prescription for Keflex 500 3 times daily due to the amount of copper dust on his hand.  Care instructions were given.  The patient had deferred the x-ray at this time.  He was instructed to follow-up with his regular doctor or orthopedics if any sign of infection or increased pain.  He states he understands will comply.  He was discharged in stable condition.  As part of my medical decision making, I reviewed the following data within the [facility] notes reviewed and incorporated, Old chart reviewed, Notes from prior ED visits and [place] Controlled Substance Database  ____________________________________________   FINAL CLINICAL IMPRESSION(S) / ED DIAGNOSES  Final diagnoses:  Laceration of right middle finger without foreign body without damage to nail, initial encounter      NEW MEDICATIONS STARTED DURING THIS VISIT:  Current Discharge Medication List    START taking these medications   Details  cephALEXin (KEFLEX) 500 MG capsule Take 1 capsule (500 mg total) by mouth 3 (three) times daily. Qty: 21 capsule, Refills: 0         Note:  This document was prepared using Dragon voice recognition software and may include unintentional dictation errors.    Versie Starks, PA-C 12/16/18 1512    Nance Pear, MD 12/16/18 [phone]

## 2019-04-14 DIAGNOSIS — M62830 Muscle spasm of back: Secondary | ICD-10-CM | POA: Diagnosis not present

## 2019-04-14 DIAGNOSIS — M5414 Radiculopathy, thoracic region: Secondary | ICD-10-CM | POA: Diagnosis not present

## 2019-04-14 DIAGNOSIS — M9902 Segmental and somatic dysfunction of thoracic region: Secondary | ICD-10-CM | POA: Diagnosis not present

## 2019-04-14 DIAGNOSIS — M9901 Segmental and somatic dysfunction of cervical region: Secondary | ICD-10-CM | POA: Diagnosis not present

## 2019-04-18 DIAGNOSIS — M62830 Muscle spasm of back: Secondary | ICD-10-CM | POA: Diagnosis not present

## 2019-04-18 DIAGNOSIS — M5414 Radiculopathy, thoracic region: Secondary | ICD-10-CM | POA: Diagnosis not present

## 2019-04-18 DIAGNOSIS — M9901 Segmental and somatic dysfunction of cervical region: Secondary | ICD-10-CM | POA: Diagnosis not present

## 2019-04-18 DIAGNOSIS — M9902 Segmental and somatic dysfunction of thoracic region: Secondary | ICD-10-CM | POA: Diagnosis not present

## 2019-05-09 DIAGNOSIS — M5414 Radiculopathy, thoracic region: Secondary | ICD-10-CM | POA: Diagnosis not present

## 2019-05-09 DIAGNOSIS — M9902 Segmental and somatic dysfunction of thoracic region: Secondary | ICD-10-CM | POA: Diagnosis not present

## 2019-05-09 DIAGNOSIS — M62830 Muscle spasm of back: Secondary | ICD-10-CM | POA: Diagnosis not present

## 2019-05-09 DIAGNOSIS — M9901 Segmental and somatic dysfunction of cervical region: Secondary | ICD-10-CM | POA: Diagnosis not present

## 2019-05-10 DIAGNOSIS — M9901 Segmental and somatic dysfunction of cervical region: Secondary | ICD-10-CM | POA: Diagnosis not present

## 2019-05-10 DIAGNOSIS — M5414 Radiculopathy, thoracic region: Secondary | ICD-10-CM | POA: Diagnosis not present

## 2019-05-10 DIAGNOSIS — M9902 Segmental and somatic dysfunction of thoracic region: Secondary | ICD-10-CM | POA: Diagnosis not present

## 2019-05-10 DIAGNOSIS — M62830 Muscle spasm of back: Secondary | ICD-10-CM | POA: Diagnosis not present

## 2019-05-17 DIAGNOSIS — M62830 Muscle spasm of back: Secondary | ICD-10-CM | POA: Diagnosis not present

## 2019-05-17 DIAGNOSIS — M5414 Radiculopathy, thoracic region: Secondary | ICD-10-CM | POA: Diagnosis not present

## 2019-05-17 DIAGNOSIS — M9902 Segmental and somatic dysfunction of thoracic region: Secondary | ICD-10-CM | POA: Diagnosis not present

## 2019-05-17 DIAGNOSIS — M9901 Segmental and somatic dysfunction of cervical region: Secondary | ICD-10-CM | POA: Diagnosis not present

## 2019-05-19 DIAGNOSIS — M9901 Segmental and somatic dysfunction of cervical region: Secondary | ICD-10-CM | POA: Diagnosis not present

## 2019-05-19 DIAGNOSIS — M9902 Segmental and somatic dysfunction of thoracic region: Secondary | ICD-10-CM | POA: Diagnosis not present

## 2019-05-19 DIAGNOSIS — M5414 Radiculopathy, thoracic region: Secondary | ICD-10-CM | POA: Diagnosis not present

## 2019-05-19 DIAGNOSIS — M62830 Muscle spasm of back: Secondary | ICD-10-CM | POA: Diagnosis not present

## 2019-05-23 DIAGNOSIS — M9901 Segmental and somatic dysfunction of cervical region: Secondary | ICD-10-CM | POA: Diagnosis not present

## 2019-05-23 DIAGNOSIS — M62830 Muscle spasm of back: Secondary | ICD-10-CM | POA: Diagnosis not present

## 2019-05-23 DIAGNOSIS — M5414 Radiculopathy, thoracic region: Secondary | ICD-10-CM | POA: Diagnosis not present

## 2019-05-23 DIAGNOSIS — M9902 Segmental and somatic dysfunction of thoracic region: Secondary | ICD-10-CM | POA: Diagnosis not present

## 2019-05-24 DIAGNOSIS — M4722 Other spondylosis with radiculopathy, cervical region: Secondary | ICD-10-CM | POA: Insufficient documentation

## 2019-05-26 DIAGNOSIS — M5414 Radiculopathy, thoracic region: Secondary | ICD-10-CM | POA: Diagnosis not present

## 2019-05-26 DIAGNOSIS — M9901 Segmental and somatic dysfunction of cervical region: Secondary | ICD-10-CM | POA: Diagnosis not present

## 2019-05-26 DIAGNOSIS — M62830 Muscle spasm of back: Secondary | ICD-10-CM | POA: Diagnosis not present

## 2019-05-26 DIAGNOSIS — M9902 Segmental and somatic dysfunction of thoracic region: Secondary | ICD-10-CM | POA: Diagnosis not present

## 2019-05-27 DIAGNOSIS — R030 Elevated blood-pressure reading, without diagnosis of hypertension: Secondary | ICD-10-CM | POA: Diagnosis not present

## 2019-05-27 DIAGNOSIS — M4722 Other spondylosis with radiculopathy, cervical region: Secondary | ICD-10-CM | POA: Diagnosis not present

## 2019-05-27 DIAGNOSIS — R29898 Other symptoms and signs involving the musculoskeletal system: Secondary | ICD-10-CM | POA: Diagnosis not present

## 2019-05-27 DIAGNOSIS — M5412 Radiculopathy, cervical region: Secondary | ICD-10-CM | POA: Diagnosis not present

## 2019-05-27 DIAGNOSIS — Z981 Arthrodesis status: Secondary | ICD-10-CM | POA: Diagnosis not present

## 2019-05-27 DIAGNOSIS — Z6831 Body mass index (BMI) 31.0-31.9, adult: Secondary | ICD-10-CM | POA: Diagnosis not present

## 2019-05-30 ENCOUNTER — Other Ambulatory Visit: Payer: Self-pay | Admitting: Neurosurgery

## 2019-05-30 ENCOUNTER — Other Ambulatory Visit: Payer: Self-pay

## 2019-05-30 ENCOUNTER — Ambulatory Visit
Admission: RE | Admit: 2019-05-30 | Discharge: 2019-05-30 | Disposition: A | Discharge status: Home / Self Care | Payer: Medicare Other | Source: Ambulatory Visit | Attending: Neurosurgery | Admitting: Neurosurgery

## 2019-05-30 DIAGNOSIS — M5021 Other cervical disc displacement,  high cervical region: Secondary | ICD-10-CM | POA: Diagnosis not present

## 2019-05-30 DIAGNOSIS — Z981 Arthrodesis status: Secondary | ICD-10-CM

## 2019-06-01 DIAGNOSIS — M542 Cervicalgia: Secondary | ICD-10-CM | POA: Diagnosis not present

## 2019-06-01 DIAGNOSIS — M5412 Radiculopathy, cervical region: Secondary | ICD-10-CM | POA: Diagnosis not present

## 2019-06-03 DIAGNOSIS — R29898 Other symptoms and signs involving the musculoskeletal system: Secondary | ICD-10-CM | POA: Diagnosis not present

## 2019-06-03 DIAGNOSIS — M4722 Other spondylosis with radiculopathy, cervical region: Secondary | ICD-10-CM | POA: Diagnosis not present

## 2019-06-03 DIAGNOSIS — Z6829 Body mass index (BMI) 29.0-29.9, adult: Secondary | ICD-10-CM | POA: Diagnosis not present

## 2019-06-03 DIAGNOSIS — M5020 Other cervical disc displacement, unspecified cervical region: Secondary | ICD-10-CM | POA: Diagnosis not present

## 2019-06-03 DIAGNOSIS — Z981 Arthrodesis status: Secondary | ICD-10-CM | POA: Diagnosis not present

## 2019-06-03 DIAGNOSIS — M5030 Other cervical disc degeneration, unspecified cervical region: Secondary | ICD-10-CM | POA: Diagnosis not present

## 2019-06-03 DIAGNOSIS — R030 Elevated blood-pressure reading, without diagnosis of hypertension: Secondary | ICD-10-CM | POA: Diagnosis not present

## 2019-06-03 DIAGNOSIS — M5412 Radiculopathy, cervical region: Secondary | ICD-10-CM | POA: Diagnosis not present

## 2019-06-06 ENCOUNTER — Other Ambulatory Visit: Payer: Self-pay | Admitting: Neurosurgery

## 2019-06-08 ENCOUNTER — Other Ambulatory Visit: Payer: Self-pay | Admitting: Neurosurgery

## 2019-06-10 NOTE — Progress Notes (Signed)
[facility] [id number], [place] AT [facility] OF [address] [place] [address] Phone: [phone] Fax: [phone]      Your procedure is scheduled on Wednesday 06/15/2019.  Report to [facility] Main Entrance "A" at 09:00 A.M., and check in at the Admitting office.  Call this number if you have problems the morning of surgery:  [phone]  Call [phone] if you have any questions prior to your surgery date Monday-Friday 8am-4pm    Remember:  Do not eat or drink after midnight the night before your surgery   Take these medicines the morning of surgery with A SIP OF WATER: Tramadol (Ultram) - if needed for pain  7 days prior to surgery STOP taking any Aspirin (unless otherwise instructed by your surgeon), Aleve, Naproxen, Ibuprofen, Motrin, Advil, Goody's, BC's, all herbal medications, fish oil, and all vitamins.    The Morning of Surgery  Do not wear jewelry.  Do not wear lotions, powders, or perfumes/colognes, or deodorant  Men may shave face and neck.  Do not bring valuables to the hospital.  [facility] is not responsible for any belongings or valuables.  If you are a smoker, DO NOT Smoke 24 hours prior to surgery  If you wear a CPAP at night please bring your mask, tubing, and machine the morning of surgery   Remember that you must have someone to transport you home after your surgery, and remain with you for 24 hours if you are discharged the same day.   Contacts, glasses, hearing aids, dentures or bridgework may not be worn into surgery.    Leave your suitcase in the car.  After surgery it may be brought to your room.  For patients admitted to the hospital, discharge time will be determined by your treatment team.  Patients discharged the day of surgery will not be allowed to drive home.    Special instructions:   [facility]- Preparing For Surgery  Before surgery, you can play an important role.  Because skin is not sterile, your skin needs to be as free of germs as possible. You can reduce the number of germs on your skin by washing with CHG (chlorahexidine gluconate) Soap before surgery.  CHG is an antiseptic cleaner which kills germs and bonds with the skin to continue killing germs even after washing.    Oral Hygiene is also important to reduce your risk of infection.  Remember - BRUSH YOUR TEETH THE MORNING OF SURGERY WITH YOUR REGULAR TOOTHPASTE  Please do not use if you have an allergy to CHG or antibacterial soaps. If your skin becomes reddened/irritated stop using the CHG.  Do not shave (including legs and underarms) for at least 48 hours prior to first CHG shower. It is OK to shave your face.  Please follow these instructions carefully.   1. Shower the NIGHT BEFORE SURGERY and the MORNING OF SURGERY with CHG Soap.   2. If you chose to wash your hair, wash your hair first as usual with your normal shampoo.  3. After you shampoo, rinse your hair and body thoroughly to remove the shampoo.  4. Use CHG as you would any other liquid soap. You can apply CHG directly to the skin and wash gently with a scrungie or a clean washcloth.   5. Apply the CHG Soap to your body ONLY FROM THE NECK DOWN.  Do not use on open wounds or open  sores. Avoid contact with your eyes, ears, mouth and genitals (private parts). Wash Face and genitals (private parts)  with your normal soap.   6. Wash thoroughly, paying special attention to the area where your surgery will be performed.  7. Thoroughly rinse your body with warm water from the neck down.  8. DO NOT shower/wash with your normal soap after using and rinsing off the CHG Soap.  9. Pat yourself dry with a CLEAN TOWEL.  10. Wear CLEAN PAJAMAS to bed the night before surgery, wear comfortable clothes the morning of surgery  11. Place CLEAN SHEETS on your bed the night of your first shower and DO NOT SLEEP WITH PETS.    Day of  Surgery:  Please shower the morning of surgery with the CHG soap  Do not apply any deodorants/lotions. Please wear clean clothes to the hospital/surgery center.   Remember to brush your teeth WITH YOUR REGULAR TOOTHPASTE.   Please read over the following fact sheets that you were given.

## 2019-06-13 ENCOUNTER — Encounter (HOSPITAL_COMMUNITY): Payer: Self-pay

## 2019-06-13 ENCOUNTER — Other Ambulatory Visit: Payer: Self-pay

## 2019-06-13 ENCOUNTER — Encounter (HOSPITAL_COMMUNITY)
Admission: RE | Admit: 2019-06-13 | Discharge: 2019-06-13 | Disposition: A | Discharge status: Home / Self Care | Payer: Medicare Other | Source: Ambulatory Visit | Attending: Neurosurgery | Admitting: Neurosurgery

## 2019-06-13 ENCOUNTER — Other Ambulatory Visit (HOSPITAL_COMMUNITY)
Admission: RE | Admit: 2019-06-13 | Discharge: 2019-06-13 | Disposition: A | Discharge status: Home / Self Care | Payer: Medicare Other | Source: Ambulatory Visit | Attending: Neurosurgery | Admitting: Neurosurgery

## 2019-06-13 DIAGNOSIS — Z01818 Encounter for other preprocedural examination: Secondary | ICD-10-CM | POA: Diagnosis not present

## 2019-06-13 DIAGNOSIS — Z981 Arthrodesis status: Secondary | ICD-10-CM | POA: Diagnosis not present

## 2019-06-13 DIAGNOSIS — E230 Hypopituitarism: Secondary | ICD-10-CM | POA: Insufficient documentation

## 2019-06-13 DIAGNOSIS — M5020 Other cervical disc displacement, unspecified cervical region: Secondary | ICD-10-CM | POA: Insufficient documentation

## 2019-06-13 DIAGNOSIS — I10 Essential (primary) hypertension: Secondary | ICD-10-CM | POA: Diagnosis not present

## 2019-06-13 DIAGNOSIS — E039 Hypothyroidism, unspecified: Secondary | ICD-10-CM | POA: Insufficient documentation

## 2019-06-13 DIAGNOSIS — Z79899 Other long term (current) drug therapy: Secondary | ICD-10-CM | POA: Insufficient documentation

## 2019-06-13 HISTORY — DX: Essential (primary) hypertension: I10

## 2019-06-13 LAB — BASIC METABOLIC PANEL
Anion gap: 8 (ref 5–15)
BUN: 13 mg/dL (ref 6–20)
CO2: 27 mmol/L (ref 22–32)
Calcium: 9.1 mg/dL (ref 8.9–10.3)
Chloride: 103 mmol/L (ref 98–111)
Creatinine, Ser: 1.38 mg/dL — ABNORMAL HIGH (ref 0.61–1.24)
GFR calc Af Amer: >60 mL/min (ref >60)
GFR calc non Af Amer: >60 mL/min (ref >60)
Glucose, Bld: 101 mg/dL — ABNORMAL HIGH (ref 70–99)
Potassium: 4 mmol/L (ref 3.5–5.1)
Sodium: 138 mmol/L (ref 135–145)

## 2019-06-13 LAB — TYPE AND SCREEN
ABO/RH(D): O POS
Antibody Screen: NEGATIVE

## 2019-06-13 LAB — CBC
HCT: 52.9 % — ABNORMAL HIGH (ref 39.0–52.0)
Hemoglobin: 17.8 g/dL — ABNORMAL HIGH (ref 13.0–17.0)
MCH: 30.2 pg (ref 26.0–34.0)
MCHC: 33.6 g/dL (ref 30.0–36.0)
MCV: 89.8 fL (ref 80.0–100.0)
Platelets: 244 10*3/uL (ref 150–400)
RBC: 5.89 MIL/uL — ABNORMAL HIGH (ref 4.22–5.81)
RDW: 11.7 % (ref 11.5–15.5)
WBC: 6.9 10*3/uL (ref 4.0–10.5)
nRBC: 0 % (ref 0.0–0.2)

## 2019-06-13 LAB — SURGICAL PCR SCREEN
MRSA, PCR: NEGATIVE
Staphylococcus aureus: NEGATIVE

## 2019-06-13 LAB — ABO/RH: ABO/RH(D): O POS

## 2019-06-13 LAB — SARS CORONAVIRUS 2 (TAT 6-24 HRS): SARS Coronavirus 2: NEGATIVE

## 2019-06-13 NOTE — Progress Notes (Signed)
PCP:  None Cardiologist:  None  EKG:  06/13/19 CXR:  N/A ECHO:  denies Stress Test:  denies Cardiac Cath:  denies  Anesthesia Review:  Yes, elevated BP.  Patient states was on BP meds 20 years ago.  Does not have PCP.  Obtained EKG.  Covid testing scheduled today 06/13/19  Patient denies shortness of breath, fever, cough, and chest pain at PAT appointment.  Patient verbalized understanding of instructions provided today at the PAT appointment.  Patient asked to review instructions at home and day of surgery.

## 2019-06-13 NOTE — Progress Notes (Signed)
Timothy Powell, [name] aware of BP.  Patient does not have PCP.  Obtained EKG.  Sent message to Dr. [name].

## 2019-06-14 NOTE — Anesthesia Preprocedure Evaluation (Addendum)
Anesthesia Evaluation  Patient identified by MRN, date of birth, ID band Patient awake    Reviewed: Allergy & Precautions, NPO status , Patient's Chart, lab work & pertinent test results  Airway Mallampati: II  TM Distance: >3 FB     Dental   Pulmonary    breath sounds clear to auscultation       Cardiovascular hypertension,  Rhythm:Regular Rate:Normal     Neuro/Psych Anxiety  Neuromuscular disease    GI/Hepatic negative GI ROS, Neg liver ROS,   Endo/Other  negative endocrine ROS  Renal/GU negative Renal ROS     Musculoskeletal  (+) Arthritis ,   Abdominal   Peds  Hematology   Anesthesia Other Findings   Reproductive/Obstetrics                           Anesthesia Physical Anesthesia Plan  ASA: III  Anesthesia Plan: General   Post-op Pain Management:    Induction: Intravenous  PONV Risk Score and Plan: 2 and Ondansetron, Midazolam and Dexamethasone  Airway Management Planned: Oral ETT  Additional Equipment:   Intra-op Plan:   Post-operative Plan: Possible Post-op intubation/ventilation  Informed Consent: I have reviewed the patients History and Physical, chart, labs and discussed the procedure including the risks, benefits and alternatives for the proposed anesthesia with the patient or authorized representative who has indicated his/her understanding and acceptance.     Dental advisory given  Plan Discussed with: Anesthesiologist and CRNA  Anesthesia Plan Comments: (See PAT note by Karoline Caldwell, PA-C )      Anesthesia Quick Evaluation

## 2019-06-14 NOTE — Progress Notes (Addendum)
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 Last TSH 12.126 on 05/17/18, however pt declines treatment for this.   Pt noted to have markedly elevated BP at PAT, 181/119, advised that uncontrolled HTN at this level would be cause for DOS cancellation. He does not have a PCP and says his BP is generally elevated. He is on no antiHTN medications.    Discussed cased with Dr. Roanna Banning. Due to uncontrolled HTN it is recommended pt be seen by primary care for eval and treatment prior to elective surgery. This recommendation was called to Dr. Donnella Bi surgery scheduler.   Addendum: Per Dr. [name], this patient requires urgent surgery tomorrow and should stay on the surgical schedule. Ability to proceed pending DOS eval and BP.  VS: BP (!) 181/119   Pulse 84   Temp 36.9 C (Oral)   Resp 20   Ht 5\' 10"  (1.778 m)   Wt 94.9 kg   SpO2 98%   BMI 30.02 kg/m   PROVIDERS: Patient, No Pcp Per  [place], Legrand Como, MD is Endocrinologist  LABS: Mild renal insufficiency, consistent with prior labs. (all labs ordered are listed, but only abnormal results are displayed)  Labs Reviewed  CBC - Abnormal; Notable for the following components:      Result Value   RBC 5.89 (*)    Hemoglobin 17.8 (*)    HCT 52.9 (*)    All other components within normal limits  BASIC METABOLIC PANEL - Abnormal; Notable for the following  components:   Glucose, Bld 101 (*)    Creatinine, Ser 1.38 (*)    All other components within normal limits  SURGICAL PCR SCREEN  TYPE AND SCREEN  ABO/RH     IMAGES: CT Neck 05/30/19: IMPRESSION: 1. Possible soft disc protrusion into the left lateral recess and proximal left neural foramen at C7-T1. 2. No other significant abnormalities.  Solid fusion from C4-C7.   EKG: 06/13/19: NSR. Rate 77.   CV: N/A  Past Medical History:  Diagnosis Date  . Cauda equina syndrome ([place])   . Hypertension   . Hypogonadism in male     Past Surgical History:  Procedure Laterality Date  . ankle muscle and skin graft Right 1997  . ANTERIOR CERVICAL DECOMP/DISCECTOMY FUSION N/A 03/23/2017   Procedure: Anterior Cervical Decompresion/Discectomy Fusion - Cervical Four-Cervical five - Cervical five-Cervical six - Cervical six-Cervical seven;  Surgeon: [name], MD;  Location: [place];  Service: Neurosurgery;  Laterality: N/A;  . BACK SURGERY  1997   fusion l4/5,  . ELBOW SURGERY Right 2017   repair tear  . 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 removal of skin and muscle graft Right 1997   infection  . repair bicep tear Right 2015  . SHOULDER SURGERY  2011  . skin grafts Right    right ankle and arm    MEDICATIONS: . ALPRAZolam (XANAX) 0.5 MG tablet  . ibuprofen (ADVIL,MOTRIN) 200 MG tablet  . magnesium oxide (MAG-OX) 400  MG tablet  . Misc Natural Products (GLUCOSAMINE CHOND COMPLEX/MSM) TABS  . Multiple Vitamin (MULTI-VITAMINS) TABS  . testosterone cypionate (DEPOTESTOTERONE CYPIONATE) 100 MG/ML injection  . traMADol (ULTRAM) 50 MG tablet   No current facility-administered medications for this encounter.      Wynonia Musty [facility] Short Stay Center/Anesthesiology Phone [phone] 06/14/2019 9:41 AM

## 2019-06-15 ENCOUNTER — Encounter (HOSPITAL_COMMUNITY)
Admission: RE | Disposition: A | Discharge status: Home / Self Care | Payer: Self-pay | Source: Ambulatory Visit | Attending: Neurosurgery

## 2019-06-15 ENCOUNTER — Ambulatory Visit (HOSPITAL_COMMUNITY): Payer: Medicare Other | Admitting: Physician Assistant

## 2019-06-15 ENCOUNTER — Observation Stay (HOSPITAL_COMMUNITY)
Admission: RE | Admit: 2019-06-15 | Discharge: 2019-06-16 | Disposition: A | Discharge status: Home / Self Care | Payer: Medicare Other | Source: Ambulatory Visit | Attending: Neurosurgery | Admitting: Neurosurgery

## 2019-06-15 ENCOUNTER — Ambulatory Visit (HOSPITAL_COMMUNITY): Payer: Medicare Other

## 2019-06-15 ENCOUNTER — Ambulatory Visit (HOSPITAL_COMMUNITY): Payer: Medicare Other | Admitting: Anesthesiology

## 2019-06-15 ENCOUNTER — Encounter (HOSPITAL_COMMUNITY): Payer: Self-pay

## 2019-06-15 ENCOUNTER — Other Ambulatory Visit: Payer: Self-pay

## 2019-06-15 DIAGNOSIS — Z419 Encounter for procedure for purposes other than remedying health state, unspecified: Secondary | ICD-10-CM

## 2019-06-15 DIAGNOSIS — M5013 Cervical disc disorder with radiculopathy, cervicothoracic region: Secondary | ICD-10-CM | POA: Diagnosis not present

## 2019-06-15 DIAGNOSIS — R200 Anesthesia of skin: Secondary | ICD-10-CM | POA: Diagnosis not present

## 2019-06-15 DIAGNOSIS — M62542 Muscle wasting and atrophy, not elsewhere classified, left hand: Secondary | ICD-10-CM | POA: Diagnosis not present

## 2019-06-15 DIAGNOSIS — M1990 Unspecified osteoarthritis, unspecified site: Secondary | ICD-10-CM | POA: Diagnosis not present

## 2019-06-15 DIAGNOSIS — I10 Essential (primary) hypertension: Secondary | ICD-10-CM | POA: Insufficient documentation

## 2019-06-15 DIAGNOSIS — M5020 Other cervical disc displacement, unspecified cervical region: Secondary | ICD-10-CM | POA: Diagnosis present

## 2019-06-15 DIAGNOSIS — Z981 Arthrodesis status: Secondary | ICD-10-CM | POA: Insufficient documentation

## 2019-06-15 DIAGNOSIS — Z791 Long term (current) use of non-steroidal anti-inflammatories (NSAID): Secondary | ICD-10-CM | POA: Insufficient documentation

## 2019-06-15 DIAGNOSIS — M4723 Other spondylosis with radiculopathy, cervicothoracic region: Secondary | ICD-10-CM | POA: Insufficient documentation

## 2019-06-15 DIAGNOSIS — G709 Myoneural disorder, unspecified: Secondary | ICD-10-CM | POA: Insufficient documentation

## 2019-06-15 DIAGNOSIS — G629 Polyneuropathy, unspecified: Secondary | ICD-10-CM | POA: Insufficient documentation

## 2019-06-15 DIAGNOSIS — M5412 Radiculopathy, cervical region: Secondary | ICD-10-CM | POA: Diagnosis not present

## 2019-06-15 DIAGNOSIS — F419 Anxiety disorder, unspecified: Secondary | ICD-10-CM | POA: Insufficient documentation

## 2019-06-15 DIAGNOSIS — G834 Cauda equina syndrome: Secondary | ICD-10-CM | POA: Diagnosis not present

## 2019-06-15 DIAGNOSIS — Z8249 Family history of ischemic heart disease and other diseases of the circulatory system: Secondary | ICD-10-CM | POA: Insufficient documentation

## 2019-06-15 DIAGNOSIS — Z79899 Other long term (current) drug therapy: Secondary | ICD-10-CM | POA: Insufficient documentation

## 2019-06-15 DIAGNOSIS — M5023 Other cervical disc displacement, cervicothoracic region: Secondary | ICD-10-CM | POA: Diagnosis not present

## 2019-06-15 DIAGNOSIS — M4322 Fusion of spine, cervical region: Secondary | ICD-10-CM | POA: Diagnosis not present

## 2019-06-15 HISTORY — PX: ANTERIOR CERVICAL DECOMP/DISCECTOMY FUSION: SHX1161

## 2019-06-15 SURGERY — ANTERIOR CERVICAL DECOMPRESSION/DISCECTOMY FUSION 1 LEVEL/HARDWARE REMOVAL
Anesthesia: General | Site: Neck

## 2019-06-15 MED ORDER — ACETAMINOPHEN 10 MG/ML IV SOLN
INTRAVENOUS | Status: DC | PRN
  Administered 2019-06-15: 1000 mg via INTRAVENOUS

## 2019-06-15 MED ORDER — ONDANSETRON HCL 4 MG/2ML IJ SOLN
INTRAMUSCULAR | Status: AC
  Filled 2019-06-15: qty 2

## 2019-06-15 MED ORDER — FENTANYL CITRATE (PF) 250 MCG/5ML IJ SOLN
INTRAMUSCULAR | Status: DC | PRN
  Administered 2019-06-15 (×3): 50 ug via INTRAVENOUS

## 2019-06-15 MED ORDER — ROCURONIUM BROMIDE 10 MG/ML (PF) SYRINGE
PREFILLED_SYRINGE | INTRAVENOUS | Status: AC
  Filled 2019-06-15: qty 10

## 2019-06-15 MED ORDER — HYDROXYZINE HCL 50 MG/ML IM SOLN: 50.0000 mg | INTRAMUSCULAR | Status: DC | PRN

## 2019-06-15 MED ORDER — ONDANSETRON HCL 4 MG PO TABS: 4.0000 mg | ORAL_TABLET | Freq: Four times a day (QID) | ORAL | Status: DC | PRN

## 2019-06-15 MED ORDER — MAGNESIUM OXIDE 400 (241.3 MG) MG PO TABS
400.0000 mg | ORAL_TABLET | Freq: Every day | ORAL | Status: DC
  Administered 2019-06-15: 400 mg via ORAL
  Filled 2019-06-15 (×3): qty 1

## 2019-06-15 MED ORDER — MORPHINE SULFATE (PF) 4 MG/ML IV SOLN: 4.0000 mg | INTRAVENOUS | Status: DC | PRN

## 2019-06-15 MED ORDER — LIDOCAINE 2% (20 MG/ML) 5 ML SYRINGE
INTRAMUSCULAR | Status: DC | PRN
  Administered 2019-06-15: 60 mg via INTRAVENOUS
  Administered 2019-06-15: 40 mg via INTRAVENOUS

## 2019-06-15 MED ORDER — CHLORHEXIDINE GLUCONATE CLOTH 2 % EX PADS: 6.0000 | MEDICATED_PAD | Freq: Once | CUTANEOUS | Status: DC

## 2019-06-15 MED ORDER — MIDAZOLAM HCL 2 MG/2ML IJ SOLN
INTRAMUSCULAR | Status: AC
  Filled 2019-06-15: qty 2

## 2019-06-15 MED ORDER — BISACODYL 10 MG RE SUPP: 10.0000 mg | Freq: Every day | RECTAL | Status: DC | PRN

## 2019-06-15 MED ORDER — SUGAMMADEX SODIUM 200 MG/2ML IV SOLN
INTRAVENOUS | Status: DC | PRN
  Administered 2019-06-15: 200 mg via INTRAVENOUS

## 2019-06-15 MED ORDER — HYDROMORPHONE HCL 1 MG/ML IJ SOLN
INTRAMUSCULAR | Status: AC
  Filled 2019-06-15: qty 1

## 2019-06-15 MED ORDER — DEXAMETHASONE SODIUM PHOSPHATE 10 MG/ML IJ SOLN
INTRAMUSCULAR | Status: DC | PRN
  Administered 2019-06-15: 10 mg via INTRAVENOUS

## 2019-06-15 MED ORDER — HYDROXYZINE HCL 25 MG PO TABS: 50.0000 mg | ORAL_TABLET | ORAL | Status: DC | PRN

## 2019-06-15 MED ORDER — SODIUM CHLORIDE 0.9% FLUSH: 3.0000 mL | INTRAVENOUS | Status: DC | PRN

## 2019-06-15 MED ORDER — 0.9 % SODIUM CHLORIDE (POUR BTL) OPTIME
TOPICAL | Status: DC | PRN
  Administered 2019-06-15: 1000 mL

## 2019-06-15 MED ORDER — ACETAMINOPHEN 650 MG RE SUPP: 650.0000 mg | RECTAL | Status: DC | PRN

## 2019-06-15 MED ORDER — THROMBIN 5000 UNITS EX SOLR
OROMUCOSAL | Status: DC | PRN
  Administered 2019-06-15: 14:00:00 via TOPICAL

## 2019-06-15 MED ORDER — FLEET ENEMA 7-19 GM/118ML RE ENEM: 1.0000 | ENEMA | Freq: Once | RECTAL | Status: DC | PRN

## 2019-06-15 MED ORDER — THROMBIN 5000 UNITS EX SOLR
CUTANEOUS | Status: AC
  Filled 2019-06-15: qty 10000

## 2019-06-15 MED ORDER — ALPRAZOLAM 0.25 MG PO TABS
0.2500 mg | ORAL_TABLET | Freq: Every evening | ORAL | Status: DC | PRN
  Administered 2019-06-15: 0.25 mg via ORAL
  Filled 2019-06-15: qty 1

## 2019-06-15 MED ORDER — CYCLOBENZAPRINE HCL 5 MG PO TABS
5.0000 mg | ORAL_TABLET | Freq: Three times a day (TID) | ORAL | Status: DC | PRN
  Administered 2019-06-15: 10 mg via ORAL
  Filled 2019-06-15: qty 2

## 2019-06-15 MED ORDER — KETOROLAC TROMETHAMINE 30 MG/ML IJ SOLN
30.0000 mg | Freq: Four times a day (QID) | INTRAMUSCULAR | Status: DC
  Administered 2019-06-15 – 2019-06-16 (×2): 30 mg via INTRAVENOUS
  Filled 2019-06-15 (×2): qty 1

## 2019-06-15 MED ORDER — LIDOCAINE 2% (20 MG/ML) 5 ML SYRINGE
INTRAMUSCULAR | Status: AC
  Filled 2019-06-15: qty 5

## 2019-06-15 MED ORDER — ONDANSETRON HCL 4 MG/2ML IJ SOLN
INTRAMUSCULAR | Status: DC | PRN
  Administered 2019-06-15: 4 mg via INTRAVENOUS

## 2019-06-15 MED ORDER — SUCCINYLCHOLINE CHLORIDE 200 MG/10ML IV SOSY
PREFILLED_SYRINGE | INTRAVENOUS | Status: AC
  Filled 2019-06-15: qty 10

## 2019-06-15 MED ORDER — ALUM & MAG HYDROXIDE-SIMETH 200-200-20 MG/5ML PO SUSP: 30.0000 mL | Freq: Four times a day (QID) | ORAL | Status: DC | PRN

## 2019-06-15 MED ORDER — ONDANSETRON HCL 4 MG/2ML IJ SOLN: 4.0000 mg | Freq: Four times a day (QID) | INTRAMUSCULAR | Status: DC | PRN

## 2019-06-15 MED ORDER — PROPOFOL 10 MG/ML IV BOLUS
INTRAVENOUS | Status: AC
  Filled 2019-06-15: qty 40

## 2019-06-15 MED ORDER — ACETAMINOPHEN 325 MG PO TABS: 650.0000 mg | ORAL_TABLET | ORAL | Status: DC | PRN

## 2019-06-15 MED ORDER — SUCCINYLCHOLINE CHLORIDE 20 MG/ML IJ SOLN
INTRAMUSCULAR | Status: DC | PRN
  Administered 2019-06-15: 120 mg via INTRAVENOUS

## 2019-06-15 MED ORDER — MIDAZOLAM HCL 5 MG/5ML IJ SOLN
INTRAMUSCULAR | Status: DC | PRN
  Administered 2019-06-15: 2 mg via INTRAVENOUS

## 2019-06-15 MED ORDER — MAGNESIUM HYDROXIDE 400 MG/5ML PO SUSP: 30.0000 mL | Freq: Every day | ORAL | Status: DC | PRN

## 2019-06-15 MED ORDER — ROCURONIUM BROMIDE 50 MG/5ML IV SOSY
PREFILLED_SYRINGE | INTRAVENOUS | Status: DC | PRN
  Administered 2019-06-15: 50 mg via INTRAVENOUS
  Administered 2019-06-15 (×3): 20 mg via INTRAVENOUS

## 2019-06-15 MED ORDER — KETOROLAC TROMETHAMINE 30 MG/ML IJ SOLN
INTRAMUSCULAR | Status: AC
  Filled 2019-06-15: qty 1

## 2019-06-15 MED ORDER — THROMBIN 20000 UNITS EX SOLR
CUTANEOUS | Status: AC
  Filled 2019-06-15: qty 20000

## 2019-06-15 MED ORDER — MENTHOL 3 MG MT LOZG: 1.0000 | LOZENGE | OROMUCOSAL | Status: DC | PRN

## 2019-06-15 MED ORDER — BUPIVACAINE HCL (PF) 0.5 % IJ SOLN
INTRAMUSCULAR | Status: AC
  Filled 2019-06-15: qty 30

## 2019-06-15 MED ORDER — PHENOL 1.4 % MT LIQD: 1.0000 | OROMUCOSAL | Status: DC | PRN

## 2019-06-15 MED ORDER — LIDOCAINE-EPINEPHRINE 1 %-1:100000 IJ SOLN
INTRAMUSCULAR | Status: DC | PRN
  Administered 2019-06-15: 5 mL

## 2019-06-15 MED ORDER — LIDOCAINE-EPINEPHRINE 1 %-1:100000 IJ SOLN
INTRAMUSCULAR | Status: AC
  Filled 2019-06-15: qty 1

## 2019-06-15 MED ORDER — CEFAZOLIN SODIUM-DEXTROSE 2-4 GM/100ML-% IV SOLN
INTRAVENOUS | Status: AC
  Filled 2019-06-15: qty 100

## 2019-06-15 MED ORDER — KETOROLAC TROMETHAMINE 30 MG/ML IJ SOLN
30.0000 mg | Freq: Once | INTRAMUSCULAR | Status: AC
  Administered 2019-06-15: 30 mg via INTRAVENOUS

## 2019-06-15 MED ORDER — THROMBIN 20000 UNITS EX SOLR
CUTANEOUS | Status: DC | PRN
  Administered 2019-06-15: 14:00:00 via TOPICAL

## 2019-06-15 MED ORDER — HYDROMORPHONE HCL 1 MG/ML IJ SOLN
0.2500 mg | INTRAMUSCULAR | Status: DC | PRN
  Administered 2019-06-15 (×4): 0.5 mg via INTRAVENOUS

## 2019-06-15 MED ORDER — BUPIVACAINE HCL (PF) 0.5 % IJ SOLN
INTRAMUSCULAR | Status: DC | PRN
  Administered 2019-06-15: 5 mL

## 2019-06-15 MED ORDER — SODIUM CHLORIDE 0.9 % IV SOLN
INTRAVENOUS | Status: DC | PRN
  Administered 2019-06-15: 14:00:00

## 2019-06-15 MED ORDER — HYDROCODONE-ACETAMINOPHEN 5-325 MG PO TABS
1.0000 | ORAL_TABLET | ORAL | Status: DC | PRN
  Administered 2019-06-15 – 2019-06-16 (×3): 2 via ORAL
  Filled 2019-06-15 (×3): qty 2

## 2019-06-15 MED ORDER — KCL IN DEXTROSE-NACL 20-5-0.45 MEQ/L-%-% IV SOLN: INTRAVENOUS | Status: DC

## 2019-06-15 MED ORDER — FENTANYL CITRATE (PF) 250 MCG/5ML IJ SOLN
INTRAMUSCULAR | Status: AC
  Filled 2019-06-15: qty 5

## 2019-06-15 MED ORDER — DEXAMETHASONE SODIUM PHOSPHATE 10 MG/ML IJ SOLN
INTRAMUSCULAR | Status: AC
  Filled 2019-06-15: qty 1

## 2019-06-15 MED ORDER — PROPOFOL 10 MG/ML IV BOLUS
INTRAVENOUS | Status: DC | PRN
  Administered 2019-06-15: 150 mg via INTRAVENOUS

## 2019-06-15 MED ORDER — SODIUM CHLORIDE 0.9 % IV SOLN: 250.0000 mL | INTRAVENOUS | Status: DC

## 2019-06-15 MED ORDER — PHENYLEPHRINE HCL-NACL 10-0.9 MG/250ML-% IV SOLN
INTRAVENOUS | Status: DC | PRN
  Administered 2019-06-15: 25 ug/min via INTRAVENOUS

## 2019-06-15 MED ORDER — CEFAZOLIN SODIUM-DEXTROSE 2-4 GM/100ML-% IV SOLN
2.0000 g | INTRAVENOUS | Status: AC
  Administered 2019-06-15: 2 g via INTRAVENOUS

## 2019-06-15 MED ORDER — SODIUM CHLORIDE 0.9% FLUSH
3.0000 mL | Freq: Two times a day (BID) | INTRAVENOUS | Status: DC
  Administered 2019-06-15: 3 mL via INTRAVENOUS

## 2019-06-15 MED ORDER — LACTATED RINGERS IV SOLN
INTRAVENOUS | Status: DC
  Administered 2019-06-15: 09:00:00 via INTRAVENOUS

## 2019-06-15 SURGICAL SUPPLY — 56 items
ALLOGRAFT CA 6X14X11 (Bone Implant) ×2 IMPLANT
BAG DECANTER FOR FLEXI CONT (MISCELLANEOUS) ×2 IMPLANT
BIT DRILL NEURO 2X3.1 SFT TUCH (MISCELLANEOUS) ×1 IMPLANT
BIT DRILL POWER (BIT) ×1 IMPLANT
BLADE ULTRA TIP 2M (BLADE) ×2 IMPLANT
CANISTER SUCT 3000ML PPV (MISCELLANEOUS) ×2 IMPLANT
CARTRIDGE OIL MAESTRO DRILL (MISCELLANEOUS) ×1 IMPLANT
COVER MAYO STAND STRL (DRAPES) ×2 IMPLANT
COVER WAND RF STERILE (DRAPES) IMPLANT
DECANTER SPIKE VIAL GLASS SM (MISCELLANEOUS) ×2 IMPLANT
DERMABOND ADVANCED (GAUZE/BANDAGES/DRESSINGS) ×1
DERMABOND ADVANCED .7 DNX12 (GAUZE/BANDAGES/DRESSINGS) ×1 IMPLANT
DIFFUSER DRILL AIR PNEUMATIC (MISCELLANEOUS) ×2 IMPLANT
DRAPE HALF SHEET 40X57 (DRAPES) IMPLANT
DRAPE LAPAROTOMY 100X72 PEDS (DRAPES) ×2 IMPLANT
DRAPE MICROSCOPE LEICA (MISCELLANEOUS) ×2 IMPLANT
DRAPE POUCH INSTRU U-SHP 10X18 (DRAPES) IMPLANT
DRILL BIT POWER (BIT) ×1
DRILL NEURO 2X3.1 SOFT TOUCH (MISCELLANEOUS) ×2
ELECT COATED BLADE 2.86 ST (ELECTRODE) ×2 IMPLANT
ELECT REM PT RETURN 9FT ADLT (ELECTROSURGICAL) ×2
ELECTRODE REM PT RTRN 9FT ADLT (ELECTROSURGICAL) ×1 IMPLANT
GLOVE BIOGEL PI IND STRL 7.5 (GLOVE) ×1 IMPLANT
GLOVE BIOGEL PI IND STRL 8 (GLOVE) ×5 IMPLANT
GLOVE BIOGEL PI INDICATOR 7.5 (GLOVE) ×1
GLOVE BIOGEL PI INDICATOR 8 (GLOVE) ×5
GLOVE ECLIPSE 7.5 STRL STRAW (GLOVE) ×10 IMPLANT
GLOVE EXAM NITRILE XL STR (GLOVE) IMPLANT
GOWN STRL REUS W/ TWL LRG LVL3 (GOWN DISPOSABLE) IMPLANT
GOWN STRL REUS W/ TWL XL LVL3 (GOWN DISPOSABLE) ×1 IMPLANT
GOWN STRL REUS W/TWL 2XL LVL3 (GOWN DISPOSABLE) ×4 IMPLANT
GOWN STRL REUS W/TWL LRG LVL3 (GOWN DISPOSABLE)
GOWN STRL REUS W/TWL XL LVL3 (GOWN DISPOSABLE) ×1
HALTER HD/CHIN CERV TRACTION D (MISCELLANEOUS) ×2 IMPLANT
HEMOSTAT POWDER KIT SURGIFOAM (HEMOSTASIS) ×2 IMPLANT
KIT BASIN OR (CUSTOM PROCEDURE TRAY) ×2 IMPLANT
KIT TURNOVER KIT B (KITS) ×2 IMPLANT
NEEDLE HYPO 25GX1X1/2 BEV (NEEDLE) ×2 IMPLANT
NEEDLE SPNL 22GX3.5 QUINCKE BK (NEEDLE) ×4 IMPLANT
NS IRRIG 1000ML POUR BTL (IV SOLUTION) ×2 IMPLANT
OIL CARTRIDGE MAESTRO DRILL (MISCELLANEOUS) ×2
PACK LAMINECTOMY NEURO (CUSTOM PROCEDURE TRAY) ×2 IMPLANT
PAD ARMBOARD 7.5X6 YLW CONV (MISCELLANEOUS) ×6 IMPLANT
PENCIL BUTTON HOLSTER BLD 10FT (ELECTRODE) ×2 IMPLANT
PLATE ARCHON-R 32 2LVL (Plate) ×2 IMPLANT
RUBBERBAND STERILE (MISCELLANEOUS) ×4 IMPLANT
SCREW ARCHON SELFTAP 4.0X15MM (Screw) ×12 IMPLANT
SPONGE INTESTINAL PEANUT (DISPOSABLE) ×2 IMPLANT
SPONGE SURGIFOAM ABS GEL 100 (HEMOSTASIS) ×2 IMPLANT
STAPLER SKIN PROX WIDE 3.9 (STAPLE) IMPLANT
SUT VIC AB 2-0 CP2 18 (SUTURE) ×2 IMPLANT
SUT VIC AB 3-0 SH 8-18 (SUTURE) ×2 IMPLANT
SUT VIC AB 4-0 RB1 18 (SUTURE) ×2 IMPLANT
TOWEL GREEN STERILE (TOWEL DISPOSABLE) ×2 IMPLANT
TOWEL GREEN STERILE FF (TOWEL DISPOSABLE) IMPLANT
WATER STERILE IRR 1000ML POUR (IV SOLUTION) ×2 IMPLANT

## 2019-06-15 NOTE — Anesthesia Procedure Notes (Signed)
Procedure Name: Intubation Date/Time: 06/15/2019 12:43 PM Performed by: Glynda Jaeger, CRNA Pre-anesthesia Checklist: Patient identified, Patient being monitored, Timeout performed, Emergency Drugs available and Suction available Patient Re-evaluated:Patient Re-evaluated prior to induction Oxygen Delivery Method: Circle System Utilized Preoxygenation: Pre-oxygenation with 100% oxygen Induction Type: IV induction Ventilation: Mask ventilation without difficulty Laryngoscope Size: Glidescope and 4 Grade View: Grade I Tube type: Oral Tube size: 7.5 mm Number of attempts: 1 Airway Equipment and Method: Video-laryngoscopy Placement Confirmation: ETT inserted through vocal cords under direct vision,  positive ETCO2 and breath sounds checked- equal and bilateral Secured at: 23 cm Tube secured with: Tape Dental Injury: Teeth and Oropharynx as per pre-operative assessment

## 2019-06-15 NOTE — Progress Notes (Signed)
Vitals:   06/15/19 1530 06/15/19 1545 06/15/19 1600 06/15/19 1618  BP: (!) 144/100 (!) 143/99 (!) 138/100 (!) 155/111  Pulse: 92 96 91 98  Resp: 15 13 12 18   Temp: (!) 97 F (36.1 C)  (!) 97.3 F (36.3 C) 98.1 F (36.7 C)  TempSrc:    Oral  SpO2: 97% 93% 91% 97%  Weight:      Height:        CBC Recent Labs    06/13/19 0844  WBC 6.9  HGB 17.8*  HCT 52.9*  PLT 244   BMET Recent Labs    06/13/19 0844  NA 138  K 4.0  CL 103  CO2 27  GLUCOSE 101*  BUN 13  CREATININE 1.38*  CALCIUM 9.1    Patient sitting up on the side of the bed, comfortable.  Already has noted significant recovery of sensation in the medial aspect of the left hand still some numbness in the medial left forearm.  Overall feels that cervical radicular symptoms are significantly improved.  Wound clean and dry; no erythema, ecchymosis, swelling, or drainage.  Patient has been ambulating.  He has voided.  Plan: Encouraged to continue to ambulate.  We will continue to progress through postoperative recovery.  Hosie Spangle, MD 06/15/2019, 6:16 PM

## 2019-06-15 NOTE — H&P (Signed)
Subjective: Patient is a 43 y.o. male who is admitted for treatment of acute central to left C7-T1 cervical disc herniation.  He is 26 months status post a 3 level C4-5, C5-6, and C6-7 ACDF.  7 weeks ago he developed neck pain with pain in the left elbow and at times of left arm and shoulder.  Has pain around the left scapula, as well as in the mid back.  Has numbness in the third, fourth, and fifth digit of the left hand which can extend back into the left forearm.  He has noticed weakness in left hand and arm.  On examination he is found to have atrophy in the intrinsic musculature of the left hand with weakness 4/5 in the left finger abductors.  He has decreased sensation in the left fifth digit, medial left hand, and medial left forearm.  CT showed solid fusion at C4-5, C5-6, C6-7.  MRI scan shows a large central left C7-T1 cervical disc nation with thecal sac, spinal cord, and nerve root compression, corresponding to his radiculopathy.  He is admitted now for a C7-T1 anterior cervical decompression and arthrodesis with structural allograft and cervical plating.  We will remove least the inferior portion of his existing anterior cervical plate, and will remove the entire plate, if feasible.  Patient Active Problem List   Diagnosis Date Noted  . Glenoid labral tear, left, initial encounter 06/15/2018  . Cauda equina syndrome ([place]) 12/02/2017  . Sacral pain 12/02/2017  . Neuropathic pain 12/02/2017  . HNP (herniated nucleus pulposus), cervical 03/23/2017  . Nonallopathic lesion of lumbosacral region 11/18/2016  . Piriformis syndrome of right side 11/03/2016  . Degenerative cervical disc 10/16/2016  . Nonallopathic lesion of cervical region 10/16/2016  . Nonallopathic lesion of thoracic region 10/16/2016  . Nonallopathic lesion of sacral region 10/16/2016  . Leg length discrepancy 10/16/2016  . Lateral epicondylitis of right elbow 10/23/2015  . 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  Blood pressure elevated 08/25/2011  . Anxiety, generalized 04/19/2009  . Abnormal kidney function study 11/29/2007   Past Medical History:  Diagnosis Date  . Cauda equina syndrome ([place])   . Hypertension   . Hypogonadism in male     Past Surgical History:  Procedure Laterality Date  . ankle muscle and skin graft Right 1997  . ANTERIOR CERVICAL DECOMP/DISCECTOMY FUSION N/A 03/23/2017   Procedure: Anterior Cervical Decompresion/Discectomy Fusion - Cervical Four-Cervical five - Cervical five-Cervical six - Cervical six-Cervical seven;  Surgeon: [name], MD;  Location: [place];  Service: Neurosurgery;  Laterality: N/A;  . BACK SURGERY  1997   fusion l4/5,  . ELBOW SURGERY Right 2017   repair tear  . 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 removal of skin and muscle graft Right 1997   infection  . repair bicep tear Right 2015  . SHOULDER SURGERY  2011  . skin grafts Right    right ankle and arm    Medications Prior to Admission  Medication Sig Dispense Refill Last Dose  . ALPRAZolam (XANAX) 0.5 MG tablet Take 0.25 mg by mouth at bedtime as needed for anxiety.    06/13/2019  . ibuprofen (ADVIL,MOTRIN) 200 MG tablet Take 800 mg by mouth every 8 (eight) hours as needed (pain).    06/11/2019  . magnesium oxide (MAG-OX) 400 MG tablet Take 400 mg by mouth daily.   Past Month at Unknown time  . Misc Natural Products (GLUCOSAMINE CHOND COMPLEX/MSM) TABS Take 2 tablets by mouth daily.  Past Month at Unknown time  . Multiple Vitamin (MULTI-VITAMINS) TABS Take 2 tablets by mouth daily. [place] [facility]   Past Month at Unknown time  . testosterone cypionate (DEPOTESTOTERONE CYPIONATE) 100 MG/ML injection Inject 100 mg into the muscle every 14 (fourteen) days. For IM use only   Past Month at Unknown time  . traMADol (ULTRAM) 50 MG tablet Take 50 mg by mouth every 6 (six) hours as needed (pain).   06/14/2019 at Unknown time   No Known Allergies  Social History   Tobacco Use  . Smoking status: Never  Smoker  . Smokeless tobacco: Never Used  Substance Use Topics  . Alcohol use: Yes    Alcohol/week: 0.0 standard drinks    Comment: social    Family History  Problem Relation Age of Onset  . Heart disease Maternal Grandfather   . Diabetes Paternal Grandmother   . Pancreatic cancer Paternal Grandmother      Review of Systems Pertinent items noted in HPI and remainder of comprehensive ROS otherwise negative.  Objective: Vital signs in last 24 hours: Temp:  [98.7 F (37.1 C)] 98.7 F (37.1 C) (11/11 0908) Pulse Rate:  [84] 84 (11/11 0908) Resp:  [20] 20 (11/11 0908) BP: (187)/(132) 187/132 (11/11 0908) SpO2:  [97 %] 97 % (11/11 0908) Weight:  [94.9 kg] 94.9 kg (11/11 0915)  EXAM: Patient well-developed well-nourished white male in no acute distress.   Lungs are clear to auscultation , the patient has symmetrical respiratory excursion. Heart has a regular rate and rhythm normal S1 and S2 no murmur.   Abdomen is soft nontender nondistended bowel sounds are present. Extremity examination shows no clubbing cyanosis or edema. Motor examination shows 5/5 strength in the deltoid, biceps, triceps, and grips bilaterally, as well as the right intrinsics.  However the left intrinsics show evidence of atrophy with loss of bulk in the dorsal interossei and the strength is 4/5 in the finger abductors.  Sensory examination shows decreased pinprick in the left fifth digit, medial left hand, and medial left forearm.  Reflexes show the biceps, brachialis, and triceps are absent.  Left quadriceps 1 right quadriceps 2, gastrocnemius are absent bilaterally.  Toes are downgoing bilaterally.  He has a negative Tinel's at the epicondylar groove bilaterally.  Data Review:CBC    Component Value Date/Time   WBC 6.9 06/13/2019 0844   RBC 5.89 (H) 06/13/2019 0844   HGB 17.8 (H) 06/13/2019 0844   HCT 52.9 (H) 06/13/2019 0844   PLT 244 06/13/2019 0844   MCV 89.8 06/13/2019 0844   MCH 30.2 06/13/2019 0844    MCHC 33.6 06/13/2019 0844   RDW 11.7 06/13/2019 0844                          BMET    Component Value Date/Time   NA 138 06/13/2019 0844   K 4.0 06/13/2019 0844   CL 103 06/13/2019 0844   CO2 27 06/13/2019 0844   GLUCOSE 101 (H) 06/13/2019 0844   BUN 13 06/13/2019 0844   CREATININE 1.38 (H) 06/13/2019 0844   CALCIUM 9.1 06/13/2019 0844   GFRNONAA >60 06/13/2019 0844   GFRAA >60 06/13/2019 0844     Assessment/Plan: Patient with acute left C8 radiculopathy with left intrinsic weakness and sensory deficit with associated interosseous atrophy; MRI scan reveals a large central to left C7-T1 cervical disc herniation.  He has solid fusions C4-5, C5-6, C6-7.  He is admitted now for a C7-T1  anterior cervical decompression and arthrodesis with structural allograft and cervical plating.  I've discussed with the patient the nature of his condition, the nature the surgical procedure, the typical length of surgery, hospital stay, and overall recuperation. We discussed limitations postoperatively. I discussed risks of surgery including risks of infection, bleeding, possibly need for transfusion, the risk of nerve root dysfunction with pain, weakness, numbness, or paresthesias, the risk of spinal cord dysfunction with paralysis of all 4 limbs and quadriplegia, and the risk of dural tear and CSF leakage and possible need for further surgery, the risk of esophageal dysfunction causing dysphagia and the risk of laryngeal dysfunction causing hoarseness of the voice, the risk of failure of the arthrodesis and the possible need for further surgery, and the risk of anesthetic complications including myocardial infarction, stroke, pneumonia, and death. We also discussed the need for postoperative immobilization in a cervical collar. Understanding all this the patient does wish to proceed with surgery and is admitted for such.   Hosie Spangle, MD 06/15/2019 12:24 PM

## 2019-06-15 NOTE — Transfer of Care (Signed)
Immediate Anesthesia Transfer of Care Note  Patient: DURELLE HOLMS  Procedure(s) Performed: Cervical seven Thoracic one Anterior cervical decompression/discectomy/fusion with removal of atlantis cervical plate (N/A Neck)  Patient Location: PACU  Anesthesia Type:General  Level of Consciousness: Awake, Drowsy, responds to commands   Airway & Oxygen Therapy: Patient Spontanous Breathing  Post-op Assessment: Report given to RN and Post -op Vital signs reviewed and stable  Post vital signs: Reviewed and stable  Last Vitals:  Vitals Value Taken Time  BP 155/111 06/15/19 1618  Temp 36.7 C 06/15/19 1618  Pulse 98 06/15/19 1618  Resp 18 06/15/19 1618  SpO2 97 % 06/15/19 1618    Last Pain:  Vitals:   06/15/19 1618  TempSrc: Oral  PainSc:       Patients Stated Pain Goal: 2 ([id number])  Complications: No apparent anesthesia complications

## 2019-06-15 NOTE — Op Note (Signed)
06/15/2019  3:19 PM  PATIENT:  Timothy Powell  43 y.o. male  PRE-OPERATIVE DIAGNOSIS: Central to left C7-T1 cervical disc herniation, left C8 radiculopathy, left hand weakness, status post cervical fusion, cervical spondylosis, cervical degenerative disc disease  POST-OPERATIVE DIAGNOSIS:  Central to left C7-T1 cervical disc herniation, left C8 radiculopathy, left hand weakness, status post cervical fusion, cervical spondylosis, cervical degenerative disc disease  PROCEDURE:  Procedure(s): 1) removal of C4-C7 Atlantis cervical plate; 2) C7-T1 anterior cervical decompression and arthrodesis with structural allograft and Nu[employer] Archon-R anterior cervical plate  SURGEON:  Surgeon(s): [name], MD  ANESTHESIA:   general  EBL:  Total I/O In: 100 [I.V.:100] Out: - EBL: 50 cc  BLOOD ADMINISTERED:none  COUNT: Correct per nursing staff  DICTATION: Patient was brought to the operating room placed under general endotracheal anesthesia. Patient was placed in 10 pounds of halter traction. The neck was prepped with Betadine soap and solution and draped in a sterile fashion. A horizontal incision was made on the left side of the neck. The line of the incision was infiltrated with local anesthetic with epinephrine. Dissection was carried down thru the subcutaneous tissue and platysma, bipolar cautery was used to maintain hemostasis. Dissection was then carried down thru an avascular plane leaving the sternocleidomastoid carotid artery and jugular vein laterally and the trachea and esophagus medially. The ventral aspect of the vertebral column was identified. The existing C4-C7 anterior cervical plate and the QA348G disc space level were identified.  Scar tissue overlying the existing anterior cervical plate was carefully removed.  The 8 screws and locking system were exposed.  All 4 locking pieces were unlocked, and we removed all 8 screws.  Each screw hole was filled with Surgifoam to establish  hemostasis.  The anterior cervical plate was then removed, and the ventral aspect of the vertebral column examined.  Hemostasis was established with bipolar cautery along with Surgifoam.  We then proceeded with the C7-T1 discectomy and arthrodesis.  The C7-T1 annulus was incised and the disc space entered. Discectomy was performed with micro-curettes and pituitary rongeurs. The operating microscope was draped and brought into the field provided additional magnification illumination and visualization. Discectomy was continued posteriorly thru the disc space and then the cartilaginous endplate was removed using micro-curettes along with the high-speed drill. Posterior osteophytic overgrowth was removed using the high-speed drill along with a 2 mm thin footplated Kerrison punch. Posterior longitudinal ligament along with disc herniation was carefully removed, decompressing the spinal canal and thecal sac.  The disc herniation extended from the about the midline laterally into the left C7-T1 neuroforamen.  Using a micro hook we able to remove all of the herniated fragments and decompress the thecal sac and exiting nerve root.  We then continued to remove osteophytic overgrowth and disc material decompressing the neural foramina and exiting nerve roots bilaterally. Once the decompression was completed hemostasis was established with the use of Gelfoam with thrombin. The Gelfoam was removed, a thin layer of Surgifoam was applied, the wound irrigated and hemostasis confirmed. We then measured the height of the intravertebral disc space and selected a 6 millimeter in height structural allograft. It was hydrated and saline solution and then gently positioned in the intravertebral disc space and countersunk. We then selected a 32 millimeter in height Archon-R cervical plate. It was positioned over the fusion construct and secured to the vertebra with three 4 x 15 mm self-tapping screws above and below the interbody graft,  into the adjacent vertebra. Each screw  hole was started with the high-speed drill and then the screws placed once all the screws were placed final tightening was performed, and then the locking system was secured. The wound was irrigated with bacitracin solution checked for hemostasis which was established and confirmed.  The C-arm fluoroscope was used to image the construct.  Visualization was limited by the shoulders, but the overall construct looked good both on C-arm imaging and direct visualization.  We then proceeded with closure. The platysma was closed with interrupted inverted 2-0 undyed Vicryl suture, the subcutaneous and subcuticular closed with interrupted inverted 3-0 and 4-0 undyed Vicryl suture. The skin edges were approximated with Dermabond. Following surgery the patient was taken out of cervical traction. To be reversed and the anesthetic and taken to the recovery room for further care.  PLAN OF CARE: Admit for overnight observation  PATIENT DISPOSITION:  PACU - hemodynamically stable.   Delay start of Pharmacological VTE agent (>24hrs) due to surgical blood loss or risk of bleeding:  yes

## 2019-06-16 ENCOUNTER — Encounter (HOSPITAL_COMMUNITY): Payer: Self-pay | Admitting: Neurosurgery

## 2019-06-16 DIAGNOSIS — M5013 Cervical disc disorder with radiculopathy, cervicothoracic region: Secondary | ICD-10-CM | POA: Diagnosis not present

## 2019-06-16 MED ORDER — HYDROCODONE-ACETAMINOPHEN 5-325 MG PO TABS: 1.0000 | ORAL_TABLET | ORAL | 0 refills | Status: DC | PRN

## 2019-06-16 NOTE — Progress Notes (Signed)
Pt doing well. Pt and family given D/C instructions with verbal understanding. Rx was sent to pharmacy by MD. Pt's incision is clean and dry with no sign of infection. Pt's IV was removed prior to D/C. Pt D/C'd home via walking @ 1050 per MD order. Pt is stable @ D/C and has no other needs at this time. Holli Humbles, RN

## 2019-06-16 NOTE — Progress Notes (Signed)
Orthopedic Tech Progress Note Patient Details:  Timothy Powell 1975-09-28 [id number] Patient has collar Patient ID: Timothy Powell, male   DOB: July 08, 1976, 43 y.o.   MRN: [id number]   Timothy Powell 06/16/2019, 9:11 AM

## 2019-06-16 NOTE — Anesthesia Postprocedure Evaluation (Signed)
Anesthesia Post Note  Patient: Timothy Powell  Procedure(s) Performed: Cervical seven Thoracic one Anterior cervical decompression/discectomy/fusion with removal of atlantis cervical plate (N/A Neck)     Anesthesia Post Evaluation  Last Vitals:  Vitals:   06/16/19 0413 06/16/19 0753  BP: (!) 142/95 (!) 136/96  Pulse: 87 84  Resp: 20 18  Temp: 36.6 C 36.6 C  SpO2: 95% 97%    Last Pain:  Vitals:   06/16/19 0800  TempSrc:   PainSc: 2                  Zoran Yankee

## 2019-06-16 NOTE — Discharge Instructions (Signed)
Wound Care °Leave incision open to air. °You may shower. °Do not scrub directly on incision.  °Do not put any creams, lotions, or ointments on incision. °Activity °Walk each and every day, increasing distance each day. °No lifting greater than 5 lbs.  Avoid excessive neck motion. °No driving for 2 weeks; may ride as a passenger locally. °Wear neck brace at all times except when showering.  If provided soft collar, may wear for comfort unless otherwise instructed. °Diet °Resume your normal diet.  °Return to Work °Will be discussed at you follow up appointment. °Call Your Doctor If Any of These Occur °Redness, drainage, or swelling at the wound.  °Temperature greater than 101 degrees. °Severe pain not relieved by pain medication. °Increased difficulty swallowing. °Incision starts to come apart. °Follow Up Appt °Call today for appointment in 3 weeks ([phone]) or for problems.  If you have any hardware placed in your spine, you will need an x-ray before your appointment. °  ° ° °Anterior Cervical Diskectomy and Fusion, Care After °This sheet gives you information about how to care for yourself after your procedure. Your health care provider may also give you more specific instructions. If you have problems or questions, contact your health care provider. °What can I expect after the procedure? °After the procedure, it is common to have: °· Neck pain. °· Discomfort when swallowing. °· Slight hoarseness. °Follow these instructions at home: °If you have a neck brace: °· Wear it as told by your health care provider. Remove it only as told by your health care provider. °· Keep the brace clean and dry. °· Ask your health care provider if you should remove the brace to bathe or shower. °Incision care ° °· Follow instructions from your health care provider about how to take care of your incision. Make sure you: °? Wash your hands with soap and water before and after you change your bandage (dressing). If soap and water are not  available, use hand sanitizer. °? Change your dressing as told by your health care provider. °? Leave stitches (sutures), skin glue, or adhesive strips in place. These skin closures may need to stay in place for 2 weeks or longer. If adhesive strip edges start to loosen and curl up, you may trim the loose edges. Do not remove adhesive strips completely unless your health care provider tells you to do that. °· Check your incision area every day for signs of infection. Check for: °? Redness, swelling, or pain. °? Fluid or blood. °? Warmth. °? Pus or a bad smell. °Managing pain, stiffness, and swelling ° °· Take over-the-counter and prescription medicines only as told by your health care provider. °· If directed, put ice on the injured area. °? If you have a removable brace, remove it as told by your health care provider. °? Put ice in a plastic bag. °? Place a towel between your skin and the bag. °? Leave the ice on for 20 minutes, 2-3 times a day. °Activity ° °· Return to your normal activities as told by your health care provider. Ask your health care provider what activities are safe for you. °· Do exercises as told by your health care provider. °· Do not take baths, swim, or use a hot tub until your health care provider approves. °· Do not lift anything that is heavier than 10 lb (4.5 kg), or the limit that you are told, until your health care provider says that it is safe. °General instructions °· Ask your   health care provider if the medicine prescribed to you: °? Requires you to avoid driving or using heavy machinery. °? Can cause constipation. You may need to take actions to prevent or treat constipation, such as: °§ Drink enough fluid to keep your urine pale yellow. °§ Take over-the-counter or prescription medicines. °§ Eat foods that are high in fiber, such as beans, whole grains, and fresh fruits and vegetables. °§ Limit foods that are high in fat and processed sugars, such as fried and sweet foods. °· Do  not use any products that contain nicotine or tobacco, such as cigarettes, e-cigarettes, and chewing tobacco. These can delay healing. If you need help quitting, ask your health care provider. °· Keep all follow-up visits and physical therapy appointments as told by your health care provider. This is important. °Contact a health care provider if you have: °· A fever. °· Redness, swelling, or pain around your incision. °· Fluid or blood coming from your incision. °· Pus or a bad smell coming from your incision. °· Pain that is not controlled by your pain medicine. °· Increasing hoarseness or trouble swallowing. °Get help right away if you have: °· Severe pain. °· Sudden numbness or weakness in your arms. °· Warmth, tenderness, or swelling in your calf. °· Chest pain. °· Difficulty breathing. °Summary °· After the procedure, it is common to have neck pain, discomfort when swallowing, and slight hoarseness. °· Follow instructions from your health care provider about how to take care of your incision. °· Check your incision area every day for signs of infection. °· Return to your normal activities as told by your health care provider. Ask your health care provider what activities are safe for you. °· Contact a health care provider if you have signs of infection at your incision. °This information is not intended to replace advice given to you by your health care provider. Make sure you discuss any questions you have with your health care provider. °Document Released: 08/17/2015 Document Revised: 04/15/2018 Document Reviewed: 04/15/2018 °Elsevier Patient Education © 2020 [employer] ° °

## 2019-06-16 NOTE — Discharge Summary (Signed)
Physician Discharge Summary  Patient ID: Timothy Powell MRN: [id number] DOB/AGE: 1975-09-24 43 y.o.  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  Postoperatively he has done well with improvement in his radicular symptoms.  He is up and ambulating actively.  He is comfortable.  We are discharging him to home with instructions regarding wound care and activities.  He is scheduled for follow-up with me in 3 weeks.  Discharge Exam: Blood pressure (!) 136/96, pulse 84, temperature 97.9 F (36.6 C), temperature source Oral, resp. rate 18, height 5\' 10"  (1.778 m), weight 94.9 kg, SpO2 97 %.  Disposition: Discharge disposition: 01-Home or Self Care       Discharge Instructions    Discharge wound care:   Complete by: As directed    Leave the wound open to air. Shower daily with the wound uncovered. Water and soapy water should run over the incision area. Do not wash directly on the incision for 2 weeks. Remove the glue after 2 weeks.   Driving Restrictions   Complete by: As directed    No driving for 2 weeks. May ride in the car locally now. May begin to drive locally in 2 weeks.   Other Restrictions   Complete by: As directed    Walk gradually increasing distances out in the fresh air at least  twice a day. Walking additional 6 times inside the house, gradually increasing distances, daily. No bending, lifting, or twisting. Perform activities between shoulder and waist height (that is at counter height when standing or table height when sitting).     Allergies as of 06/16/2019   No Known Allergies     Medication List    TAKE these medications   ALPRAZolam 0.5 MG tablet Commonly known as: XANAX Take 0.25 mg by mouth at bedtime as needed for anxiety.   Glucosamine Chond Complex/MSM Tabs Take 2 tablets by mouth daily.   HYDROcodone-acetaminophen 5-325 MG tablet Commonly known as: NORCO/VICODIN Take 1-2 tablets by mouth every 4 (four) hours as needed (pain).   ibuprofen 200 MG tablet Commonly known as: ADVIL Take 800 mg by mouth every 8 (eight) hours as needed (pain).   magnesium oxide 400 MG tablet Commonly known as: MAG-OX Take 400 mg by mouth daily.   Multi-Vitamins Tabs Take 2 tablets by mouth daily. [employer] MEGA MEN   testosterone cypionate 100 MG/ML injection Commonly known as: DEPOTESTOTERONE CYPIONATE Inject 100 mg into the muscle every 14 (fourteen) days. For IM use only   traMADol 50 MG tablet Commonly known as: ULTRAM Take 50 mg by mouth every 6 (six) hours as needed (pain).            Discharge Care Instructions  (From admission, onward)         Start     Ordered   06/16/19 0000  Discharge wound care:    Comments: Leave the wound open to air. Shower daily with the wound uncovered. Water  and soapy water should run over the incision area. Do not wash directly on the incision for 2 weeks. Remove the glue after 2 weeks.   06/16/19 0820           Signed: Hosie Spangle 06/16/2019, 8:21 AM

## 2019-07-11 DIAGNOSIS — Z981 Arthrodesis status: Secondary | ICD-10-CM | POA: Diagnosis not present

## 2019-07-11 DIAGNOSIS — M5020 Other cervical disc displacement, unspecified cervical region: Secondary | ICD-10-CM | POA: Diagnosis not present
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
## 2019-08-12 ENCOUNTER — Ambulatory Visit (INDEPENDENT_AMBULATORY_CARE_PROVIDER_SITE_OTHER): Payer: Medicare Other | Admitting: Adult Health

## 2019-08-12 ENCOUNTER — Encounter: Payer: Self-pay | Admitting: Adult Health

## 2019-08-12 VITALS — BP 150/105 | Ht 70.0 in | Wt 200.0 lb

## 2019-08-12 DIAGNOSIS — Z8639 Personal history of other endocrine, nutritional and metabolic disease: Secondary | ICD-10-CM

## 2019-08-12 DIAGNOSIS — I10 Essential (primary) hypertension: Secondary | ICD-10-CM

## 2019-08-12 DIAGNOSIS — E230 Hypopituitarism: Secondary | ICD-10-CM

## 2019-08-12 DIAGNOSIS — Z77011 Contact with and (suspected) exposure to lead: Secondary | ICD-10-CM | POA: Diagnosis not present

## 2019-08-12 DIAGNOSIS — R944 Abnormal results of kidney function studies: Secondary | ICD-10-CM | POA: Diagnosis not present

## 2019-08-12 DIAGNOSIS — M5020 Other cervical disc displacement, unspecified cervical region: Secondary | ICD-10-CM

## 2019-08-12 DIAGNOSIS — M792 Neuralgia and neuritis, unspecified: Secondary | ICD-10-CM | POA: Diagnosis not present

## 2019-08-12 DIAGNOSIS — E291 Testicular hypofunction: Secondary | ICD-10-CM | POA: Diagnosis not present

## 2019-08-12 DIAGNOSIS — G5701 Lesion of sciatic nerve, right lower limb: Secondary | ICD-10-CM | POA: Diagnosis not present

## 2019-08-12 DIAGNOSIS — Z79890 Hormone replacement therapy (postmenopausal): Secondary | ICD-10-CM | POA: Diagnosis not present

## 2019-08-12 DIAGNOSIS — Z0000 Encounter for general adult medical examination without abnormal findings: Secondary | ICD-10-CM

## 2019-08-12 DIAGNOSIS — E559 Vitamin D deficiency, unspecified: Secondary | ICD-10-CM

## 2019-08-12 DIAGNOSIS — M5030 Other cervical disc degeneration, unspecified cervical region: Secondary | ICD-10-CM

## 2019-08-12 NOTE — Patient Instructions (Signed)
Preventing Cerebrovascular Disease  Arteries are blood vessels that carry blood that contains oxygen from the heart to all parts of the body. Cerebrovascular disease affects arteries that supply the brain. Any condition that blocks or disrupts blood flow to the brain can cause cerebrovascular disease. Brain cells that lose blood supply start to die within minutes (stroke). Stroke is the main danger of cerebrovascular disease. Atherosclerosis and high blood pressure are common causes of cerebrovascular disease. Atherosclerosis is narrowing and hardening of an artery that results when fat, cholesterol, calcium, or other substances (plaque) build up inside an artery. Plaque reduces blood flow through the artery. High blood pressure increases the risk of bleeding inside the brain. Making diet and lifestyle changes to prevent atherosclerosis and high blood pressure lowers your risk of cerebrovascular disease. What nutrition changes can be made?  Eat more fruits, vegetables, and whole grains.  Reduce how much saturated fat you eat. To do this, eat less red meat and fewer full-fat dairy products.  Eat healthy proteins instead of red meat. Healthy proteins include: ? Fish. Eat fish that contains heart-healthy omega-3 fatty acids, twice a week. Examples include salmon, albacore tuna, mackerel, and herring. ? Chicken. ? Nuts. ? Low-fat or nonfat yogurt.  Avoid processed meats, like bacon and lunchmeat.  Avoid foods that contain: ? A lot of sugar, such as sweets and drinks with added sugar. ? A lot of salt (sodium). Avoid adding extra salt to your food, as told by your health care provider. ? Trans fats, such as margarine and baked goods. Trans fats may be listed as "partially hydrogenated oils" on food labels.  Check food labels to see how much sodium, sugar, and trans fats are in foods.  Use vegetable oils that contain low amounts of saturated fat, such as olive oil or canola oil. What lifestyle  changes can be made?  Drink alcohol in moderation. This means no more than 1 drink a day for nonpregnant women and 2 drinks a day for men. One drink equals 12 oz of beer, 5 oz of wine, or 1 oz of hard liquor.  If you are overweight, ask your health care provider to recommend a weight-loss plan for you. Losing 5-10 lb (2.2-4.5 kg) can reduce your risk of diabetes, atherosclerosis, and high blood pressure.  Exercise for 30?60 minutes on most days, or as much as told by your health care provider. ? Do moderate-intensity exercise, such as brisk walking, bicycling, and water aerobics. Ask your health care provider which activities are safe for you.   Do not use any products that contain nicotine or tobacco, such as cigarettes and e-cigarettes. If you need help quitting, ask your health care provider. Why are these changes important? Making these changes lowers your risk of many diseases that can cause cerebrovascular disease and stroke. Stroke is a leading cause of death and disability. Making these changes also improves your overall health and quality of life. What can I do to lower my risk? The following factors make you more likely to develop cerebrovascular disease:  Being overweight.  Smoking.  Being physically inactive.  Eating a high-fat diet.  Having certain health conditions, such as: ? Diabetes. ? High blood pressure. ? Heart disease. ? Atherosclerosis. ? High cholesterol. ? Sickle cell disease.  Talk with your health care provider about your risk for cerebrovascular disease. Work with your health care provider to control diseases that you have that may contribute to cerebrovascular disease. Your health care provider may prescribe medicines to  help prevent major causes of cerebrovascular disease. Where to find more information Learn more about preventing cerebrovascular disease from:  [place], Lung, and [place]:  MoAnalyst.de  Centers for Disease Control and Prevention: http://www.curry-wood.biz/ Summary  Cerebrovascular disease can lead to a stroke.  Atherosclerosis and high blood pressure are major causes of cerebrovascular disease.  Making diet and lifestyle changes can reduce your risk of cerebrovascular disease.  Work with your health care provider to get your risk factors under control to reduce your risk of cerebrovascular disease. This information is not intended to replace advice given to you by your health care provider. Make sure you discuss any questions you have with your health care provider. Document Revised: 07/03/2017 Document Reviewed: 08/05/2015 Elsevier Patient Education  2020 [place] DASH stands for "Dietary Approaches to Stop Hypertension." The DASH eating plan is a healthy eating plan that has been shown to reduce high blood pressure (hypertension). It may also reduce your risk for type 2 diabetes, heart disease, and stroke. The DASH eating plan may also help with weight loss. What are tips for following this plan?  General guidelines  Avoid eating more than 2,300 mg (milligrams) of salt (sodium) a day. If you have hypertension, you may need to reduce your sodium intake to 1,500 mg a day.  Limit alcohol intake to no more than 1 drink a day for nonpregnant women and 2 drinks a day for men. One drink equals 12 oz of beer, 5 oz of wine, or 1 oz of hard liquor.  Work with your health care provider to maintain a healthy body weight or to lose weight. Ask what an ideal weight is for you.  Get at least 30 minutes of exercise that causes your heart to beat faster (aerobic exercise) most days of the week. Activities may include walking, swimming, or biking.  Work with your health care provider or diet and nutrition specialist (dietitian) to adjust your eating plan to your individual calorie needs. Reading food  labels   Check food labels for the amount of sodium per serving. Choose foods with less than 5 percent of the Daily Value of sodium. Generally, foods with less than 300 mg of sodium per serving fit into this eating plan.  To find whole grains, look for the word "whole" as the first word in the ingredient list. Shopping  Buy products labeled as "low-sodium" or "no salt added."  Buy fresh foods. Avoid canned foods and premade or frozen meals. Cooking  Avoid adding salt when cooking. Use salt-free seasonings or herbs instead of table salt or sea salt. Check with your health care provider or pharmacist before using salt substitutes.  Do not fry foods. Cook foods using healthy methods such as baking, boiling, grilling, and broiling instead.  Cook with heart-healthy oils, such as olive, canola, soybean, or sunflower oil. Meal planning  Eat a balanced diet that includes: ? 5 or more servings of fruits and vegetables each day. At each meal, try to fill half of your plate with fruits and vegetables. ? Up to 6-8 servings of whole grains each day. ? Less than 6 oz of lean meat, poultry, or fish each day. A 3-oz serving of meat is about the same size as a deck of cards. One egg equals 1 oz. ? 2 servings of low-fat dairy each day. ? A serving of nuts, seeds, or beans 5 times each week. ? Heart-healthy fats. Healthy fats called Omega-3 fatty acids are found in  foods such as flaxseeds and coldwater fish, like sardines, salmon, and mackerel.  Limit how much you eat of the following: ? Canned or prepackaged foods. ? Food that is high in trans fat, such as fried foods. ? Food that is high in saturated fat, such as fatty meat. ? Sweets, desserts, sugary drinks, and other foods with added sugar. ? Full-fat dairy products.  Do not salt foods before eating.  Try to eat at least 2 vegetarian meals each week.  Eat more home-cooked food and less restaurant, buffet, and fast food.  When eating at a  restaurant, ask that your food be prepared with less salt or no salt, if possible. What foods are recommended? The items listed may not be a complete list. Talk with your dietitian about what dietary choices are best for you. Grains Whole-grain or whole-wheat bread. Whole-grain or whole-wheat pasta. Brown rice. [place]. Bulgur. Whole-grain and low-sodium cereals. Pita bread. Low-fat, low-sodium crackers. Whole-wheat flour tortillas. Vegetables Fresh or frozen vegetables (raw, steamed, roasted, or grilled). Low-sodium or reduced-sodium tomato and vegetable juice. Low-sodium or reduced-sodium tomato sauce and tomato paste. Low-sodium or reduced-sodium canned vegetables. Fruits All fresh, dried, or frozen fruit. Canned fruit in natural juice (without added sugar). Meat and other protein foods Skinless chicken or [place]. Ground chicken or [place]. Pork with fat trimmed off. Fish and seafood. Egg whites. Dried beans, peas, or lentils. Unsalted nuts, nut butters, and seeds. Unsalted canned beans. Lean cuts of beef with fat trimmed off. Low-sodium, lean deli meat. Dairy Low-fat (1%) or fat-free (skim) milk. Fat-free, low-fat, or reduced-fat cheeses. Nonfat, low-sodium ricotta or cottage cheese. Low-fat or nonfat yogurt. Low-fat, low-sodium cheese. Fats and oils Soft margarine without trans fats. Vegetable oil. Low-fat, reduced-fat, or light mayonnaise and salad dressings (reduced-sodium). Canola, safflower, olive, soybean, and sunflower oils. Avocado. Seasoning and other foods Herbs. Spices. Seasoning mixes without salt. Unsalted popcorn and pretzels. Fat-free sweets. What foods are not recommended? The items listed may not be a complete list. Talk with your dietitian about what dietary choices are best for you. Grains Baked goods made with fat, such as croissants, muffins, or some breads. Dry pasta or rice meal packs. Vegetables Creamed or fried vegetables. Vegetables in a cheese sauce.  Regular canned vegetables (not low-sodium or reduced-sodium). Regular canned tomato sauce and paste (not low-sodium or reduced-sodium). Regular tomato and vegetable juice (not low-sodium or reduced-sodium). Angie Fava. Olives. Fruits Canned fruit in a light or heavy syrup. Fried fruit. Fruit in cream or butter sauce. Meat and other protein foods Fatty cuts of meat. Ribs. Fried meat. Berniece Salines. Sausage. Bologna and other processed lunch meats. Salami. Fatback. Hotdogs. Bratwurst. Salted nuts and seeds. Canned beans with added salt. Canned or smoked fish. Whole eggs or egg yolks. Chicken or [place] with skin. Dairy Whole or 2% milk, cream, and half-and-half. Whole or full-fat cream cheese. Whole-fat or sweetened yogurt. Full-fat cheese. Nondairy creamers. Whipped toppings. Processed cheese and cheese spreads. Fats and oils Butter. Stick margarine. Lard. Shortening. Ghee. Bacon fat. Tropical oils, such as coconut, palm kernel, or palm oil. Seasoning and other foods Salted popcorn and pretzels. Onion salt, garlic salt, seasoned salt, table salt, and sea salt. Worcestershire sauce. Tartar sauce. Barbecue sauce. Teriyaki sauce. Soy sauce, including reduced-sodium. Steak sauce. Canned and packaged gravies. Fish sauce. Oyster sauce. Cocktail sauce. Horseradish that you find on the shelf. Ketchup. Mustard. Meat flavorings and tenderizers. Bouillon cubes. Hot sauce and Tabasco sauce. Premade or packaged marinades. Premade or packaged taco seasonings. Relishes. Regular  salad dressings. Where to find more information:  National Heart, Lung, and [place]: https://wilson-eaton.com/  American Heart Association: www.heart.org Summary  The DASH eating plan is a healthy eating plan that has been shown to reduce high blood pressure (hypertension). It may also reduce your risk for type 2 diabetes, heart disease, and stroke.  With the DASH eating plan, you should limit salt (sodium) intake to 2,300 mg a day. If you have  hypertension, you may need to reduce your sodium intake to 1,500 mg a day.  When on the DASH eating plan, aim to eat more fresh fruits and vegetables, whole grains, lean proteins, low-fat dairy, and heart-healthy fats.  Work with your health care provider or diet and nutrition specialist (dietitian) to adjust your eating plan to your individual calorie needs. This information is not intended to replace advice given to you by your health care provider. Make sure you discuss any questions you have with your health care provider. Document Revised: 07/03/2017 Document Reviewed: 07/14/2016 Elsevier Patient Education  2020 [employer]. Hypertension, Adult Hypertension is another name for high blood pressure. High blood pressure forces your heart to work harder to pump blood. This can cause problems over time. There are two numbers in a blood pressure reading. There is a top number (systolic) over a bottom number (diastolic). It is best to have a blood pressure that is below 120/80. Healthy choices can help lower your blood pressure, or you may need medicine to help lower it. What are the causes? The cause of this condition is not known. Some conditions may be related to high blood pressure. What increases the risk?  Smoking.  Having type 2 diabetes mellitus, high cholesterol, or both.  Not getting enough exercise or physical activity.  Being overweight.  Having too much fat, sugar, calories, or salt (sodium) in your diet.  Drinking too much alcohol.  Having long-term (chronic) kidney disease.  Having a family history of high blood pressure.  Age. Risk increases with age.  Race. You may be at higher risk if you are African American.  Gender. Men are at higher risk than women before age 66. After age 56, women are at higher risk than men.  Having obstructive sleep apnea.  Stress. What are the signs or symptoms?  High blood pressure may not cause symptoms. Very high blood pressure  (hypertensive crisis) may cause: ? Headache. ? Feelings of worry or nervousness (anxiety). ? Shortness of breath. ? Nosebleed. ? A feeling of being sick to your stomach (nausea). ? Throwing up (vomiting). ? Changes in how you see. ? Very bad chest pain. ? Seizures. How is this treated?  This condition is treated by making healthy lifestyle changes, such as: ? Eating healthy foods. ? Exercising more. ? Drinking less alcohol.  Your health care provider may prescribe medicine if lifestyle changes are not enough to get your blood pressure under control, and if: ? Your top number is above 130. ? Your bottom number is above 80.  Your personal target blood pressure may vary. Follow these instructions at home: Eating and drinking   If told, follow the DASH eating plan. To follow this plan: ? Fill one half of your plate at each meal with fruits and vegetables. ? Fill one fourth of your plate at each meal with whole grains. Whole grains include whole-wheat pasta, brown rice, and whole-grain bread. ? Eat or drink low-fat dairy products, such as skim milk or low-fat yogurt. ? Fill one fourth of your  plate at each meal with low-fat (lean) proteins. Low-fat proteins include fish, chicken without skin, eggs, beans, and tofu. ? Avoid fatty meat, cured and processed meat, or chicken with skin. ? Avoid pre-made or processed food.  Eat less than 1,500 mg of salt each day.  Do not drink alcohol if: ? Your doctor tells you not to drink. ? You are pregnant, may be pregnant, or are planning to become pregnant.  If you drink alcohol: ? Limit how much you use to:  0-1 drink a day for women.  0-2 drinks a day for men. ? Be aware of how much alcohol is in your drink. In the [place] one drink equals one 12 oz bottle of beer (355 mL), one 5 oz glass of wine (148 mL), or one 1 oz glass of hard liquor (44 mL). Lifestyle   Work with your doctor to stay at a healthy weight or to lose weight. Ask your  doctor what the best weight is for you.  Get at least 30 minutes of exercise most days of the week. This may include walking, swimming, or biking.  Get at least 30 minutes of exercise that strengthens your muscles (resistance exercise) at least 3 days a week. This may include lifting weights or doing Pilates.  Do not use any products that contain nicotine or tobacco, such as cigarettes, e-cigarettes, and chewing tobacco. If you need help quitting, ask your doctor.  Check your blood pressure at home as told by your doctor.  Keep all follow-up visits as told by your doctor. This is important. Medicines  Take over-the-counter and prescription medicines only as told by your doctor. Follow directions carefully.  Do not skip doses of blood pressure medicine. The medicine does not work as well if you skip doses. Skipping doses also puts you at risk for problems.  Ask your doctor about side effects or reactions to medicines that you should watch for. Contact a doctor if you:  Think you are having a reaction to the medicine you are taking.  Have headaches that keep coming back (recurring).  Feel dizzy.  Have swelling in your ankles.  Have trouble with your vision. Get help right away if you:  Get a very bad headache.  Start to feel mixed up (confused).  Feel weak or numb.  Feel faint.  Have very bad pain in your: ? Chest. ? Belly (abdomen).  Throw up more than once.  Have trouble breathing. Summary  Hypertension is another name for high blood pressure.  High blood pressure forces your heart to work harder to pump blood.  For most people, a normal blood pressure is less than 120/80.  Making healthy choices can help lower blood pressure. If your blood pressure does not get lower with healthy choices, you may need to take medicine. This information is not intended to replace advice given to you by your health care provider. Make sure you discuss any questions you have with  your health care provider. Document Revised: 03/31/2018 Document Reviewed: 03/31/2018 Elsevier Patient Education  2020 [place] Your Hypertension Hypertension is commonly called high blood pressure. This is when the force of your blood pressing against the walls of your arteries is too strong. Arteries are blood vessels that carry blood from your heart throughout your body. Hypertension forces the heart to work harder to pump blood, and may cause the arteries to become narrow or stiff. Having untreated or uncontrolled hypertension can cause heart attack, stroke, kidney disease, and other  problems. What are blood pressure readings? A blood pressure reading consists of a higher number over a lower number. Ideally, your blood pressure should be below 120/80. The first ("top") number is called the systolic pressure. It is a measure of the pressure in your arteries as your heart beats. The second ("bottom") number is called the diastolic pressure. It is a measure of the pressure in your arteries as the heart relaxes. What does my blood pressure reading mean? Blood pressure is classified into four stages. Based on your blood pressure reading, your health care provider may use the following stages to determine what type of treatment you need, if any. Systolic pressure and diastolic pressure are measured in a unit called mm Hg. Normal  Systolic pressure: below 123456.  Diastolic pressure: below 80. Elevated  Systolic pressure: [id number].  Diastolic pressure: below 80. Hypertension stage 1  Systolic pressure: 0000000.  Diastolic pressure: [id number]. Hypertension stage 2  Systolic pressure: [id number] or above.  Diastolic pressure: 90 or above. What health risks are associated with hypertension? Managing your hypertension is an important responsibility. Uncontrolled hypertension can lead to:  A heart attack.  A stroke.  A weakened blood vessel (aneurysm).  Heart failure.  Kidney  damage.  Eye damage.  Metabolic syndrome.  Memory and concentration problems. What changes can I make to manage my hypertension? Hypertension can be managed by making lifestyle changes and possibly by taking medicines. Your health care provider will help you make a plan to bring your blood pressure within a normal range. Eating and drinking   Eat a diet that is high in fiber and potassium, and low in salt (sodium), added sugar, and fat. An example eating plan is called the DASH (Dietary Approaches to Stop Hypertension) diet. To eat this way: ? Eat plenty of fresh fruits and vegetables. Try to fill half of your plate at each meal with fruits and vegetables. ? Eat whole grains, such as whole wheat pasta, brown rice, or whole grain bread. Fill about one quarter of your plate with whole grains. ? Eat low-fat diary products. ? Avoid fatty cuts of meat, processed or cured meats, and poultry with skin. Fill about one quarter of your plate with lean proteins such as fish, chicken without skin, beans, eggs, and tofu. ? Avoid premade and processed foods. These tend to be higher in sodium, added sugar, and fat.  Reduce your daily sodium intake. Most people with hypertension should eat less than 1,500 mg of sodium a day.  Limit alcohol intake to no more than 1 drink a day for nonpregnant women and 2 drinks a day for men. One drink equals 12 oz of beer, 5 oz of wine, or 1 oz of hard liquor. Lifestyle  Work with your health care provider to maintain a healthy body weight, or to lose weight. Ask what an ideal weight is for you.  Get at least 30 minutes of exercise that causes your heart to beat faster (aerobic exercise) most days of the week. Activities may include walking, swimming, or biking.  Include exercise to strengthen your muscles (resistance exercise), such as weight lifting, as part of your weekly exercise routine. Try to do these types of exercises for 30 minutes at least 3 days a  week.  Do not use any products that contain nicotine or tobacco, such as cigarettes and e-cigarettes. If you need help quitting, ask your health care provider.  Control any long-term (chronic) conditions you have, such as high cholesterol or diabetes. Monitoring  Monitor your blood pressure at home as told by your health care provider. Your personal target blood pressure may vary depending on your medical conditions, your age, and other factors.  Have your blood pressure checked regularly, as often as told by your health care provider. Working with your health care provider  Review all the medicines you take with your health care provider because there may be side effects or interactions.  Talk with your health care provider about your diet, exercise habits, and other lifestyle factors that may be contributing to hypertension.  Visit your health care provider regularly. Your health care provider can help you create and adjust your plan for managing hypertension. Will I need medicine to control my blood pressure? Your health care provider may prescribe medicine if lifestyle changes are not enough to get your blood pressure under control, and if:  Your systolic blood pressure is 130 or higher.  Your diastolic blood pressure is 80 or higher. Take medicines only as told by your health care provider. Follow the directions carefully. Blood pressure medicines must be taken as prescribed. The medicine does not work as well when you skip doses. Skipping doses also puts you at risk for problems. Contact a health care provider if:  You think you are having a reaction to medicines you have taken.  You have repeated (recurrent) headaches.  You feel dizzy.  You have swelling in your ankles.  You have trouble with your vision. Get help right away if:  You develop a severe headache or confusion.  You have unusual weakness or numbness, or you feel faint.  You have severe pain in your chest or  abdomen.  You vomit repeatedly.  You have trouble breathing. Summary  Hypertension is when the force of blood pumping through your arteries is too strong. If this condition is not controlled, it may put you at risk for serious complications.  Your personal target blood pressure may vary depending on your medical conditions, your age, and other factors. For most people, a normal blood pressure is less than 120/80.  Hypertension is managed by lifestyle changes, medicines, or both. Lifestyle changes include weight loss, eating a healthy, low-sodium diet, exercising more, and limiting alcohol. This information is not intended to replace advice given to you by your health care provider. Make sure you discuss any questions you have with your health care provider. Document Revised: 11/12/2018 Document Reviewed: 06/18/2016 Elsevier Patient Education  [place]. Hypertension, Adult Hypertension is another name for high blood pressure. High blood pressure forces your heart to work harder to pump blood. This can cause problems over time. There are two numbers in a blood pressure reading. There is a top number (systolic) over a bottom number (diastolic). It is best to have a blood pressure that is below 120/80. Healthy choices can help lower your blood pressure, or you may need medicine to help lower it. What are the causes? The cause of this condition is not known. Some conditions may be related to high blood pressure. What increases the risk?  Smoking.  Having type 2 diabetes mellitus, high cholesterol, or both.  Not getting enough exercise or physical activity.  Being overweight.  Having too much fat, sugar, calories, or salt (sodium) in your diet.  Drinking too much alcohol.  Having long-term (chronic) kidney disease.  Having a family history of high blood pressure.  Age. Risk increases with age.  Race. You may be at higher risk if you are African American.  Gender.  Men are  at higher risk than women before age 71. After age 73, women are at higher risk than men.  Having obstructive sleep apnea.  Stress. What are the signs or symptoms?  High blood pressure may not cause symptoms. Very high blood pressure (hypertensive crisis) may cause: ? Headache. ? Feelings of worry or nervousness (anxiety). ? Shortness of breath. ? Nosebleed. ? A feeling of being sick to your stomach (nausea). ? Throwing up (vomiting). ? Changes in how you see. ? Very bad chest pain. ? Seizures. How is this treated?  This condition is treated by making healthy lifestyle changes, such as: ? Eating healthy foods. ? Exercising more. ? Drinking less alcohol.  Your health care provider may prescribe medicine if lifestyle changes are not enough to get your blood pressure under control, and if: ? Your top number is above 130. ? Your bottom number is above 80.  Your personal target blood pressure may vary. Follow these instructions at home: Eating and drinking   If told, follow the DASH eating plan. To follow this plan: ? Fill one half of your plate at each meal with fruits and vegetables. ? Fill one fourth of your plate at each meal with whole grains. Whole grains include whole-wheat pasta, brown rice, and whole-grain bread. ? Eat or drink low-fat dairy products, such as skim milk or low-fat yogurt. ? Fill one fourth of your plate at each meal with low-fat (lean) proteins. Low-fat proteins include fish, chicken without skin, eggs, beans, and tofu. ? Avoid fatty meat, cured and processed meat, or chicken with skin. ? Avoid pre-made or processed food.  Eat less than 1,500 mg of salt each day.  Do not drink alcohol if: ? Your doctor tells you not to drink. ? You are pregnant, may be pregnant, or are planning to become pregnant.  If you drink alcohol: ? Limit how much you use to:  0-1 drink a day for women.  0-2 drinks a day for men. ? Be aware of how much alcohol is in your  drink. In the [place] one drink equals one 12 oz bottle of beer (355 mL), one 5 oz glass of wine (148 mL), or one 1 oz glass of hard liquor (44 mL). Lifestyle   Work with your doctor to stay at a healthy weight or to lose weight. Ask your doctor what the best weight is for you.  Get at least 30 minutes of exercise most days of the week. This may include walking, swimming, or biking.  Get at least 30 minutes of exercise that strengthens your muscles (resistance exercise) at least 3 days a week. This may include lifting weights or doing Pilates.  Do not use any products that contain nicotine or tobacco, such as cigarettes, e-cigarettes, and chewing tobacco. If you need help quitting, ask your doctor.  Check your blood pressure at home as told by your doctor.  Keep all follow-up visits as told by your doctor. This is important. Medicines  Take over-the-counter and prescription medicines only as told by your doctor. Follow directions carefully.  Do not skip doses of blood pressure medicine. The medicine does not work as well if you skip doses. Skipping doses also puts you at risk for problems.  Ask your doctor about side effects or reactions to medicines that you should watch for. Contact a doctor if you:  Think you are having a reaction to the medicine you are taking.  Have headaches that keep coming back (recurring).  Feel dizzy.  Have swelling in your ankles.  Have trouble with your vision. Get help right away if you:  Get a very bad headache.  Start to feel mixed up (confused).  Feel weak or numb.  Feel faint.  Have very bad pain in your: ? Chest. ? Belly (abdomen).  Throw up more than once.  Have trouble breathing. Summary  Hypertension is another name for high blood pressure.  High blood pressure forces your heart to work harder to pump blood.  For most people, a normal blood pressure is less than 120/80.  Making healthy choices can help lower blood  pressure. If your blood pressure does not get lower with healthy choices, you may need to take medicine. This information is not intended to replace advice given to you by your health care provider. Make sure you discuss any questions you have with your health care provider. Document Revised: 03/31/2018 Document Reviewed: 03/31/2018 Elsevier Patient Education  2020 [place] Maintenance, Male Adopting a healthy lifestyle and getting preventive care are important in promoting health and wellness. Ask your health care provider about:  The right schedule for you to have regular tests and exams.  Things you can do on your own to prevent diseases and keep yourself healthy. What should I know about diet, weight, and exercise? Eat a healthy diet   Eat a diet that includes plenty of vegetables, fruits, low-fat dairy products, and lean protein.  Do not eat a lot of foods that are high in solid fats, added sugars, or sodium. Maintain a healthy weight Body mass index (BMI) is a measurement that can be used to identify possible weight problems. It estimates body fat based on height and weight. Your health care provider can help determine your BMI and help you achieve or maintain a healthy weight. Get regular exercise Get regular exercise. This is one of the most important things you can do for your health. Most adults should:  Exercise for at least 150 minutes each week. The exercise should increase your heart rate and make you sweat (moderate-intensity exercise).  Do strengthening exercises at least twice a week. This is in addition to the moderate-intensity exercise.  Spend less time sitting. Even light physical activity can be beneficial. Watch cholesterol and blood lipids Have your blood tested for lipids and cholesterol at 44 years of age, then have this test every 5 years. You may need to have your cholesterol levels checked more often if:  Your lipid or cholesterol levels are  high.  You are older than 45 years of age.  You are at high risk for heart disease. What should I know about cancer screening? Many types of cancers can be detected early and may often be prevented. Depending on your health history and family history, you may need to have cancer screening at various ages. This may include screening for:  Colorectal cancer.  Prostate cancer.  Skin cancer.  Lung cancer. What should I know about heart disease, diabetes, and high blood pressure? Blood pressure and heart disease  High blood pressure causes heart disease and increases the risk of stroke. This is more likely to develop in people who have high blood pressure readings, are of African descent, or are overweight.  Talk with your health care provider about your target blood pressure readings.  Have your blood pressure checked: ? Every 3-5 years if you are 81-4 years of age. ? Every year if you are 26 years old or older.  If  you are between the ages of 3 and 8 and are a current or former smoker, ask your health care provider if you should have a one-time screening for abdominal aortic aneurysm (AAA). Diabetes Have regular diabetes screenings. This checks your fasting blood sugar level. Have the screening done:  Once every three years after age 38 if you are at a normal weight and have a low risk for diabetes.  More often and at a younger age if you are overweight or have a high risk for diabetes. What should I know about preventing infection? Hepatitis B If you have a higher risk for hepatitis B, you should be screened for this virus. Talk with your health care provider to find out if you are at risk for hepatitis B infection. Hepatitis C Blood testing is recommended for:  Everyone born from [birth year] through 1966.  Anyone with known risk factors for hepatitis C. Sexually transmitted infections (STIs)  You should be screened each year for STIs, including gonorrhea and chlamydia,  if: ? You are sexually active and are younger than 45 years of age. ? You are older than 45 years of age and your health care provider tells you that you are at risk for this type of infection. ? Your sexual activity has changed since you were last screened, and you are at increased risk for chlamydia or gonorrhea. Ask your health care provider if you are at risk.  Ask your health care provider about whether you are at high risk for HIV. Your health care provider may recommend a prescription medicine to help prevent HIV infection. If you choose to take medicine to prevent HIV, you should first get tested for HIV. You should then be tested every 3 months for as long as you are taking the medicine. Follow these instructions at home: Lifestyle  Do not use any products that contain nicotine or tobacco, such as cigarettes, e-cigarettes, and chewing tobacco. If you need help quitting, ask your health care provider.  Do not use street drugs.  Do not share needles.  Ask your health care provider for help if you need support or information about quitting drugs. Alcohol use  Do not drink alcohol if your health care provider tells you not to drink.  If you drink alcohol: ? Limit how much you have to 0-2 drinks a day. ? Be aware of how much alcohol is in your drink. In the [place] one drink equals one 12 oz bottle of beer (355 mL), one 5 oz glass of wine (148 mL), or one 1 oz glass of hard liquor (44 mL). General instructions  Schedule regular health, dental, and eye exams.  Stay current with your vaccines.  Tell your health care provider if: ? You often feel depressed. ? You have ever been abused or do not feel safe at home. Summary  Adopting a healthy lifestyle and getting preventive care are important in promoting health and wellness.  Follow your health care provider's instructions about healthy diet, exercising, and getting tested or screened for diseases.  Follow your health care  provider's instructions on monitoring your cholesterol and blood pressure. This information is not intended to replace advice given to you by your health care provider. Make sure you discuss any questions you have with your health care provider. Document Revised: 07/14/2018 Document Reviewed: 07/14/2018 Elsevier Patient Education  2020 [employer].

## 2019-08-23 DIAGNOSIS — Z79890 Hormone replacement therapy (postmenopausal): Secondary | ICD-10-CM | POA: Diagnosis not present

## 2019-08-23 DIAGNOSIS — Z0000 Encounter for general adult medical examination without abnormal findings: Secondary | ICD-10-CM | POA: Diagnosis not present

## 2019-08-23 DIAGNOSIS — I10 Essential (primary) hypertension: Secondary | ICD-10-CM | POA: Diagnosis not present

## 2019-08-23 DIAGNOSIS — Z77011 Contact with and (suspected) exposure to lead: Secondary | ICD-10-CM | POA: Diagnosis not present

## 2019-08-23 DIAGNOSIS — E559 Vitamin D deficiency, unspecified: Secondary | ICD-10-CM | POA: Diagnosis not present

## 2019-08-23 DIAGNOSIS — M792 Neuralgia and neuritis, unspecified: Secondary | ICD-10-CM | POA: Diagnosis not present

## 2019-08-23 DIAGNOSIS — E230 Hypopituitarism: Secondary | ICD-10-CM | POA: Diagnosis not present

## 2019-08-23 DIAGNOSIS — F411 Generalized anxiety disorder: Secondary | ICD-10-CM | POA: Diagnosis not present

## 2019-08-23 DIAGNOSIS — Z8639 Personal history of other endocrine, nutritional and metabolic disease: Secondary | ICD-10-CM | POA: Diagnosis not present

## 2019-08-25 LAB — COMPREHENSIVE METABOLIC PANEL
ALT: 30 IU/L (ref 0–44)
AST: 24 IU/L (ref 0–40)
Albumin/Globulin Ratio: 2.2 (ref 1.2–2.2)
Albumin: 4.4 g/dL (ref 4.0–5.0)
Alkaline Phosphatase: 54 IU/L (ref 39–117)
BUN/Creatinine Ratio: 10 (ref 9–20)
BUN: 14 mg/dL (ref 6–24)
Bilirubin Total: 0.5 mg/dL (ref 0.0–1.2)
CO2: 26 mmol/L (ref 20–29)
Calcium: 9.5 mg/dL (ref 8.7–10.2)
Chloride: 101 mmol/L (ref 96–106)
Creatinine, Ser: 1.34 mg/dL — ABNORMAL HIGH (ref 0.76–1.27)
GFR calc Af Amer: 74 mL/min/{1.73_m2} (ref >59)
GFR calc non Af Amer: 64 mL/min/{1.73_m2} (ref >59)
Globulin, Total: 2 g/dL (ref 1.5–4.5)
Glucose: 95 mg/dL (ref 65–99)
Potassium: 4.9 mmol/L (ref 3.5–5.2)
Sodium: 139 mmol/L (ref 134–144)
Total Protein: 6.4 g/dL (ref 6.0–8.5)

## 2019-08-25 LAB — THYROID PANEL WITH TSH
Free Thyroxine Index: 1.5 (ref 1.2–4.9)
T3 Uptake Ratio: 28 % (ref 24–39)
T4, Total: 5.3 ug/dL (ref 4.5–12.0)
TSH: 13.5 u[IU]/mL — ABNORMAL HIGH (ref 0.450–4.500)

## 2019-08-25 LAB — TESTOSTERONE,FREE AND TOTAL
Testosterone, Free: 31.6 pg/mL — ABNORMAL HIGH (ref 6.8–21.5)
Testosterone: 1222 ng/dL — ABNORMAL HIGH (ref 264–916)

## 2019-08-25 LAB — CBC WITH DIFFERENTIAL/PLATELET
Basophils Absolute: 0.1 10*3/uL (ref 0.0–0.2)
Basos: 1 %
EOS (ABSOLUTE): 0.1 10*3/uL (ref 0.0–0.4)
Eos: 3 %
Hematocrit: 49.6 % (ref 37.5–51.0)
Hemoglobin: 16.6 g/dL (ref 13.0–17.7)
Immature Grans (Abs): 0 10*3/uL (ref 0.0–0.1)
Immature Granulocytes: 0 %
Lymphocytes Absolute: 1.2 10*3/uL (ref 0.7–3.1)
Lymphs: 22 %
MCH: 30.1 pg (ref 26.6–33.0)
MCHC: 33.5 g/dL (ref 31.5–35.7)
MCV: 90 fL (ref 79–97)
Monocytes Absolute: 0.5 10*3/uL (ref 0.1–0.9)
Monocytes: 9 %
Neutrophils Absolute: 3.5 10*3/uL (ref 1.4–7.0)
Neutrophils: 65 %
Platelets: 265 10*3/uL (ref 150–450)
RBC: 5.52 x10E6/uL (ref 4.14–5.80)
RDW: 12.8 % (ref 11.6–15.4)
WBC: 5.3 10*3/uL (ref 3.4–10.8)

## 2019-08-25 LAB — LIPID PANEL W/O CHOL/HDL RATIO
Cholesterol, Total: 167 mg/dL (ref 100–199)
HDL: 30 mg/dL — ABNORMAL LOW (ref >39)
LDL Chol Calc (NIH): 117 mg/dL — ABNORMAL HIGH (ref 0–99)
Triglycerides: 111 mg/dL (ref 0–149)
VLDL Cholesterol Cal: 20 mg/dL (ref 5–40)

## 2019-08-25 LAB — LEAD, BLOOD (ADULT >= 16 YRS): Lead-Whole Blood: 18 ug/dL — ABNORMAL HIGH (ref 0–4)

## 2019-08-25 LAB — VITAMIN D 25 HYDROXY (VIT D DEFICIENCY, FRACTURES): Vit D, 25-Hydroxy: 56.9 ng/mL (ref 30.0–100.0)

## 2019-08-25 NOTE — Progress Notes (Signed)
FYI released to [facility]:   Elevated red blood cells, hemoglobin and hematocrit likely from testosterone use and I recommend follow up immediately with his endocrinologist, if he needs a referral I am happy to place one in system.  Testosterone level is elevated at 1,222 ( normal 264-916) and free testosterone is elevated 31.6 (normal 6.8-21.5)and can lead to blood clots and other cardiovascular risks, technically he should have one more level drawn in the morning between 8 am and 10 am but with these levels will defer that to endocrinology. We do not prescribe testosterone or manage in this office.  TSH is still showing hypothyroidism at 13.5, recommend starting Levothyroxine( he has not liked side effects in the past) and follow up as well with his endocrinologist for this. Untreated thyroid disease can lead to many health risks.  He also does have elevated Lead- since he has work exposure he should follow up with his employer.  Hematology can also be consulted. Creatine is mildly elevated at 1.34, increase water intake. He can also reach out to his local health department. Vitamin D level is normal  These food are also known to reduce lead : Eat a Healthy Diet to Help Decrease Lead Absorption Milk and milk products, such as yogurt and cheese. Green leafy vegetables, including kale and turnip, mustard and collard greens. Calcium-fortified foods, such as orange juice, soy milk and tofu. Canned salmon and sardines.

## 2019-08-29 ENCOUNTER — Encounter: Payer: Self-pay | Admitting: Adult Health

## 2019-09-13 ENCOUNTER — Other Ambulatory Visit: Payer: Self-pay | Admitting: Neurosurgery

## 2019-09-13 DIAGNOSIS — Z981 Arthrodesis status: Secondary | ICD-10-CM | POA: Diagnosis not present

## 2019-09-13 DIAGNOSIS — Z6829 Body mass index (BMI) 29.0-29.9, adult: Secondary | ICD-10-CM | POA: Diagnosis not present

## 2019-09-13 DIAGNOSIS — M5412 Radiculopathy, cervical region: Secondary | ICD-10-CM | POA: Diagnosis not present

## 2019-09-13 DIAGNOSIS — M4722 Other spondylosis with radiculopathy, cervical region: Secondary | ICD-10-CM

## 2019-09-13 DIAGNOSIS — R030 Elevated blood-pressure reading, without diagnosis of hypertension: Secondary | ICD-10-CM | POA: Diagnosis not present

## 2019-09-13 NOTE — Progress Notes (Signed)
Patient: Timothy Powell, Male    DOB: 12/07/1975, 44 y.o.   MRN: [id number] Visit Date: 09/14/2019  Today's Provider: Marcille Buffy, FNP   Chief Complaint  Patient presents with  . New Patient (Initial Visit)   Subjective:    New Patient Timothy Powell is a 44 y.o. male who presents today for health maintenance and establish patient care, patient reports that he did not have a previous PCP. He feels fairly well, patient would like to address hypertension, patient reports that his average blood pressure reading at [facility] is 150/96. He reports exercising 3-4x a week. He reports he is sleeping fairly well, patient wakes up frequently at night but states that he takes Alprazolam to help with sleep.   He has not followed up with endocrinology for testosterone or hypothyroidism. He declines synthroid. Dr. Elyse Hsu he sees for thyroid and hypogonadism. Does not want to go back, he wanted his testosterone higher so took extra as baseline was 300-350 and he wanted levels to be higher. He has tried synthroid for 6 months and felt like he was in a fog.  He would consider second opinion last seen at Dr. Elyse Hsu office on 11/2018. He declines to go back to current endocrinologist.  Encounter Providers Encounter Date  Clayburn Pert Altheimer, MD (Attending) [phone] (Work) [phone] (Fax) [place], [place] [address] Endocrinology       He saw his neurosurgeon Dr. [name]  for numbness in right hand fingers since December he saw him yesterday for follow up. Left arm is normal now. MRI next Thursday and CT the 23rd.  Pain is improved from surgery on 06/24/19 he keeps follow ups.   Takes for sleep xanax 0.25mg  when he can not sleep, reports he has tried other medications with no relief. Denies any dependence or other drug use. He would like a refill. Does not use with alcohol   Lead in blood he requested last visit. He touches steel a lot and lead. He has been  using protection more and decreasing exposure. He has increased calcium in diet. He spoke with [place] health department.  ----------------------------------------------------------------- EKG prior to neurosurgery 06/13/2019 is normal.   Patient  denies any fever, body aches,chills, rash, chest pain, shortness of breath, nausea, vomiting, or diarrhea.    Review of Systems  HENT: Positive for hearing loss and tinnitus. Negative for congestion, dental problem, drooling, ear discharge, ear pain, facial swelling, mouth sores, nosebleeds, postnasal drip, rhinorrhea, sinus pressure, sinus pain, sneezing, sore throat, trouble swallowing and voice change.   Respiratory: Negative.   Cardiovascular: Negative.   Gastrointestinal: Negative.   Genitourinary: Negative.   Musculoskeletal: Positive for arthralgias, back pain and neck pain. Negative for gait problem, joint swelling, myalgias and neck stiffness.       Chronic followed by Dr. Rita Ohara.   Psychiatric/Behavioral: Negative.   All other systems reviewed and are negative.  Previous traumatic injuries see last note.  Long standing history hearing loss and tinnitus from services with loud sound exposures. Declines ENT or hearing evaluation. Denies any change.  Social History He  reports that he has never smoked. He has never used smokeless tobacco. He reports current alcohol use. He reports that he does not use drugs. Social History   Socioeconomic History  . Marital status: Single    Spouse name: Not on file  . Number of children: Not on file  . Years of education: Not on file  . Highest education  level: Not on file  Occupational History  . Not on file  Tobacco Use  . Smoking status: Never Smoker  . Smokeless tobacco: Never Used  Substance and Sexual Activity  . Alcohol use: Yes    Alcohol/week: 0.0 standard drinks    Comment: social  . Drug use: No  . Sexual activity: Not on file  Other Topics Concern  . Not on file  Social History  Narrative  . Not on file   Social Determinants of Health   Financial Resource Strain:   . Difficulty of Paying Living Expenses: Not on file  Food Insecurity:   . Worried About Charity fundraiser in the Last Year: Not on file  . Ran Out of Food in the Last Year: Not on file  Transportation Needs:   . Lack of Transportation (Medical): Not on file  . Lack of Transportation (Non-Medical): Not on file  Physical Activity:   . Days of Exercise per Week: Not on file  . Minutes of Exercise per Session: Not on file  Stress:   . Feeling of Stress : Not on file  Social Connections:   . Frequency of Communication with Friends and Family: Not on file  . Frequency of Social Gatherings with Friends and Family: Not on file  . Attends Religious Services: Not on file  . Active Member of Clubs or Organizations: Not on file  . Attends Archivist Meetings: Not on file  . Marital Status: Not on file    Patient Active Problem List   Diagnosis Date Noted  . Glenoid labral tear, left, initial encounter 06/15/2018  . Cauda equina syndrome ([place]) 12/02/2017  . Sacral pain 12/02/2017  . Neuropathic pain 12/02/2017  . Body mass index (bmi) 28.0-28.9, adult 10/21/2017  . Elevated blood pressure reading without diagnosis of hypertension 10/21/2017  . Foot drop, left foot 10/21/2017  . Muscle atrophy of lower extremity 10/21/2017  . Pain in thoracic spine 10/21/2017  . HNP (herniated nucleus pulposus), cervical 03/23/2017  . Nonallopathic lesion of lumbosacral region 11/18/2016  . Piriformis syndrome of right side 11/03/2016  . Degenerative cervical disc 10/16/2016  . Nonallopathic lesion of cervical region 10/16/2016  . Nonallopathic lesion of thoracic region 10/16/2016  . Nonallopathic lesion of sacral region 10/16/2016  . Paraparesis ([place]) 10/16/2016  . Chronic autoimmune thyroiditis 09/11/2016  . Primary hypothyroidism 09/11/2016  . Lateral epicondylitis of right elbow 10/23/2015  .  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 Elevated blood pressure 08/25/2011  . Generalized anxiety disorder 04/19/2009  . Renal function test abnormal 11/29/2007    Past Surgical History:  Procedure Laterality Date  . ankle muscle and skin graft Right 1997  . ANTERIOR CERVICAL DECOMP/DISCECTOMY FUSION N/A 03/23/2017   Procedure: Anterior Cervical Decompresion/Discectomy Fusion - Cervical Four-Cervical five - Cervical five-Cervical six - Cervical six-Cervical seven;  Surgeon: [name], MD;  Location: [facility];  Service: Neurosurgery;  Laterality: N/A;  . ANTERIOR CERVICAL DECOMP/DISCECTOMY FUSION N/A 06/15/2019   Procedure: Cervical seven Thoracic one Anterior cervical decompression/discectomy/fusion with removal of atlantis cervical plate;  Surgeon: [name], MD;  Location: [facility];  Service: Neurosurgery;  Laterality: N/A;  . BACK SURGERY  1997   fusion l4/5,  . ELBOW SURGERY Right 2017   repair tear  . 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 removal of skin and muscle graft Right 1997   infection  . repair bicep tear Right 2015  . SHOULDER SURGERY  2011  . skin  grafts Right    right ankle and arm    Family History  Family Status  Relation Name Status  . MGF  (Not Specified)  . PGM  (Not Specified)   His family history includes Diabetes in his paternal grandmother; Heart disease in his maternal grandfather; Pancreatic cancer in his paternal grandmother.     No Known Allergies  Previous Medications   ALPRAZOLAM (XANAX) 0.5 MG TABLET    Take 0.25 mg by mouth at bedtime as needed for anxiety.    MAGNESIUM OXIDE (MAG-OX) 400 MG TABLET    Take 400 mg by mouth daily.   MISC NATURAL PRODUCTS (GLUCOSAMINE CHOND COMPLEX/MSM) TABS    Take 2 tablets by mouth daily.   MULTIPLE VITAMIN (MULTI-VITAMINS) TABS    Take 2 tablets by mouth daily. [employer] MEGA MEN   TESTOSTERONE 10 MG/ACT (2%) GEL    Place onto the skin.   TESTOSTERONE CYPIONATE (DEPOTESTOTERONE CYPIONATE) 100 MG/ML  INJECTION    Inject 100 mg into the muscle every 14 (fourteen) days. For IM use only   TRAMADOL (ULTRAM) 50 MG TABLET    Take 50 mg by mouth every 6 (six) hours as needed (pain).    Patient Care Team: Flinchum, Kelby Aline, FNP as PCP - General (Family Medicine)      Objective:   Vitals: There were no vitals taken for this visit.   Physical Exam Constitutional:      General: He is not in acute distress.    Appearance: He is obese. He is not ill-appearing, toxic-appearing or diaphoretic.  HENT:     Right Ear: Tympanic membrane normal.     Left Ear: Tympanic membrane normal.     Nose: Nose normal. No congestion or rhinorrhea.     Mouth/Throat:     Mouth: Mucous membranes are moist.  Neck:     Vascular: No carotid bruit.  Cardiovascular:     Rate and Rhythm: Normal rate.     Pulses: Normal pulses.     Heart sounds: Normal heart sounds. No murmur. No friction rub. No gallop.   Musculoskeletal:     Cervical back: Normal range of motion and neck supple. No rigidity or tenderness.  Lymphadenopathy:     Cervical: No cervical adenopathy.  Skin:    General: Skin is warm and dry.     Capillary Refill: Capillary refill takes less than 2 seconds.     Findings: Lesion present. No rash. Rash is not macular or papular.          Comments: 2 areas skin colored, flaky nodular 2 cm in size approximately both, has had frozen in past. No drainage. No erythema.  Typical of actinic keratosis, however can not exclude squamous cell given increasing size. ( slow growth is reported)   Neurological:     Mental Status: He is alert.  Psychiatric:        Mood and Affect: Mood normal.        Behavior: Behavior normal.        Thought Content: Thought content normal.        Judgment: Judgment normal.      Depression Screen PHQ 2/9 Scores 09/14/2019  PHQ - 2 Score 0  PHQ- 9 Score 0      Assessment & Plan:     Routine Health Maintenance and Physical Exam  Exercise Activities and Dietary  recommendations Goals   None      There is no immunization history on file for this patient.  Health Maintenance  Topic Date Due  . HIV Screening  11/02/1989  . TETANUS/TDAP  11/02/1993  . INFLUENZA VACCINE  03/05/2019     Discussed health benefits of physical activity, and encouraged him to engage in regular exercise appropriate for his age and condition.   Yearly eye exam and dental exam recommended.   Advised patient call the office or your primary care doctor for an appointment if no improvement within 72 hours or if any symptoms change or worsen at any time  Advised ER or urgent Care if after hours or on weekend. Call 911 for emergency symptoms at any time.Patinet verbalized understanding of all instructions given/reviewed and treatment plan and has no further questions or concerns at this time.    1. Routine health maintenance He also had an elevated lead level, this is being followed by the health department.  He is trying to increase his diet with foods that absorb.  He reports he is under the acceptable level for the job that he does, however discussed long-term effects of lead.  He is also contacted by the health department who is very concerned.  Right now he is not undergoing any treatment. - POCT urinalysis dipstick  2. BMI 29.7 He is very physically fit, he exercises regularly tries remain active though his neck has given him pain and he has been limited exercise due to his recent spinal surgery and previous traumas. Encouraged weight loss and exercise diet changes.   3. Insomnia, unspecified type He takes very low-dose alprazolam for insomnia, he request a refill for this, he reports he has tried all other things and that nothing helps him relax and sleep except for this.  Will give if patient takes as directed.  Also discussed antidepressant such as Lexapro that would help with long-term effects.  Patient declined at this time.  Gust side effects of benzodiazepines and  not to drive.  Also discussed long-term risk and addictive nature to benzodiazepines.  4. Screening for prostate cancer PSA done at this stage due to testosterone use.  5. High risk medication use- testosterone  - PSA - Ambulatory referral to Endocrinology  6. Abnormal results of function studies of other organs and systems  Is on testosterone therapy. - PSA  7. Essential hypertension  Will give hydrochlorothiazide 12.5 mg daily, patient is aware that we can increase this dose however he would like to start at a low dose to see how it affects him.  He has been concerned about his blood pressure for a while because he has been having elevated blood pressure readings, he had those readings also before his surgery.  His EKG was normal.  He has no other associated symptoms no shortness of breath no chest pain, no radiating pain no calf pain or swelling. Discussed hyperthyroidism could be because of blood pressure elevation as well. Meds ordered this encounter  Medications  . hydrochlorothiazide (HYDRODIURIL) 12.5 MG tablet    Sig: Take 1 tablet (12.5 mg total) by mouth daily.    Dispense:  30 tablet    Refill:  0  . ALPRAZolam (XANAX) 0.5 MG tablet    Sig: Take 0.5 tablets (0.25 mg total) by mouth at bedtime as needed for anxiety.    Dispense:  15 tablet    Refill:  0    8. Hypothyroidism, unspecified type- not treated patient declines.  He does not desire to treat with Synthroid, he has seen endocrinology in the past, he does agree to a referral to  another endocrinologist that they can discussed as well. - Ambulatory referral to Endocrinology Hypertension, also discussed the risk of untreated hypothyroidism.  He will follow up with endocrinology.  9. Neuropathic pain He continues to follow-up with Dr [name].   10. Muscle atrophy of lower extremity- left worse than right  Chronic, no change history of cauda equina syndrome with an accident years ago.  11. Degenerative cervical  disc Reviewed history, follow-up with Dr. Rita Ohara.  12. Spinal surgery in prior 3 months- 06/24/19 cervical  He is doing well keeping his follow-ups with Dr. [name].  Has a new right hand paresthesias that go into his fingers, he is having upcoming imaging that was ordered by Dr. Chales Salmon.  13. Elevated testosterone level in male Vitals and the dangers of elevated testosterone levels, he reported that his last endocrinologist wanted to stay within 300-400, he has been dosing his testosterone himself, but he has not had his level checked in a while and he was reported that his level is out of range and elevated.  He needs a reduction in his testosterone dosage and he needs to follow-up with endocrinology.  Discussed blood clots, prostate cancer and other etiologies that can form from increased testosterone. - Ambulatory referral to Endocrinology  14. Skin change Left lower anterior arm with likely actinic keratosis was however cannot rule out squamous cell.  He will be referred to dermatology, he has fair complexion with freckles and reddish hair. - Ambulatory referral to Dermatology -------------------------------------------------------------------- Please schedule a follow up appointment in 1 month.  The entirety of the information documented in the History of Present Illness, Review of Systems and Physical Exam were personally obtained by me. Portions of this information were initially documented by the  Certified Medical Assistant whose name is documented in [place] and reviewed by me for thoroughness and accuracy.  I have personally performed the exam and reviewed the chart and it is accurate to the best of my knowledge.  Haematologist has been used and any errors in dictation or transcription are unintentional.  Kelby Aline. [facility]

## 2019-09-14 ENCOUNTER — Ambulatory Visit (INDEPENDENT_AMBULATORY_CARE_PROVIDER_SITE_OTHER): Payer: Medicare Other | Admitting: Adult Health

## 2019-09-14 ENCOUNTER — Encounter: Payer: Self-pay | Admitting: Adult Health

## 2019-09-14 ENCOUNTER — Other Ambulatory Visit: Payer: Self-pay

## 2019-09-14 VITALS — BP 138/94 | HR 92 | Temp 96.9°F | Resp 16 | Ht 70.0 in | Wt 206.8 lb

## 2019-09-14 DIAGNOSIS — Z125 Encounter for screening for malignant neoplasm of prostate: Secondary | ICD-10-CM

## 2019-09-14 DIAGNOSIS — Z79899 Other long term (current) drug therapy: Secondary | ICD-10-CM | POA: Diagnosis not present

## 2019-09-14 DIAGNOSIS — G4700 Insomnia, unspecified: Secondary | ICD-10-CM | POA: Diagnosis not present

## 2019-09-14 DIAGNOSIS — R948 Abnormal results of function studies of other organs and systems: Secondary | ICD-10-CM | POA: Diagnosis not present

## 2019-09-14 DIAGNOSIS — Z6829 Body mass index (BMI) 29.0-29.9, adult: Secondary | ICD-10-CM

## 2019-09-14 DIAGNOSIS — M792 Neuralgia and neuritis, unspecified: Secondary | ICD-10-CM | POA: Diagnosis not present

## 2019-09-14 DIAGNOSIS — M6258 Muscle wasting and atrophy, not elsewhere classified, other site: Secondary | ICD-10-CM | POA: Diagnosis not present

## 2019-09-14 DIAGNOSIS — M5030 Other cervical disc degeneration, unspecified cervical region: Secondary | ICD-10-CM

## 2019-09-14 DIAGNOSIS — I10 Essential (primary) hypertension: Secondary | ICD-10-CM | POA: Diagnosis not present

## 2019-09-14 DIAGNOSIS — E039 Hypothyroidism, unspecified: Secondary | ICD-10-CM | POA: Diagnosis not present

## 2019-09-14 DIAGNOSIS — E349 Endocrine disorder, unspecified: Secondary | ICD-10-CM | POA: Diagnosis not present

## 2019-09-14 DIAGNOSIS — Z9889 Other specified postprocedural states: Secondary | ICD-10-CM | POA: Diagnosis not present

## 2019-09-14 DIAGNOSIS — Z0000 Encounter for general adult medical examination without abnormal findings: Secondary | ICD-10-CM

## 2019-09-14 DIAGNOSIS — R239 Unspecified skin changes: Secondary | ICD-10-CM

## 2019-09-14 DIAGNOSIS — R198 Other specified symptoms and signs involving the digestive system and abdomen: Secondary | ICD-10-CM | POA: Diagnosis not present

## 2019-09-14 DIAGNOSIS — R7989 Other specified abnormal findings of blood chemistry: Secondary | ICD-10-CM

## 2019-09-14 LAB — POCT URINALYSIS DIPSTICK
Bilirubin, UA: NEGATIVE
Blood, UA: NEGATIVE
Glucose, UA: NEGATIVE
Leukocytes, UA: NEGATIVE
Nitrite, UA: NEGATIVE
Protein, UA: POSITIVE — AB
Spec Grav, UA: 1.01 (ref 1.010–1.025)
Urobilinogen, UA: 0.2 E.U./dL
pH, UA: 6.5 (ref 5.0–8.0)

## 2019-09-14 MED ORDER — ALPRAZOLAM 0.5 MG PO TABS: 0.2500 mg | ORAL_TABLET | Freq: Every evening | ORAL | 0 refills | Status: DC | PRN

## 2019-09-14 MED ORDER — HYDROCHLOROTHIAZIDE 12.5 MG PO TABS: 12.5000 mg | ORAL_TABLET | Freq: Every day | ORAL | 0 refills | Status: DC

## 2019-09-14 NOTE — Patient Instructions (Addendum)
Managing Your Hypertension Hypertension is commonly called high blood pressure. This is when the force of your blood pressing against the walls of your arteries is too strong. Arteries are blood vessels that carry blood from your heart throughout your body. Hypertension forces the heart to work harder to pump blood, and may cause the arteries to become narrow or stiff. Having untreated or uncontrolled hypertension can cause heart attack, stroke, kidney disease, and other problems. What are blood pressure readings? A blood pressure reading consists of a higher number over a lower number. Ideally, your blood pressure should be below 120/80. The first ("top") number is called the systolic pressure. It is a measure of the pressure in your arteries as your heart beats. The second ("bottom") number is called the diastolic pressure. It is a measure of the pressure in your arteries as the heart relaxes. What does my blood pressure reading mean? Blood pressure is classified into four stages. Based on your blood pressure reading, your health care provider may use the following stages to determine what type of treatment you need, if any. Systolic pressure and diastolic pressure are measured in a unit called mm Hg. Normal  Systolic pressure: below 120.  Diastolic pressure: below 80. Elevated  Systolic pressure: 120-129.  Diastolic pressure: below 80. Hypertension stage 1  Systolic pressure: 130-139.  Diastolic pressure: 80-89. Hypertension stage 2  Systolic pressure: 140 or above.  Diastolic pressure: 90 or above. What health risks are associated with hypertension? Managing your hypertension is an important responsibility. Uncontrolled hypertension can lead to:  A heart attack.  A stroke.  A weakened blood vessel (aneurysm).  Heart failure.  Kidney damage.  Eye damage.  Metabolic syndrome.  Memory and concentration problems. What changes can I make to manage my  hypertension? Hypertension can be managed by making lifestyle changes and possibly by taking medicines. Your health care provider will help you make a plan to bring your blood pressure within a normal range. Eating and drinking   Eat a diet that is high in fiber and potassium, and low in salt (sodium), added sugar, and fat. An example eating plan is called the DASH (Dietary Approaches to Stop Hypertension) diet. To eat this way: ? Eat plenty of fresh fruits and vegetables. Try to fill half of your plate at each meal with fruits and vegetables. ? Eat whole grains, such as whole wheat pasta, brown rice, or whole grain bread. Fill about one quarter of your plate with whole grains. ? Eat low-fat diary products. ? Avoid fatty cuts of meat, processed or cured meats, and poultry with skin. Fill about one quarter of your plate with lean proteins such as fish, chicken without skin, beans, eggs, and tofu. ? Avoid premade and processed foods. These tend to be higher in sodium, added sugar, and fat.  Reduce your daily sodium intake. Most people with hypertension should eat less than 1,500 mg of sodium a day.  Limit alcohol intake to no more than 1 drink a day for nonpregnant women and 2 drinks a day for men. One drink equals 12 oz of beer, 5 oz of wine, or 1 oz of hard liquor. Lifestyle  Work with your health care provider to maintain a healthy body weight, or to lose weight. Ask what an ideal weight is for you.  Get at least 30 minutes of exercise that causes your heart to beat faster (aerobic exercise) most days of the week. Activities may include walking, swimming, or biking.  Include exercise   to strengthen your muscles (resistance exercise), such as weight lifting, as part of your weekly exercise routine. Try to do these types of exercises for 30 minutes at least 3 days a week.  Do not use any products that contain nicotine or tobacco, such as cigarettes and e-cigarettes. If you need help quitting,  ask your health care provider.  Control any long-term (chronic) conditions you have, such as high cholesterol or diabetes. Monitoring  Monitor your blood pressure at home as told by your health care provider. Your personal target blood pressure may vary depending on your medical conditions, your age, and other factors.  Have your blood pressure checked regularly, as often as told by your health care provider. Working with your health care provider  Review all the medicines you take with your health care provider because there may be side effects or interactions.  Talk with your health care provider about your diet, exercise habits, and other lifestyle factors that may be contributing to hypertension.  Visit your health care provider regularly. Your health care provider can help you create and adjust your plan for managing hypertension. Will I need medicine to control my blood pressure? Your health care provider may prescribe medicine if lifestyle changes are not enough to get your blood pressure under control, and if:  Your systolic blood pressure is 130 or higher.  Your diastolic blood pressure is 80 or higher. Take medicines only as told by your health care provider. Follow the directions carefully. Blood pressure medicines must be taken as prescribed. The medicine does not work as well when you skip doses. Skipping doses also puts you at risk for problems. Contact a health care provider if:  You think you are having a reaction to medicines you have taken.  You have repeated (recurrent) headaches.  You feel dizzy.  You have swelling in your ankles.  You have trouble with your vision. Get help right away if:  You develop a severe headache or confusion.  You have unusual weakness or numbness, or you feel faint.  You have severe pain in your chest or abdomen.  You vomit repeatedly.  You have trouble breathing. Summary  Hypertension is when the force of blood pumping  through your arteries is too strong. If this condition is not controlled, it may put you at risk for serious complications.  Your personal target blood pressure may vary depending on your medical conditions, your age, and other factors. For most people, a normal blood pressure is less than 120/80.  Hypertension is managed by lifestyle changes, medicines, or both. Lifestyle changes include weight loss, eating a healthy, low-sodium diet, exercising more, and limiting alcohol. This information is not intended to replace advice given to you by your health care provider. Make sure you discuss any questions you have with your health care provider. Document Revised: 11/12/2018 Document Reviewed: 06/18/2016 Elsevier Patient Education  [place]. Hypertension, Adult Hypertension is another name for high blood pressure. High blood pressure forces your heart to work harder to pump blood. This can cause problems over time. There are two numbers in a blood pressure reading. There is a top number (systolic) over a bottom number (diastolic). It is best to have a blood pressure that is below 120/80. Healthy choices can help lower your blood pressure, or you may need medicine to help lower it. What are the causes? The cause of this condition is not known. Some conditions may be related to high blood pressure. What increases the risk?  Smoking.  Having type 2 diabetes mellitus, high cholesterol, or both.  Not getting enough exercise or physical activity.  Being overweight.  Having too much fat, sugar, calories, or salt (sodium) in your diet.  Drinking too much alcohol.  Having long-term (chronic) kidney disease.  Having a family history of high blood pressure.  Age. Risk increases with age.  Race. You may be at higher risk if you are African American.  Gender. Men are at higher risk than women before age 63. After age 76, women are at higher risk than men.  Having obstructive sleep  apnea.  Stress. What are the signs or symptoms?  High blood pressure may not cause symptoms. Very high blood pressure (hypertensive crisis) may cause: ? Headache. ? Feelings of worry or nervousness (anxiety). ? Shortness of breath. ? Nosebleed. ? A feeling of being sick to your stomach (nausea). ? Throwing up (vomiting). ? Changes in how you see. ? Very bad chest pain. ? Seizures. How is this treated?  This condition is treated by making healthy lifestyle changes, such as: ? Eating healthy foods. ? Exercising more. ? Drinking less alcohol.  Your health care provider may prescribe medicine if lifestyle changes are not enough to get your blood pressure under control, and if: ? Your top number is above 130. ? Your bottom number is above 80.  Your personal target blood pressure may vary. Follow these instructions at home: Eating and drinking   If told, follow the DASH eating plan. To follow this plan: ? Fill one half of your plate at each meal with fruits and vegetables. ? Fill one fourth of your plate at each meal with whole grains. Whole grains include whole-wheat pasta, brown rice, and whole-grain bread. ? Eat or drink low-fat dairy products, such as skim milk or low-fat yogurt. ? Fill one fourth of your plate at each meal with low-fat (lean) proteins. Low-fat proteins include fish, chicken without skin, eggs, beans, and tofu. ? Avoid fatty meat, cured and processed meat, or chicken with skin. ? Avoid pre-made or processed food.  Eat less than 1,500 mg of salt each day.  Do not drink alcohol if: ? Your doctor tells you not to drink. ? You are pregnant, may be pregnant, or are planning to become pregnant.  If you drink alcohol: ? Limit how much you use to:  0-1 drink a day for women.  0-2 drinks a day for men. ? Be aware of how much alcohol is in your drink. In the [place] one drink equals one 12 oz bottle of beer (355 mL), one 5 oz glass of wine (148 mL), or one 1 oz  glass of hard liquor (44 mL). Lifestyle   Work with your doctor to stay at a healthy weight or to lose weight. Ask your doctor what the best weight is for you.  Get at least 30 minutes of exercise most days of the week. This may include walking, swimming, or biking.  Get at least 30 minutes of exercise that strengthens your muscles (resistance exercise) at least 3 days a week. This may include lifting weights or doing Pilates.  Do not use any products that contain nicotine or tobacco, such as cigarettes, e-cigarettes, and chewing tobacco. If you need help quitting, ask your doctor.  Check your blood pressure at home as told by your doctor.  Keep all follow-up visits as told by your doctor. This is important. Medicines  Take over-the-counter and prescription medicines only as told by your  doctor. Follow directions carefully.  Do not skip doses of blood pressure medicine. The medicine does not work as well if you skip doses. Skipping doses also puts you at risk for problems.  Ask your doctor about side effects or reactions to medicines that you should watch for. Contact a doctor if you:  Think you are having a reaction to the medicine you are taking.  Have headaches that keep coming back (recurring).  Feel dizzy.  Have swelling in your ankles.  Have trouble with your vision. Get help right away if you:  Get a very bad headache.  Start to feel mixed up (confused).  Feel weak or numb.  Feel faint.  Have very bad pain in your: ? Chest. ? Belly (abdomen).  Throw up more than once.  Have trouble breathing. Summary  Hypertension is another name for high blood pressure.  High blood pressure forces your heart to work harder to pump blood.  For most people, a normal blood pressure is less than 120/80.  Making healthy choices can help lower blood pressure. If your blood pressure does not get lower with healthy choices, you may need to take medicine. This information is  not intended to replace advice given to you by your health care provider. Make sure you discuss any questions you have with your health care provider. Document Revised: 03/31/2018 Document Reviewed: 03/31/2018 Elsevier Patient Education  [place]. Hydrochlorothiazide, HCTZ Oral Capsules or Tablets What is this medicine? HYDROCHLOROTHIAZIDE (hye droe klor oh THYE a zide) is a diuretic. It helps you make more urine and to lose salt and excess water from your body. It treats swelling from heart, kidney, or liver disease. It also treats high blood pressure. This medicine may be used for other purposes; ask your health care provider or pharmacist if you have questions. COMMON BRAND NAME(S): Esidrix, Ezide, HydroDIURIL, Microzide, Oretic, Zide What should I tell my health care provider before I take this medicine? They need to know if you have any of these conditions:  diabetes  gout  immune system problems, like lupus  kidney disease or kidney stones  liver disease  pancreatitis  small amount of urine or difficulty passing urine  an unusual or allergic reaction to hydrochlorothiazide, sulfa drugs, other medicines, foods, dyes, or preservatives  pregnant or trying to get pregnant  breast-feeding How should I use this medicine? Take this drug by mouth. Take it as directed on the prescription label at the same time every day. You can take it with or without food. If it upsets your stomach, take it with food. Keep taking it unless your health care provider tells you to stop. Talk to your health care provider about the use of this drug in children. While it may be prescribed for children as young as newborns for selected conditions, precautions do apply. Overdosage: If you think you have taken too much of this medicine contact a poison control center or emergency room at once. NOTE: This medicine is only for you. Do not share this medicine with others. What if I miss a dose? If  you miss a dose, take it as soon as you can. If it is almost time for your next dose, take only that dose. Do not take double or extra doses. What may interact with this medicine?  cholestyramine  colestipol  digoxin  dofetilide  lithium  medicines for blood pressure  medicines for diabetes  medicines that relax muscles for surgery  other diuretics  steroid medicines  like prednisone or cortisone This list may not describe all possible interactions. Give your health care provider a list of all the medicines, herbs, non-prescription drugs, or dietary supplements you use. Also tell them if you smoke, drink alcohol, or use illegal drugs. Some items may interact with your medicine. What should I watch for while using this medicine? Visit your doctor or health care professional for regular checks on your progress. Check your blood pressure as directed. Ask your doctor or health care professional what your blood pressure should be and when you should contact him or her. Talk to your health care professional about your risk of skin cancer. You may be more at risk for skin cancer if you take this medicine. This medicine can make you more sensitive to the sun. Keep out of the sun. If you cannot avoid being in the sun, wear protective clothing and use sunscreen. Do not use sun lamps or tanning beds/booths. You may need to be on a special diet while taking this medicine. Ask your doctor. Check with your doctor or health care professional if you get an attack of severe diarrhea, nausea and vomiting, or if you sweat a lot. The loss of too much body fluid can make it dangerous for you to take this medicine. You may get drowsy or dizzy. Do not drive, use machinery, or do anything that needs mental alertness until you know how this medicine affects you. Do not stand or sit up quickly, especially if you are an older patient. This reduces the risk of dizzy or fainting spells. Alcohol may interfere with the  effect of this medicine. Avoid alcoholic drinks. This medicine may increase blood sugar. Ask your healthcare provider if changes in diet or medicines are needed if you have diabetes. What side effects may I notice from receiving this medicine? Side effects that you should report to your doctor or health care professional as soon as possible:  allergic reactions such as skin rash or itching, hives, swelling of the lips, mouth, tongue, or throat  changes in vision  chest pain  eye pain  fast or irregular heartbeat  feeling faint or lightheaded, falls  gout attack  muscle pain or cramps  pain or difficulty when passing urine  pain, tingling, numbness in the hands or feet  redness, blistering, peeling or loosening of the skin, including inside the mouth   signs and symptoms of high blood sugar such as being more thirsty or hungry or having to urinate more than normal. You may also feel very tired or have blurry vision.  unusually weak Side effects that usually do not require medical attention (report to your doctor or health care professional if they continue or are bothersome):  change in sex drive or performance  dry mouth  headache  stomach upset This list may not describe all possible side effects. Call your doctor for medical advice about side effects. You may report side effects to FDA at 1-800-FDA-1088. Where should I keep my medicine? Keep out of the reach of children and pets. Store at room temperature between 20 and 25 degrees C (68 and 77 degrees F). Protect from light and moisture. Keep the container tightly closed. Do not freeze. Throw away any unused drug after the expiration date. NOTE: This sheet is a summary. It may not cover all possible information. If you have questions about this medicine, talk to your doctor, pharmacist, or health care provider.  2020 Elsevier/Gold Standard (2019-03-24 16:52:59)  DASH Eating Plan DASH  stands for "Dietary Approaches  to Stop Hypertension." The DASH eating plan is a healthy eating plan that has been shown to reduce high blood pressure (hypertension). It may also reduce your risk for type 2 diabetes, heart disease, and stroke. The DASH eating plan may also help with weight loss. What are tips for following this plan?  General guidelines  Avoid eating more than 2,300 mg (milligrams) of salt (sodium) a day. If you have hypertension, you may need to reduce your sodium intake to 1,500 mg a day.  Limit alcohol intake to no more than 1 drink a day for nonpregnant women and 2 drinks a day for men. One drink equals 12 oz of beer, 5 oz of wine, or 1 oz of hard liquor.  Work with your health care provider to maintain a healthy body weight or to lose weight. Ask what an ideal weight is for you.  Get at least 30 minutes of exercise that causes your heart to beat faster (aerobic exercise) most days of the week. Activities may include walking, swimming, or biking.  Work with your health care provider or diet and nutrition specialist (dietitian) to adjust your eating plan to your individual calorie needs. Reading food labels   Check food labels for the amount of sodium per serving. Choose foods with less than 5 percent of the Daily Value of sodium. Generally, foods with less than 300 mg of sodium per serving fit into this eating plan.  To find whole grains, look for the word "whole" as the first word in the ingredient list. Shopping  Buy products labeled as "low-sodium" or "no salt added."  Buy fresh foods. Avoid canned foods and premade or frozen meals. Cooking  Avoid adding salt when cooking. Use salt-free seasonings or herbs instead of table salt or sea salt. Check with your health care provider or pharmacist before using salt substitutes.  Do not fry foods. Cook foods using healthy methods such as baking, boiling, grilling, and broiling instead.  Cook with heart-healthy oils, such as olive, canola, soybean, or  sunflower oil. Meal planning  Eat a balanced diet that includes: ? 5 or more servings of fruits and vegetables each day. At each meal, try to fill half of your plate with fruits and vegetables. ? Up to 6-8 servings of whole grains each day. ? Less than 6 oz of lean meat, poultry, or fish each day. A 3-oz serving of meat is about the same size as a deck of cards. One egg equals 1 oz. ? 2 servings of low-fat dairy each day. ? A serving of nuts, seeds, or beans 5 times each week. ? Heart-healthy fats. Healthy fats called Omega-3 fatty acids are found in foods such as flaxseeds and coldwater fish, like sardines, salmon, and mackerel.  Limit how much you eat of the following: ? Canned or prepackaged foods. ? Food that is high in trans fat, such as fried foods. ? Food that is high in saturated fat, such as fatty meat. ? Sweets, desserts, sugary drinks, and other foods with added sugar. ? Full-fat dairy products.  Do not salt foods before eating.  Try to eat at least 2 vegetarian meals each week.  Eat more home-cooked food and less restaurant, buffet, and fast food.  When eating at a restaurant, ask that your food be prepared with less salt or no salt, if possible. What foods are recommended? The items listed may not be a complete list. Talk with your dietitian about what dietary choices  are best for you. Grains Whole-grain or whole-wheat bread. Whole-grain or whole-wheat pasta. Brown rice. [place]. Bulgur. Whole-grain and low-sodium cereals. Pita bread. Low-fat, low-sodium crackers. Whole-wheat flour tortillas. Vegetables Fresh or frozen vegetables (raw, steamed, roasted, or grilled). Low-sodium or reduced-sodium tomato and vegetable juice. Low-sodium or reduced-sodium tomato sauce and tomato paste. Low-sodium or reduced-sodium canned vegetables. Fruits All fresh, dried, or frozen fruit. Canned fruit in natural juice (without added sugar). Meat and other protein foods Skinless  chicken or [place]. Ground chicken or [place]. Pork with fat trimmed off. Fish and seafood. Egg whites. Dried beans, peas, or lentils. Unsalted nuts, nut butters, and seeds. Unsalted canned beans. Lean cuts of beef with fat trimmed off. Low-sodium, lean deli meat. Dairy Low-fat (1%) or fat-free (skim) milk. Fat-free, low-fat, or reduced-fat cheeses. Nonfat, low-sodium ricotta or cottage cheese. Low-fat or nonfat yogurt. Low-fat, low-sodium cheese. Fats and oils Soft margarine without trans fats. Vegetable oil. Low-fat, reduced-fat, or light mayonnaise and salad dressings (reduced-sodium). Canola, safflower, olive, soybean, and sunflower oils. Avocado. Seasoning and other foods Herbs. Spices. Seasoning mixes without salt. Unsalted popcorn and pretzels. Fat-free sweets. What foods are not recommended? The items listed may not be a complete list. Talk with your dietitian about what dietary choices are best for you. Grains Baked goods made with fat, such as croissants, muffins, or some breads. Dry pasta or rice meal packs. Vegetables Creamed or fried vegetables. Vegetables in a cheese sauce. Regular canned vegetables (not low-sodium or reduced-sodium). Regular canned tomato sauce and paste (not low-sodium or reduced-sodium). Regular tomato and vegetable juice (not low-sodium or reduced-sodium). Angie Fava. Olives. Fruits Canned fruit in a light or heavy syrup. Fried fruit. Fruit in cream or butter sauce. Meat and other protein foods Fatty cuts of meat. Ribs. Fried meat. Berniece Salines. Sausage. Bologna and other processed lunch meats. Salami. Fatback. Hotdogs. Bratwurst. Salted nuts and seeds. Canned beans with added salt. Canned or smoked fish. Whole eggs or egg yolks. Chicken or [place] with skin. Dairy Whole or 2% milk, cream, and half-and-half. Whole or full-fat cream cheese. Whole-fat or sweetened yogurt. Full-fat cheese. Nondairy creamers. Whipped toppings. Processed cheese and cheese spreads. Fats and  oils Butter. Stick margarine. Lard. Shortening. Ghee. Bacon fat. Tropical oils, such as coconut, palm kernel, or palm oil. Seasoning and other foods Salted popcorn and pretzels. Onion salt, garlic salt, seasoned salt, table salt, and sea salt. Worcestershire sauce. Tartar sauce. Barbecue sauce. Teriyaki sauce. Soy sauce, including reduced-sodium. Steak sauce. Canned and packaged gravies. Fish sauce. Oyster sauce. Cocktail sauce. Horseradish that you find on the shelf. Ketchup. Mustard. Meat flavorings and tenderizers. Bouillon cubes. Hot sauce and Tabasco sauce. Premade or packaged marinades. Premade or packaged taco seasonings. Relishes. Regular salad dressings. Where to find more information:  National Heart, Lung, and [place]: https://wilson-eaton.com/  American Heart Association: www.heart.org Summary  The DASH eating plan is a healthy eating plan that has been shown to reduce high blood pressure (hypertension). It may also reduce your risk for type 2 diabetes, heart disease, and stroke.  With the DASH eating plan, you should limit salt (sodium) intake to 2,300 mg a day. If you have hypertension, you may need to reduce your sodium intake to 1,500 mg a day.  When on the DASH eating plan, aim to eat more fresh fruits and vegetables, whole grains, lean proteins, low-fat dairy, and heart-healthy fats.  Work with your health care provider or diet and nutrition specialist (dietitian) to adjust your eating plan to your individual calorie needs.  This information is not intended to replace advice given to you by your health care provider. Make sure you discuss any questions you have with your health care provider. Document Revised: 07/03/2017 Document Reviewed: 07/14/2016 Elsevier Patient Education  2020 [place] ordered this encounter  Medications  . hydrochlorothiazide (HYDRODIURIL) 12.5 MG tablet    Sig: Take 1 tablet (12.5 mg total) by mouth daily.    Dispense:  30 tablet     Refill:  0  . ALPRAZolam (XANAX) 0.5 MG tablet    Sig: Take 0.5 tablets (0.25 mg total) by mouth at bedtime as needed for anxiety.    Dispense:  15 tablet    Refill:  0   '  Hypertension, Adult High blood pressure (hypertension) is when the force of blood pumping through the arteries is too strong. The arteries are the blood vessels that carry blood from the heart throughout the body. Hypertension forces the heart to work harder to pump blood and may cause arteries to become narrow or stiff. Untreated or uncontrolled hypertension can cause a heart attack, heart failure, a stroke, kidney disease, and other problems. A blood pressure reading consists of a higher number over a lower number. Ideally, your blood pressure should be below 120/80. The first ("top") number is called the systolic pressure. It is a measure of the pressure in your arteries as your heart beats. The second ("bottom") number is called the diastolic pressure. It is a measure of the pressure in your arteries as the heart relaxes. What are the causes? The exact cause of this condition is not known. There are some conditions that result in or are related to high blood pressure. What increases the risk? Some risk factors for high blood pressure are under your control. The following factors may make you more likely to develop this condition:  Smoking.  Having type 2 diabetes mellitus, high cholesterol, or both.  Not getting enough exercise or physical activity.  Being overweight.  Having too much fat, sugar, calories, or salt (sodium) in your diet.  Drinking too much alcohol. Some risk factors for high blood pressure may be difficult or impossible to change. Some of these factors include:  Having chronic kidney disease.  Having a family history of high blood pressure.  Age. Risk increases with age.  Race. You may be at higher risk if you are African American.  Gender. Men are at higher risk than women before age 56.  After age 1, women are at higher risk than men.  Having obstructive sleep apnea.  Stress. What are the signs or symptoms? High blood pressure may not cause symptoms. Very high blood pressure (hypertensive crisis) may cause:  Headache.  Anxiety.  Shortness of breath.  Nosebleed.  Nausea and vomiting.  Vision changes.  Severe chest pain.  Seizures. How is this diagnosed? This condition is diagnosed by measuring your blood pressure while you are seated, with your arm resting on a flat surface, your legs uncrossed, and your feet flat on the floor. The cuff of the blood pressure monitor will be placed directly against the skin of your upper arm at the level of your heart. It should be measured at least twice using the same arm. Certain conditions can cause a difference in blood pressure between your right and left arms. Certain factors can cause blood pressure readings to be lower or higher than normal for a short period of time:  When your blood pressure is higher when you are  in a health care provider's office than when you are at home, this is called white coat hypertension. Most people with this condition do not need medicines.  When your blood pressure is higher at home than when you are in a health care provider's office, this is called masked hypertension. Most people with this condition may need medicines to control blood pressure. If you have a high blood pressure reading during one visit or you have normal blood pressure with other risk factors, you may be asked to:  Return on a different day to have your blood pressure checked again.  Monitor your blood pressure at home for 1 week or longer. If you are diagnosed with hypertension, you may have other blood or imaging tests to help your health care provider understand your overall risk for other conditions. How is this treated? This condition is treated by making healthy lifestyle changes, such as eating healthy foods,  exercising more, and reducing your alcohol intake. Your health care provider may prescribe medicine if lifestyle changes are not enough to get your blood pressure under control, and if:  Your systolic blood pressure is above 130.  Your diastolic blood pressure is above 80. Your personal target blood pressure may vary depending on your medical conditions, your age, and other factors. Follow these instructions at home: Eating and drinking   Eat a diet that is high in fiber and potassium, and low in sodium, added sugar, and fat. An example eating plan is called the DASH (Dietary Approaches to Stop Hypertension) diet. To eat this way: ? Eat plenty of fresh fruits and vegetables. Try to fill one half of your plate at each meal with fruits and vegetables. ? Eat whole grains, such as whole-wheat pasta, brown rice, or whole-grain bread. Fill about one fourth of your plate with whole grains. ? Eat or drink low-fat dairy products, such as skim milk or low-fat yogurt. ? Avoid fatty cuts of meat, processed or cured meats, and poultry with skin. Fill about one fourth of your plate with lean proteins, such as fish, chicken without skin, beans, eggs, or tofu. ? Avoid pre-made and processed foods. These tend to be higher in sodium, added sugar, and fat.  Reduce your daily sodium intake. Most people with hypertension should eat less than 1,500 mg of sodium a day.  Do not drink alcohol if: ? Your health care provider tells you not to drink. ? You are pregnant, may be pregnant, or are planning to become pregnant.  If you drink alcohol: ? Limit how much you use to:  0-1 drink a day for women.  0-2 drinks a day for men. ? Be aware of how much alcohol is in your drink. In the [place] one drink equals one 12 oz bottle of beer (355 mL), one 5 oz glass of wine (148 mL), or one 1 oz glass of hard liquor (44 mL). Lifestyle   Work with your health care provider to maintain a healthy body weight or to lose  weight. Ask what an ideal weight is for you.  Get at least 30 minutes of exercise most days of the week. Activities may include walking, swimming, or biking.  Include exercise to strengthen your muscles (resistance exercise), such as Pilates or lifting weights, as part of your weekly exercise routine. Try to do these types of exercises for 30 minutes at least 3 days a week.  Do not use any products that contain nicotine or tobacco, such as cigarettes, e-cigarettes, and chewing  tobacco. If you need help quitting, ask your health care provider.  Monitor your blood pressure at home as told by your health care provider.  Keep all follow-up visits as told by your health care provider. This is important. Medicines  Take over-the-counter and prescription medicines only as told by your health care provider. Follow directions carefully. Blood pressure medicines must be taken as prescribed.  Do not skip doses of blood pressure medicine. Doing this puts you at risk for problems and can make the medicine less effective.  Ask your health care provider about side effects or reactions to medicines that you should watch for. Contact a health care provider if you:  Think you are having a reaction to a medicine you are taking.  Have headaches that keep coming back (recurring).  Feel dizzy.  Have swelling in your ankles.  Have trouble with your vision. Get help right away if you:  Develop a severe headache or confusion.  Have unusual weakness or numbness.  Feel faint.  Have severe pain in your chest or abdomen.  Vomit repeatedly.  Have trouble breathing. Summary  Hypertension is when the force of blood pumping through your arteries is too strong. If this condition is not controlled, it may put you at risk for serious complications.  Your personal target blood pressure may vary depending on your medical conditions, your age, and other factors. For most people, a normal blood pressure is  less than 120/80.  Hypertension is treated with lifestyle changes, medicines, or a combination of both. Lifestyle changes include losing weight, eating a healthy, low-sodium diet, exercising more, and limiting alcohol. This information is not intended to replace advice given to you by your health care provider. Make sure you discuss any questions you have with your health care provider. Document Revised: 03/31/2018 Document Reviewed: 03/31/2018 Elsevier Patient Education  [place].   Hypertension, Adult Hypertension is another name for high blood pressure. High blood pressure forces your heart to work harder to pump blood. This can cause problems over time. There are two numbers in a blood pressure reading. There is a top number (systolic) over a bottom number (diastolic). It is best to have a blood pressure that is below 120/80. Healthy choices can help lower your blood pressure, or you may need medicine to help lower it. What are the causes? The cause of this condition is not known. Some conditions may be related to high blood pressure. What increases the risk?  Smoking.  Having type 2 diabetes mellitus, high cholesterol, or both.  Not getting enough exercise or physical activity.  Being overweight.  Having too much fat, sugar, calories, or salt (sodium) in your diet.  Drinking too much alcohol.  Having long-term (chronic) kidney disease.  Having a family history of high blood pressure.  Age. Risk increases with age.  Race. You may be at higher risk if you are African American.  Gender. Men are at higher risk than women before age 68. After age 32, women are at higher risk than men.  Having obstructive sleep apnea.  Stress. What are the signs or symptoms?  High blood pressure may not cause symptoms. Very high blood pressure (hypertensive crisis) may cause: ? Headache. ? Feelings of worry or nervousness (anxiety). ? Shortness of breath. ? Nosebleed. ? A  feeling of being sick to your stomach (nausea). ? Throwing up (vomiting). ? Changes in how you see. ? Very bad chest pain. ? Seizures. How is this treated?  This condition  is treated by making healthy lifestyle changes, such as: ? Eating healthy foods. ? Exercising more. ? Drinking less alcohol.  Your health care provider may prescribe medicine if lifestyle changes are not enough to get your blood pressure under control, and if: ? Your top number is above 130. ? Your bottom number is above 80.  Your personal target blood pressure may vary. Follow these instructions at home: Eating and drinking   If told, follow the DASH eating plan. To follow this plan: ? Fill one half of your plate at each meal with fruits and vegetables. ? Fill one fourth of your plate at each meal with whole grains. Whole grains include whole-wheat pasta, brown rice, and whole-grain bread. ? Eat or drink low-fat dairy products, such as skim milk or low-fat yogurt. ? Fill one fourth of your plate at each meal with low-fat (lean) proteins. Low-fat proteins include fish, chicken without skin, eggs, beans, and tofu. ? Avoid fatty meat, cured and processed meat, or chicken with skin. ? Avoid pre-made or processed food.  Eat less than 1,500 mg of salt each day.  Do not drink alcohol if: ? Your doctor tells you not to drink. ? You are pregnant, may be pregnant, or are planning to become pregnant.  If you drink alcohol: ? Limit how much you use to:  0-1 drink a day for women.  0-2 drinks a day for men. ? Be aware of how much alcohol is in your drink. In the [place] one drink equals one 12 oz bottle of beer (355 mL), one 5 oz glass of wine (148 mL), or one 1 oz glass of hard liquor (44 mL). Lifestyle   Work with your doctor to stay at a healthy weight or to lose weight. Ask your doctor what the best weight is for you.  Get at least 30 minutes of exercise most days of the week. This may include walking,  swimming, or biking.  Get at least 30 minutes of exercise that strengthens your muscles (resistance exercise) at least 3 days a week. This may include lifting weights or doing Pilates.  Do not use any products that contain nicotine or tobacco, such as cigarettes, e-cigarettes, and chewing tobacco. If you need help quitting, ask your doctor.  Check your blood pressure at home as told by your doctor.  Keep all follow-up visits as told by your doctor. This is important. Medicines  Take over-the-counter and prescription medicines only as told by your doctor. Follow directions carefully.  Do not skip doses of blood pressure medicine. The medicine does not work as well if you skip doses. Skipping doses also puts you at risk for problems.  Ask your doctor about side effects or reactions to medicines that you should watch for. Contact a doctor if you:  Think you are having a reaction to the medicine you are taking.  Have headaches that keep coming back (recurring).  Feel dizzy.  Have swelling in your ankles.  Have trouble with your vision. Get help right away if you:  Get a very bad headache.  Start to feel mixed up (confused).  Feel weak or numb.  Feel faint.  Have very bad pain in your: ? Chest. ? Belly (abdomen).  Throw up more than once.  Have trouble breathing. Summary  Hypertension is another name for high blood pressure.  High blood pressure forces your heart to work harder to pump blood.  For most people, a normal blood pressure is less than 120/80.  Making healthy choices can help lower blood pressure. If your blood pressure does not get lower with healthy choices, you may need to take medicine. This information is not intended to replace advice given to you by your health care provider. Make sure you discuss any questions you have with your health care provider. Document Revised: 03/31/2018 Document Reviewed: 03/31/2018 Elsevier Patient Education  2020  [place] Your Hypertension Hypertension is commonly called high blood pressure. This is when the force of your blood pressing against the walls of your arteries is too strong. Arteries are blood vessels that carry blood from your heart throughout your body. Hypertension forces the heart to work harder to pump blood, and may cause the arteries to become narrow or stiff. Having untreated or uncontrolled hypertension can cause heart attack, stroke, kidney disease, and other problems. What are blood pressure readings? A blood pressure reading consists of a higher number over a lower number. Ideally, your blood pressure should be below 120/80. The first ("top") number is called the systolic pressure. It is a measure of the pressure in your arteries as your heart beats. The second ("bottom") number is called the diastolic pressure. It is a measure of the pressure in your arteries as the heart relaxes. What does my blood pressure reading mean? Blood pressure is classified into four stages. Based on your blood pressure reading, your health care provider may use the following stages to determine what type of treatment you need, if any. Systolic pressure and diastolic pressure are measured in a unit called mm Hg. Normal  Systolic pressure: below 123456.  Diastolic pressure: below 80. Elevated  Systolic pressure: [id number].  Diastolic pressure: below 80. Hypertension stage 1  Systolic pressure: 0000000.  Diastolic pressure: [id number]. Hypertension stage 2  Systolic pressure: [id number] or above.  Diastolic pressure: 90 or above. What health risks are associated with hypertension? Managing your hypertension is an important responsibility. Uncontrolled hypertension can lead to:  A heart attack.  A stroke.  A weakened blood vessel (aneurysm).  Heart failure.  Kidney damage.  Eye damage.  Metabolic syndrome.  Memory and concentration problems. What changes can I make to manage my  hypertension? Hypertension can be managed by making lifestyle changes and possibly by taking medicines. Your health care provider will help you make a plan to bring your blood pressure within a normal range. Eating and drinking   Eat a diet that is high in fiber and potassium, and low in salt (sodium), added sugar, and fat. An example eating plan is called the DASH (Dietary Approaches to Stop Hypertension) diet. To eat this way: ? Eat plenty of fresh fruits and vegetables. Try to fill half of your plate at each meal with fruits and vegetables. ? Eat whole grains, such as whole wheat pasta, brown rice, or whole grain bread. Fill about one quarter of your plate with whole grains. ? Eat low-fat diary products. ? Avoid fatty cuts of meat, processed or cured meats, and poultry with skin. Fill about one quarter of your plate with lean proteins such as fish, chicken without skin, beans, eggs, and tofu. ? Avoid premade and processed foods. These tend to be higher in sodium, added sugar, and fat.  Reduce your daily sodium intake. Most people with hypertension should eat less than 1,500 mg of sodium a day.  Limit alcohol intake to no more than 1 drink a day for nonpregnant women and 2 drinks a day for men. One drink equals 12 oz  of beer, 5 oz of wine, or 1 oz of hard liquor. Lifestyle  Work with your health care provider to maintain a healthy body weight, or to lose weight. Ask what an ideal weight is for you.  Get at least 30 minutes of exercise that causes your heart to beat faster (aerobic exercise) most days of the week. Activities may include walking, swimming, or biking.  Include exercise to strengthen your muscles (resistance exercise), such as weight lifting, as part of your weekly exercise routine. Try to do these types of exercises for 30 minutes at least 3 days a week.  Do not use any products that contain nicotine or tobacco, such as cigarettes and e-cigarettes. If you need help quitting,  ask your health care provider.  Control any long-term (chronic) conditions you have, such as high cholesterol or diabetes. Monitoring  Monitor your blood pressure at home as told by your health care provider. Your personal target blood pressure may vary depending on your medical conditions, your age, and other factors.  Have your blood pressure checked regularly, as often as told by your health care provider. Working with your health care provider  Review all the medicines you take with your health care provider because there may be side effects or interactions.  Talk with your health care provider about your diet, exercise habits, and other lifestyle factors that may be contributing to hypertension.  Visit your health care provider regularly. Your health care provider can help you create and adjust your plan for managing hypertension. Will I need medicine to control my blood pressure? Your health care provider may prescribe medicine if lifestyle changes are not enough to get your blood pressure under control, and if:  Your systolic blood pressure is 130 or higher.  Your diastolic blood pressure is 80 or higher. Take medicines only as told by your health care provider. Follow the directions carefully. Blood pressure medicines must be taken as prescribed. The medicine does not work as well when you skip doses. Skipping doses also puts you at risk for problems. Contact a health care provider if:  You think you are having a reaction to medicines you have taken.  You have repeated (recurrent) headaches.  You feel dizzy.  You have swelling in your ankles.  You have trouble with your vision. Get help right away if:  You develop a severe headache or confusion.  You have unusual weakness or numbness, or you feel faint.  You have severe pain in your chest or abdomen.  You vomit repeatedly.  You have trouble breathing. Summary  Hypertension is when the force of blood pumping  through your arteries is too strong. If this condition is not controlled, it may put you at risk for serious complications.  Your personal target blood pressure may vary depending on your medical conditions, your age, and other factors. For most people, a normal blood pressure is less than 120/80.  Hypertension is managed by lifestyle changes, medicines, or both. Lifestyle changes include weight loss, eating a healthy, low-sodium diet, exercising more, and limiting alcohol. This information is not intended to replace advice given to you by your health care provider. Make sure you discuss any questions you have with your health care provider. Document Revised: 11/12/2018 Document Reviewed: 06/18/2016 Elsevier Patient Education  [place] DASH stands for "Dietary Approaches to Stop Hypertension." The DASH eating plan is a healthy eating plan that has been shown to reduce high blood pressure (hypertension). It  may also reduce your risk for type 2 diabetes, heart disease, and stroke. The DASH eating plan may also help with weight loss. What are tips for following this plan?  General guidelines  Avoid eating more than 2,300 mg (milligrams) of salt (sodium) a day. If you have hypertension, you may need to reduce your sodium intake to 1,500 mg a day.  Limit alcohol intake to no more than 1 drink a day for nonpregnant women and 2 drinks a day for men. One drink equals 12 oz of beer, 5 oz of wine, or 1 oz of hard liquor.  Work with your health care provider to maintain a healthy body weight or to lose weight. Ask what an ideal weight is for you.  Get at least 30 minutes of exercise that causes your heart to beat faster (aerobic exercise) most days of the week. Activities may include walking, swimming, or biking.  Work with your health care provider or diet and nutrition specialist (dietitian) to adjust your eating plan to your individual calorie needs. Reading food  labels   Check food labels for the amount of sodium per serving. Choose foods with less than 5 percent of the Daily Value of sodium. Generally, foods with less than 300 mg of sodium per serving fit into this eating plan.  To find whole grains, look for the word "whole" as the first word in the ingredient list. Shopping  Buy products labeled as "low-sodium" or "no salt added."  Buy fresh foods. Avoid canned foods and premade or frozen meals. Cooking  Avoid adding salt when cooking. Use salt-free seasonings or herbs instead of table salt or sea salt. Check with your health care provider or pharmacist before using salt substitutes.  Do not fry foods. Cook foods using healthy methods such as baking, boiling, grilling, and broiling instead.  Cook with heart-healthy oils, such as olive, canola, soybean, or sunflower oil. Meal planning  Eat a balanced diet that includes: ? 5 or more servings of fruits and vegetables each day. At each meal, try to fill half of your plate with fruits and vegetables. ? Up to 6-8 servings of whole grains each day. ? Less than 6 oz of lean meat, poultry, or fish each day. A 3-oz serving of meat is about the same size as a deck of cards. One egg equals 1 oz. ? 2 servings of low-fat dairy each day. ? A serving of nuts, seeds, or beans 5 times each week. ? Heart-healthy fats. Healthy fats called Omega-3 fatty acids are found in foods such as flaxseeds and coldwater fish, like sardines, salmon, and mackerel.  Limit how much you eat of the following: ? Canned or prepackaged foods. ? Food that is high in trans fat, such as fried foods. ? Food that is high in saturated fat, such as fatty meat. ? Sweets, desserts, sugary drinks, and other foods with added sugar. ? Full-fat dairy products.  Do not salt foods before eating.  Try to eat at least 2 vegetarian meals each week.  Eat more home-cooked food and less restaurant, buffet, and fast food.  When eating at a  restaurant, ask that your food be prepared with less salt or no salt, if possible. What foods are recommended? The items listed may not be a complete list. Talk with your dietitian about what dietary choices are best for you. Grains Whole-grain or whole-wheat bread. Whole-grain or whole-wheat pasta. Brown rice. [place]. Bulgur. Whole-grain and low-sodium cereals. Pita bread. Low-fat, low-sodium crackers.  Whole-wheat flour tortillas. Vegetables Fresh or frozen vegetables (raw, steamed, roasted, or grilled). Low-sodium or reduced-sodium tomato and vegetable juice. Low-sodium or reduced-sodium tomato sauce and tomato paste. Low-sodium or reduced-sodium canned vegetables. Fruits All fresh, dried, or frozen fruit. Canned fruit in natural juice (without added sugar). Meat and other protein foods Skinless chicken or [place]. Ground chicken or [place]. Pork with fat trimmed off. Fish and seafood. Egg whites. Dried beans, peas, or lentils. Unsalted nuts, nut butters, and seeds. Unsalted canned beans. Lean cuts of beef with fat trimmed off. Low-sodium, lean deli meat. Dairy Low-fat (1%) or fat-free (skim) milk. Fat-free, low-fat, or reduced-fat cheeses. Nonfat, low-sodium ricotta or cottage cheese. Low-fat or nonfat yogurt. Low-fat, low-sodium cheese. Fats and oils Soft margarine without trans fats. Vegetable oil. Low-fat, reduced-fat, or light mayonnaise and salad dressings (reduced-sodium). Canola, safflower, olive, soybean, and sunflower oils. Avocado. Seasoning and other foods Herbs. Spices. Seasoning mixes without salt. Unsalted popcorn and pretzels. Fat-free sweets. What foods are not recommended? The items listed may not be a complete list. Talk with your dietitian about what dietary choices are best for you. Grains Baked goods made with fat, such as croissants, muffins, or some breads. Dry pasta or rice meal packs. Vegetables Creamed or fried vegetables. Vegetables in a cheese sauce.  Regular canned vegetables (not low-sodium or reduced-sodium). Regular canned tomato sauce and paste (not low-sodium or reduced-sodium). Regular tomato and vegetable juice (not low-sodium or reduced-sodium). Angie Fava. Olives. Fruits Canned fruit in a light or heavy syrup. Fried fruit. Fruit in cream or butter sauce. Meat and other protein foods Fatty cuts of meat. Ribs. Fried meat. Berniece Salines. Sausage. Bologna and other processed lunch meats. Salami. Fatback. Hotdogs. Bratwurst. Salted nuts and seeds. Canned beans with added salt. Canned or smoked fish. Whole eggs or egg yolks. Chicken or [place] with skin. Dairy Whole or 2% milk, cream, and half-and-half. Whole or full-fat cream cheese. Whole-fat or sweetened yogurt. Full-fat cheese. Nondairy creamers. Whipped toppings. Processed cheese and cheese spreads. Fats and oils Butter. Stick margarine. Lard. Shortening. Ghee. Bacon fat. Tropical oils, such as coconut, palm kernel, or palm oil. Seasoning and other foods Salted popcorn and pretzels. Onion salt, garlic salt, seasoned salt, table salt, and sea salt. Worcestershire sauce. Tartar sauce. Barbecue sauce. Teriyaki sauce. Soy sauce, including reduced-sodium. Steak sauce. Canned and packaged gravies. Fish sauce. Oyster sauce. Cocktail sauce. Horseradish that you find on the shelf. Ketchup. Mustard. Meat flavorings and tenderizers. Bouillon cubes. Hot sauce and Tabasco sauce. Premade or packaged marinades. Premade or packaged taco seasonings. Relishes. Regular salad dressings. Where to find more information:  National Heart, Lung, and [place]: https://wilson-eaton.com/  American Heart Association: www.heart.org Summary  The DASH eating plan is a healthy eating plan that has been shown to reduce high blood pressure (hypertension). It may also reduce your risk for type 2 diabetes, heart disease, and stroke.  With the DASH eating plan, you should limit salt (sodium) intake to 2,300 mg a day. If you have  hypertension, you may need to reduce your sodium intake to 1,500 mg a day.  When on the DASH eating plan, aim to eat more fresh fruits and vegetables, whole grains, lean proteins, low-fat dairy, and heart-healthy fats.  Work with your health care provider or diet and nutrition specialist (dietitian) to adjust your eating plan to your individual calorie needs. This information is not intended to replace advice given to you by your health care provider. Make sure you discuss any questions you have with your health  care provider. Document Revised: 07/03/2017 Document Reviewed: 07/14/2016 Elsevier Patient Education  [place].   Low-Sodium Eating Plan Sodium, which is an element that makes up salt, helps you maintain a healthy balance of fluids in your body. Too much sodium can increase your blood pressure and cause fluid and waste to be held in your body. Your health care provider or dietitian may recommend following this plan if you have high blood pressure (hypertension), kidney disease, liver disease, or heart failure. Eating less sodium can help lower your blood pressure, reduce swelling, and protect your heart, liver, and kidneys. What are tips for following this plan? General guidelines  Most people on this plan should limit their sodium intake to 1,500-2,000 mg (milligrams) of sodium each day. Reading food labels   The Nutrition Facts label lists the amount of sodium in one serving of the food. If you eat more than one serving, you must multiply the listed amount of sodium by the number of servings.  Choose foods with less than 140 mg of sodium per serving.  Avoid foods with 300 mg of sodium or more per serving. Shopping  Look for lower-sodium products, often labeled as "low-sodium" or "no salt added."  Always check the sodium content even if foods are labeled as "unsalted" or "no salt added".  Buy fresh foods. ? Avoid canned foods and premade or frozen meals. ? Avoid  canned, cured, or processed meats  Buy breads that have less than 80 mg of sodium per slice. Cooking  Eat more home-cooked food and less restaurant, buffet, and fast food.  Avoid adding salt when cooking. Use salt-free seasonings or herbs instead of table salt or sea salt. Check with your health care provider or pharmacist before using salt substitutes.  Cook with plant-based oils, such as canola, sunflower, or olive oil. Meal planning  When eating at a restaurant, ask that your food be prepared with less salt or no salt, if possible.  Avoid foods that contain MSG (monosodium glutamate). MSG is sometimes added to [place] food, bouillon, and some canned foods. What foods are recommended? The items listed may not be a complete list. Talk with your dietitian about what dietary choices are best for you. Grains Low-sodium cereals, including oats, puffed wheat and rice, and shredded wheat. Low-sodium crackers. Unsalted rice. Unsalted pasta. Low-sodium bread. Whole-grain breads and whole-grain pasta. Vegetables Fresh or frozen vegetables. "No salt added" canned vegetables. "No salt added" tomato sauce and paste. Low-sodium or reduced-sodium tomato and vegetable juice. Fruits Fresh, frozen, or canned fruit. Fruit juice. Meats and other protein foods Fresh or frozen (no salt added) meat, poultry, seafood, and fish. Low-sodium canned tuna and salmon. Unsalted nuts. Dried peas, beans, and lentils without added salt. Unsalted canned beans. Eggs. Unsalted nut butters. Dairy Milk. Soy milk. Cheese that is naturally low in sodium, such as ricotta cheese, fresh mozzarella, or Swiss cheese Low-sodium or reduced-sodium cheese. Cream cheese. Yogurt. Fats and oils Unsalted butter. Unsalted margarine with no trans fat. Vegetable oils such as canola or olive oils. Seasonings and other foods Fresh and dried herbs and spices. Salt-free seasonings. Low-sodium mustard and ketchup. Sodium-free salad dressing.  Sodium-free light mayonnaise. Fresh or refrigerated horseradish. Lemon juice. Vinegar. Homemade, reduced-sodium, or low-sodium soups. Unsalted popcorn and pretzels. Low-salt or salt-free chips. What foods are not recommended? The items listed may not be a complete list. Talk with your dietitian about what dietary choices are best for you. Grains Instant hot cereals. Bread stuffing, pancake, and biscuit  mixes. Croutons. Seasoned rice or pasta mixes. Noodle soup cups. Boxed or frozen macaroni and cheese. Regular salted crackers. Self-rising flour. Vegetables Sauerkraut, pickled vegetables, and relishes. Olives. [place] fries. Onion rings. Regular canned vegetables (not low-sodium or reduced-sodium). Regular canned tomato sauce and paste (not low-sodium or reduced-sodium). Regular tomato and vegetable juice (not low-sodium or reduced-sodium). Frozen vegetables in sauces. Meats and other protein foods Meat or fish that is salted, canned, smoked, spiced, or pickled. Bacon, ham, sausage, hotdogs, corned beef, chipped beef, packaged lunch meats, salt pork, jerky, pickled herring, anchovies, regular canned tuna, sardines, salted nuts. Dairy Processed cheese and cheese spreads. Cheese curds. Blue cheese. Feta cheese. String cheese. Regular cottage cheese. Buttermilk. Canned milk. Fats and oils Salted butter. Regular margarine. Ghee. Bacon fat. Seasonings and other foods Onion salt, garlic salt, seasoned salt, table salt, and sea salt. Canned and packaged gravies. Worcestershire sauce. Tartar sauce. Barbecue sauce. Teriyaki sauce. Soy sauce, including reduced-sodium. Steak sauce. Fish sauce. Oyster sauce. Cocktail sauce. Horseradish that you find on the shelf. Regular ketchup and mustard. Meat flavorings and tenderizers. Bouillon cubes. Hot sauce and Tabasco sauce. Premade or packaged marinades. Premade or packaged taco seasonings. Relishes. Regular salad dressings. Salsa. Potato and tortilla chips. Corn chips  and puffs. Salted popcorn and pretzels. Canned or dried soups. Pizza. Frozen entrees and pot pies. Summary  Eating less sodium can help lower your blood pressure, reduce swelling, and protect your heart, liver, and kidneys.  Most people on this plan should limit their sodium intake to 1,500-2,000 mg (milligrams) of sodium each day.  Canned, boxed, and frozen foods are high in sodium. Restaurant foods, fast foods, and pizza are also very high in sodium. You also get sodium by adding salt to food.  Try to cook at home, eat more fresh fruits and vegetables, and eat less fast food, canned, processed, or prepared foods. This information is not intended to replace advice given to you by your health care provider. Make sure you discuss any questions you have with your health care provider. Document Revised: 07/03/2017 Document Reviewed: 07/14/2016 Elsevier Patient Education  2020 [employer].     Why follow it? Research shows. . Those who follow the Mediterranean diet have a reduced risk of heart disease  . The diet is associated with a reduced incidence of Parkinson's and Alzheimer's diseases . People following the diet may have longer life expectancies and lower rates of chronic diseases  . The Dietary Guidelines for Americans recommends the Mediterranean diet as an eating plan to promote health and prevent disease  What Is the Mediterranean Diet?  . Healthy eating plan based on typical foods and recipes of Mediterranean-style cooking . The diet is primarily a plant based diet; these foods should make up a majority of meals   Starches - Plant based foods should make up a majority of meals - They are an important sources of vitamins, minerals, energy, antioxidants, and fiber - Choose whole grains, foods high in fiber and minimally processed items  - Typical grain sources include wheat, oats, barley, corn, brown rice, bulgar, farro, millet, polenta, couscous  - Various types of beans include  chickpeas, lentils, fava beans, black beans, white beans   Fruits  Veggies - Large quantities of antioxidant rich fruits & veggies; 6 or more servings  - Vegetables can be eaten raw or lightly drizzled with oil and cooked  - Vegetables common to the traditional Mediterranean Diet include: artichokes, arugula, beets, broccoli, brussel sprouts, cabbage, carrots, celery, collard  greens, cucumbers, eggplant, kale, leeks, lemons, lettuce, mushrooms, okra, onions, peas, peppers, potatoes, pumpkin, radishes, rutabaga, shallots, spinach, sweet potatoes, turnips, zucchini - Fruits common to the Mediterranean Diet include: apples, apricots, avocados, cherries, clementines, dates, figs, grapefruits, grapes, melons, nectarines, oranges, peaches, pears, pomegranates, strawberries, tangerines  Fats - Replace butter and margarine with healthy oils, such as olive oil, canola oil, and tahini  - Limit nuts to no more than a handful a day  - Nuts include walnuts, almonds, pecans, pistachios, pine nuts  - Limit or avoid candied, honey roasted or heavily salted nuts - Olives are central to the [employer] - can be eaten whole or used in a variety of dishes   Meats Protein - Limiting red meat: no more than a few times a month - When eating red meat: choose lean cuts and keep the portion to the size of deck of cards - Eggs: approx. 0 to 4 times a week  - Fish and lean poultry: at least 2 a week  - Healthy protein sources include, chicken, [place], lean beef, lamb - Increase intake of seafood such as tuna, salmon, trout, mackerel, shrimp, scallops - Avoid or limit high fat processed meats such as sausage and bacon  Dairy - Include moderate amounts of low fat dairy products  - Focus on healthy dairy such as fat free yogurt, skim milk, low or reduced fat cheese - Limit dairy products higher in fat such as whole or 2% milk, cheese, ice cream  Alcohol - Moderate amounts of red wine is ok  - No more than 5 oz daily  for women (all ages) and men older than age 47  - No more than 10 oz of wine daily for men younger than 9  Other - Limit sweets and other desserts  - Use herbs and spices instead of salt to flavor foods  - Herbs and spices common to the traditional Mediterranean Diet include: basil, bay leaves, chives, cloves, cumin, fennel, garlic, lavender, marjoram, mint, oregano, parsley, pepper, rosemary, sage, savory, sumac, tarragon, thyme   It's not just a diet, it's a lifestyle:  . The Mediterranean diet includes lifestyle factors typical of those in the region  . Foods, drinks and meals are best eaten with others and savored . Daily physical activity is important for overall good health . This could be strenuous exercise like running and aerobics . This could also be more leisurely activities such as walking, housework, yard-work, or taking the stairs . Moderation is the key; a balanced and healthy diet accommodates most foods and drinks . Consider portion sizes and frequency of consumption of certain foods   Meal Ideas & Options:  . Breakfast:  o Whole wheat toast or whole wheat English muffins with peanut butter & hard boiled egg o Steel cut oats topped with apples & cinnamon and skim milk  o Fresh fruit: banana, strawberries, melon, berries, peaches  o Smoothies: strawberries, bananas, greek yogurt, peanut butter o Low fat greek yogurt with blueberries and granola  o Egg white omelet with spinach and mushrooms o Breakfast couscous: whole wheat couscous, apricots, skim milk, cranberries  . Sandwiches:  o Hummus and grilled vegetables (peppers, zucchini, squash) on whole wheat bread   o Grilled chicken on whole wheat pita with lettuce, tomatoes, cucumbers or tzatziki  o Tuna salad on whole wheat bread: tuna salad made with greek yogurt, olives, red peppers, capers, green onions o Garlic rosemary lamb pita: lamb sauted with garlic, rosemary, salt & pepper;  add lettuce, cucumber, greek yogurt  to pita - flavor with lemon juice and black pepper  . Seafood:  o Mediterranean grilled salmon, seasoned with garlic, basil, parsley, lemon juice and black pepper o Shrimp, lemon, and spinach whole-grain pasta salad made with low fat greek yogurt  o Seared scallops with lemon orzo  o Seared tuna steaks seasoned salt, pepper, coriander topped with tomato mixture of olives, tomatoes, olive oil, minced garlic, parsley, green onions and cappers  . Meats:  o Herbed greek chicken salad with kalamata olives, cucumber, feta  o Red bell peppers stuffed with spinach, bulgur, lean ground beef (or lentils) & topped with feta   o Kebabs: skewers of chicken, tomatoes, onions, zucchini, squash  o [place] burgers: made with red onions, mint, dill, lemon juice, feta cheese topped with roasted red peppers . Vegetarian o Cucumber salad: cucumbers, artichoke hearts, celery, red onion, feta cheese, tossed in olive oil & lemon juice  o Hummus and whole grain pita points with a greek salad (lettuce, tomato, feta, olives, cucumbers, red onion) o Lentil soup with celery, carrots made with vegetable broth, garlic, salt and pepper  o Tabouli salad: parsley, bulgur, mint, scallions, cucumbers, tomato, radishes, lemon juice, olive oil, salt and pepper.

## 2019-09-15 ENCOUNTER — Encounter: Payer: Self-pay | Admitting: Adult Health

## 2019-09-15 LAB — PSA: Prostate Specific Ag, Serum: 1 ng/mL (ref 0.0–4.0)

## 2019-09-15 NOTE — Progress Notes (Signed)
PSA is normal. I also placed referral to Dr. Ren[facility] for management of hypothyroidism as well as testosterone therapy.

## 2019-09-27 ENCOUNTER — Other Ambulatory Visit: Payer: Self-pay

## 2019-09-27 ENCOUNTER — Ambulatory Visit
Admission: RE | Admit: 2019-09-27 | Discharge: 2019-09-27 | Disposition: A | Discharge status: Home / Self Care | Payer: Medicare Other | Source: Ambulatory Visit | Attending: Neurosurgery | Admitting: Neurosurgery

## 2019-09-27 DIAGNOSIS — M4722 Other spondylosis with radiculopathy, cervical region: Secondary | ICD-10-CM | POA: Insufficient documentation

## 2019-09-27 DIAGNOSIS — R200 Anesthesia of skin: Secondary | ICD-10-CM | POA: Diagnosis not present

## 2019-09-27 DIAGNOSIS — M4802 Spinal stenosis, cervical region: Secondary | ICD-10-CM | POA: Diagnosis not present

## 2019-09-28 DIAGNOSIS — M4722 Other spondylosis with radiculopathy, cervical region: Secondary | ICD-10-CM | POA: Diagnosis not present

## 2019-09-28 DIAGNOSIS — M47812 Spondylosis without myelopathy or radiculopathy, cervical region: Secondary | ICD-10-CM | POA: Diagnosis not present

## 2019-10-06 DIAGNOSIS — L570 Actinic keratosis: Secondary | ICD-10-CM | POA: Diagnosis not present

## 2019-10-06 DIAGNOSIS — L578 Other skin changes due to chronic exposure to nonionizing radiation: Secondary | ICD-10-CM | POA: Diagnosis not present

## 2019-10-11 DIAGNOSIS — M5412 Radiculopathy, cervical region: Secondary | ICD-10-CM | POA: Diagnosis not present

## 2019-10-11 DIAGNOSIS — Z981 Arthrodesis status: Secondary | ICD-10-CM | POA: Diagnosis not present

## 2019-10-12 ENCOUNTER — Other Ambulatory Visit: Payer: Self-pay

## 2019-10-12 ENCOUNTER — Encounter: Payer: Self-pay | Admitting: Adult Health

## 2019-10-12 ENCOUNTER — Ambulatory Visit (INDEPENDENT_AMBULATORY_CARE_PROVIDER_SITE_OTHER): Payer: Medicare Other | Admitting: Adult Health

## 2019-10-12 VITALS — BP 128/98 | HR 77 | Temp 97.6°F | Resp 16 | Wt 204.0 lb

## 2019-10-12 DIAGNOSIS — I10 Essential (primary) hypertension: Secondary | ICD-10-CM | POA: Diagnosis not present

## 2019-10-12 DIAGNOSIS — Z77011 Contact with and (suspected) exposure to lead: Secondary | ICD-10-CM | POA: Diagnosis not present

## 2019-10-12 DIAGNOSIS — G4700 Insomnia, unspecified: Secondary | ICD-10-CM

## 2019-10-12 DIAGNOSIS — R7871 Abnormal lead level in blood: Secondary | ICD-10-CM | POA: Diagnosis not present

## 2019-10-12 DIAGNOSIS — Z79890 Hormone replacement therapy (postmenopausal): Secondary | ICD-10-CM

## 2019-10-12 DIAGNOSIS — F411 Generalized anxiety disorder: Secondary | ICD-10-CM

## 2019-10-12 DIAGNOSIS — E039 Hypothyroidism, unspecified: Secondary | ICD-10-CM | POA: Diagnosis not present

## 2019-10-12 MED ORDER — ALPRAZOLAM 0.5 MG PO TABS: 0.2500 mg | ORAL_TABLET | Freq: Every evening | ORAL | 0 refills | Status: DC | PRN

## 2019-10-12 MED ORDER — LOSARTAN POTASSIUM 50 MG PO TABS: 25.0000 mg | ORAL_TABLET | Freq: Every day | ORAL | 0 refills | Status: DC

## 2019-10-12 NOTE — Progress Notes (Signed)
Patient: Timothy Powell Male    DOB: 1976-01-23   44 y.o.   MRN: [id number] Visit Date: 10/12/2019  Today's Provider: Marcille Buffy, FNP   Chief Complaint  Patient presents with  . Hypertension   Subjective:     HPI  Hypertension, follow-up:  BP Readings from Last 3 Encounters:  10/12/19 (!) 128/98  09/14/19 (!) 138/94  08/12/19 (!) 150/105    He was last seen for hypertension 1 months ago.  BP at that visit was 138/94 . Management changes since that visit include starting patient on HCTZ 12.5mg . He reports poor compliance with treatment. He is having side effects. Patient reports that he d/c medication two weeks ago because he was experiencing cramping in his extremities. He reports he did not want to take being out in the sun.  He is not exercising. He is adherent to low salt diet.   Outside blood pressures are being checked periodically, recent blood pressure reading at home of 130/90. He is experiencing none.  Patient denies chest pain, chest pressure/discomfort, claudication, dyspnea, exertional chest pressure/discomfort, fatigue, irregular heart beat, lower extremity edema, near-syncope, orthopnea, palpitations, paroxysmal nocturnal dyspnea, syncope and tachypnea.   Cardiovascular risk factors include hypertension and male gender.  Use of agents associated with hypertension: none.    At home he is getting readings of 140/90.  He had reading of 140/105 at neurosurgeon last week.  He is increasing his exercise and watching his diet. He had just ran up the stairs, his  caffeine is one red bull a day.His work outs are light due to neck.  Blood pressure has improved since initial visit, he has neck pain and stiffness as well as untreated hypothyroidism that could all be contributing.  He had decreased libido on the HCTZ and he was also stressed he reports.   Weight trend: stable Wt Readings from Last 3 Encounters:  10/12/19 204 lb (92.5 kg)  09/14/19 206  lb 12.8 oz (93.8 kg)  08/12/19 200 lb (90.7 kg)    Current diet: trying to watch salt.   He is doing gel testosterone gel and stopped his testosterone injections and decreased his dose since our last visit.  He has an appointment with Dr. Loanne Drilling. Endocrinology He also has untreated hypothyroidism and does not want to be treated.   He is under a lot of stress with his job.  He has no other cardiac or respiratory symptoms reported.  Patient  denies any fever, body aches,chills, rash, chest pain, shortness of breath, nausea, vomiting, or diarrhea.   He needs refills on xanax. He reports still it is the only thing that helps him sleep.    ------------------------------------------------------------------------  No Known Allergies   Current Outpatient Medications:  .  ALPRAZolam (XANAX) 0.5 MG tablet, Take 0.5 tablets (0.25 mg total) by mouth at bedtime as needed for anxiety., Disp: 15 tablet, Rfl: 0 .  magnesium oxide (MAG-OX) 400 MG tablet, Take 400 mg by mouth daily., Disp: , Rfl:  .  Misc Natural Products (GLUCOSAMINE CHOND COMPLEX/MSM) TABS, Take 2 tablets by mouth daily., Disp: , Rfl:  .  Multiple Vitamin (MULTI-VITAMINS) TABS, Take 2 tablets by mouth daily. GNC MEGA MEN, Disp: , Rfl:  .  Testosterone 10 MG/ACT (2%) GEL, Place onto the skin., Disp: , Rfl:  .  testosterone cypionate (DEPOTESTOTERONE CYPIONATE) 100 MG/ML injection, Inject 100 mg into the muscle every 14 (fourteen) days. For IM use only, Disp: , Rfl:  .  traMADol (ULTRAM) 50 MG tablet, Take 50 mg by mouth every 6 (six) hours as needed (pain)., Disp: , Rfl:  .  valACYclovir (VALTREX) 500 MG tablet, TAKE 2 TABLETS STAT AND THEN 1 TABLET EVERY 12 HOURS FOR 5 DAYS., Disp: , Rfl:  .  hydrochlorothiazide (HYDRODIURIL) 12.5 MG tablet, Take 1 tablet (12.5 mg total) by mouth daily. (Patient not taking: Reported on 10/12/2019), Disp: 30 tablet, Rfl: 0  Review of Systems  Constitutional: Positive for activity change (related to  his cervical surgery, less active than he previously was, however he is still doing low intensity work outs. he reports beinig very active all his life. ) and fatigue. Negative for appetite change, chills, diaphoresis, fever and unexpected weight change.  HENT: Negative.   Eyes: Negative.   Respiratory: Negative.  Negative for apnea, cough, choking, chest tightness, shortness of breath, wheezing and stridor.   Cardiovascular: Negative.  Negative for chest pain, palpitations and leg swelling.  Endocrine: Negative for cold intolerance, heat intolerance, polydipsia, polyphagia and polyuria.  Musculoskeletal: Positive for arthralgias and neck stiffness (history of neck surgery and keeping neurosurgeon folllow up's this has improved since surgery. ).  Skin: Negative.   Neurological: Negative for dizziness, speech difficulty, light-headedness and headaches.  Psychiatric/Behavioral: Positive for sleep disturbance. Negative for agitation, behavioral problems, confusion, decreased concentration and suicidal ideas. The patient is nervous/anxious.     Social History   Tobacco Use  . Smoking status: Never Smoker  . Smokeless tobacco: Never Used  Substance Use Topics  . Alcohol use: Yes    Alcohol/week: 0.0 standard drinks    Comment: social      Objective:  Blood pressure (!) 128/98, pulse 77, temperature 97.6 F (36.4 C), temperature source Temporal, resp. rate 16, weight 204 lb (92.5 kg), SpO2 98 %.   Physical Exam Vitals reviewed.  Constitutional:      General: He is not in acute distress.    Appearance: Normal appearance. He is not ill-appearing, toxic-appearing or diaphoretic.  HENT:     Head: Normocephalic and atraumatic.     Right Ear: External ear normal.     Left Ear: External ear normal.     Nose: Nose normal.     Mouth/Throat:     Mouth: Mucous membranes are moist.  Eyes:     Extraocular Movements: Extraocular movements intact.     Conjunctiva/sclera: Conjunctivae normal.      Pupils: Pupils are equal, round, and reactive to light.  Neck:     Vascular: No carotid bruit.  Cardiovascular:     Rate and Rhythm: Normal rate and regular rhythm.     Pulses: Normal pulses.     Heart sounds: Normal heart sounds. No murmur. No friction rub. No gallop.   Pulmonary:     Effort: Pulmonary effort is normal. No respiratory distress.     Breath sounds: Normal breath sounds. No stridor. No wheezing, rhonchi or rales.  Chest:     Chest wall: No tenderness.  Abdominal:     Palpations: Abdomen is soft.  Musculoskeletal:     Cervical back: Normal range of motion and neck supple. No rigidity or tenderness.  Lymphadenopathy:     Cervical: No cervical adenopathy.  Skin:    General: Skin is warm and dry.     Capillary Refill: Capillary refill takes less than 2 seconds.  Neurological:     Mental Status: He is alert and oriented to person, place, and time.     Gait: Gait normal.  Psychiatric:        Mood and Affect: Mood normal.        Behavior: Behavior normal.        Thought Content: Thought content normal.        Judgment: Judgment normal.      No results found for any visits on 10/12/19.     Assessment & Plan    1. Hypertension, unspecified type He has continued to have elevated blood pressure his whole life that has been untreated, he reports the HCTZ gave him aches ands suspected decreased libido. He will start Cozaar 25 mg daily by mouth, manage diet and lifestyle changes with heart healthy diet.  He is aware of how to take the medication and side effects risks discussed.  He will bring a log of his blood pressures to next visit and notify office if he has to discontinue this medication. Untreated hypertension and risks discussed including LVH, cardiac and respiratory issues discusses.Discussed his blood pressure readings and national guidelines for hypertension management, also discussed hypotension as well as hypertension with treatment and when to seek care or  call office.  Patient verbalized understanding of all instructions given and denies any further questions at this time.  Patient is in agreement to try this medication and will le me know how it is working.   Meds ordered this encounter  Medications  . losartan (COZAAR) 50 MG tablet    Sig: Take 0.5 tablets (25 mg total) by mouth daily.    Dispense:  90 tablet    Refill:  0  . ALPRAZolam (XANAX) 0.5 MG tablet    Sig: Take 0.5 tablets (0.25 mg total) by mouth at bedtime as needed for anxiety.    Dispense:  30 tablet    Refill:  0    2. Insomnia, unspecified type  He takes 0.25 mg ativan once by mouth  at bedtime for sleep, he has taken other medications in the past and reports this is the only thing that works for him to sleep. He is aware of addictive risks and side effects, as well as not driving, making legal decisions etc while on this medications. Take only as prescribed and only as needed.  Meds ordered this encounter  Medications  . losartan (COZAAR) 50 MG tablet    Sig: Take 0.5 tablets (25 mg total) by mouth daily.    Dispense:  90 tablet    Refill:  0  . ALPRAZolam (XANAX) 0.5 MG tablet    Sig: Take 0.5 tablets (0.25 mg total) by mouth at bedtime as needed for anxiety.    Dispense:  30 tablet    Refill:  0     3. Generalized anxiety disorder He has mild anxiety that mostly keeps him from sleeping, he reports xanax 0.25mg  helps him relax enough to sleep. Long term use risks discussed as well as side effects as discussed see above.   4. Long-term current use of testosterone replacement therapy He has not seen endocrinology, last visit testosterone lab was elevated, he has since lowered his dose and he has an appointment with Dr. Loanne Drilling for further work up and treatment as we do not manage testosterone in this office. Discussed risks versus benefits of testosterone therapy.  PSA was 09/14/2019 and was normal. Denies any rectal pain or pressure.    5. Hypothyroidism- Not  treated patient declines medication treatment due to past intolerance to medication. He is aware of risks of untreated hypothyroidism and agrees to go to  another endocrinologist as he did not want to return where he was seen prior.  Patient declines Synthroid, he has had untreated hypothyroidism for years. He has seen endocrinology in the past and he did not like the side effects from the medication. He has an appointment with Dr. Loanne Drilling at endocrinology to discuss on 10/15/2019.   An After Visit Summary was printed and given to the patient.  Advised patient call the office or your primary care doctor for an appointment if no improvement within 72 hours or if any symptoms change or worsen at any time  Advised ER or urgent Care if after hours or on weekend. Call 911 for emergency symptoms at any time.Patinet verbalized understanding of all instructions given/reviewed and treatment plan and has no further questions or concerns at this time.      The entirety of the information documented in the History of Present Illness, Review of Systems and Physical Exam were personally obtained by me. Portions of this information were initially documented by the  Certified Medical Assistant whose name is documented in [place] and reviewed by me for thoroughness and accuracy.  I have personally performed the exam and reviewed the chart and it is accurate to the best of my knowledge.  Haematologist has been used and any errors in dictation or transcription are unintentional.  Kelby Aline. [place], [facility]

## 2019-10-12 NOTE — Patient Instructions (Signed)
Hypertension, Adult High blood pressure (hypertension) is when the force of blood pumping through the arteries is too strong. The arteries are the blood vessels that carry blood from the heart throughout the body. Hypertension forces the heart to work harder to pump blood and may cause arteries to become narrow or stiff. Untreated or uncontrolled hypertension can cause a heart attack, heart failure, a stroke, kidney disease, and other problems. A blood pressure reading consists of a higher number over a lower number. Ideally, your blood pressure should be below 120/80. The first ("top") number is called the systolic pressure. It is a measure of the pressure in your arteries as your heart beats. The second ("bottom") number is called the diastolic pressure. It is a measure of the pressure in your arteries as the heart relaxes. What are the causes? The exact cause of this condition is not known. There are some conditions that result in or are related to high blood pressure. What increases the risk? Some risk factors for high blood pressure are under your control. The following factors may make you more likely to develop this condition:  Smoking.  Having type 2 diabetes mellitus, high cholesterol, or both.  Not getting enough exercise or physical activity.  Being overweight.  Having too much fat, sugar, calories, or salt (sodium) in your diet.  Drinking too much alcohol. Some risk factors for high blood pressure may be difficult or impossible to change. Some of these factors include:  Having chronic kidney disease.  Having a family history of high blood pressure.  Age. Risk increases with age.  Race. You may be at higher risk if you are African American.  Gender. Men are at higher risk than women before age 42. After age 7, women are at higher risk than men.  Having obstructive sleep apnea.  Stress. What are the signs or symptoms? High blood pressure may not cause symptoms. Very  high blood pressure (hypertensive crisis) may cause:  Headache.  Anxiety.  Shortness of breath.  Nosebleed.  Nausea and vomiting.  Vision changes.  Severe chest pain.  Seizures. How is this diagnosed? This condition is diagnosed by measuring your blood pressure while you are seated, with your arm resting on a flat surface, your legs uncrossed, and your feet flat on the floor. The cuff of the blood pressure monitor will be placed directly against the skin of your upper arm at the level of your heart. It should be measured at least twice using the same arm. Certain conditions can cause a difference in blood pressure between your right and left arms. Certain factors can cause blood pressure readings to be lower or higher than normal for a short period of time:  When your blood pressure is higher when you are in a health care provider's office than when you are at home, this is called white coat hypertension. Most people with this condition do not need medicines.  When your blood pressure is higher at home than when you are in a health care provider's office, this is called masked hypertension. Most people with this condition may need medicines to control blood pressure. If you have a high blood pressure reading during one visit or you have normal blood pressure with other risk factors, you may be asked to:  Return on a different day to have your blood pressure checked again.  Monitor your blood pressure at home for 1 week or longer. If you are diagnosed with hypertension, you may have other blood  or imaging tests to help your health care provider understand your overall risk for other conditions. How is this treated? This condition is treated by making healthy lifestyle changes, such as eating healthy foods, exercising more, and reducing your alcohol intake. Your health care provider may prescribe medicine if lifestyle changes are not enough to get your blood pressure under control, and  if:  Your systolic blood pressure is above 130.  Your diastolic blood pressure is above 80. Your personal target blood pressure may vary depending on your medical conditions, your age, and other factors. Follow these instructions at home: Eating and drinking   Eat a diet that is high in fiber and potassium, and low in sodium, added sugar, and fat. An example eating plan is called the DASH (Dietary Approaches to Stop Hypertension) diet. To eat this way: ? Eat plenty of fresh fruits and vegetables. Try to fill one half of your plate at each meal with fruits and vegetables. ? Eat whole grains, such as whole-wheat pasta, brown rice, or whole-grain bread. Fill about one fourth of your plate with whole grains. ? Eat or drink low-fat dairy products, such as skim milk or low-fat yogurt. ? Avoid fatty cuts of meat, processed or cured meats, and poultry with skin. Fill about one fourth of your plate with lean proteins, such as fish, chicken without skin, beans, eggs, or tofu. ? Avoid pre-made and processed foods. These tend to be higher in sodium, added sugar, and fat.  Reduce your daily sodium intake. Most people with hypertension should eat less than 1,500 mg of sodium a day.  Do not drink alcohol if: ? Your health care provider tells you not to drink. ? You are pregnant, may be pregnant, or are planning to become pregnant.  If you drink alcohol: ? Limit how much you use to:  0-1 drink a day for women.  0-2 drinks a day for men. ? Be aware of how much alcohol is in your drink. In the [place] one drink equals one 12 oz bottle of beer (355 mL), one 5 oz glass of wine (148 mL), or one 1 oz glass of hard liquor (44 mL). Lifestyle   Work with your health care provider to maintain a healthy body weight or to lose weight. Ask what an ideal weight is for you.  Get at least 30 minutes of exercise most days of the week. Activities may include walking, swimming, or biking.  Include exercise to  strengthen your muscles (resistance exercise), such as Pilates or lifting weights, as part of your weekly exercise routine. Try to do these types of exercises for 30 minutes at least 3 days a week.  Do not use any products that contain nicotine or tobacco, such as cigarettes, e-cigarettes, and chewing tobacco. If you need help quitting, ask your health care provider.  Monitor your blood pressure at home as told by your health care provider.  Keep all follow-up visits as told by your health care provider. This is important. Medicines  Take over-the-counter and prescription medicines only as told by your health care provider. Follow directions carefully. Blood pressure medicines must be taken as prescribed.  Do not skip doses of blood pressure medicine. Doing this puts you at risk for problems and can make the medicine less effective.  Ask your health care provider about side effects or reactions to medicines that you should watch for. Contact a health care provider if you:  Think you are having a reaction to a medicine  you are taking.  Have headaches that keep coming back (recurring).  Feel dizzy.  Have swelling in your ankles.  Have trouble with your vision. Get help right away if you:  Develop a severe headache or confusion.  Have unusual weakness or numbness.  Feel faint.  Have severe pain in your chest or abdomen.  Vomit repeatedly.  Have trouble breathing. Summary  Hypertension is when the force of blood pumping through your arteries is too strong. If this condition is not controlled, it may put you at risk for serious complications.  Your personal target blood pressure may vary depending on your medical conditions, your age, and other factors. For most people, a normal blood pressure is less than 120/80.  Hypertension is treated with lifestyle changes, medicines, or a combination of both. Lifestyle changes include losing weight, eating a healthy, low-sodium diet,  exercising more, and limiting alcohol. This information is not intended to replace advice given to you by your health care provider. Make sure you discuss any questions you have with your health care provider. Document Revised: 03/31/2018 Document Reviewed: 03/31/2018 Elsevier Patient Education  [place] DASH stands for "Dietary Approaches to Stop Hypertension." The DASH eating plan is a healthy eating plan that has been shown to reduce high blood pressure (hypertension). It may also reduce your risk for type 2 diabetes, heart disease, and stroke. The DASH eating plan may also help with weight loss. What are tips for following this plan?  General guidelines  Avoid eating more than 2,300 mg (milligrams) of salt (sodium) a day. If you have hypertension, you may need to reduce your sodium intake to 1,500 mg a day.  Limit alcohol intake to no more than 1 drink a day for nonpregnant women and 2 drinks a day for men. One drink equals 12 oz of beer, 5 oz of wine, or 1 oz of hard liquor.  Work with your health care provider to maintain a healthy body weight or to lose weight. Ask what an ideal weight is for you.  Get at least 30 minutes of exercise that causes your heart to beat faster (aerobic exercise) most days of the week. Activities may include walking, swimming, or biking.  Work with your health care provider or diet and nutrition specialist (dietitian) to adjust your eating plan to your individual calorie needs. Reading food labels   Check food labels for the amount of sodium per serving. Choose foods with less than 5 percent of the Daily Value of sodium. Generally, foods with less than 300 mg of sodium per serving fit into this eating plan.  To find whole grains, look for the word "whole" as the first word in the ingredient list. Shopping  Buy products labeled as "low-sodium" or "no salt added."  Buy fresh foods. Avoid canned foods and premade or frozen  meals. Cooking  Avoid adding salt when cooking. Use salt-free seasonings or herbs instead of table salt or sea salt. Check with your health care provider or pharmacist before using salt substitutes.  Do not fry foods. Cook foods using healthy methods such as baking, boiling, grilling, and broiling instead.  Cook with heart-healthy oils, such as olive, canola, soybean, or sunflower oil. Meal planning  Eat a balanced diet that includes: ? 5 or more servings of fruits and vegetables each day. At each meal, try to fill half of your plate with fruits and vegetables. ? Up to 6-8 servings of whole grains each day. ?  Less than 6 oz of lean meat, poultry, or fish each day. A 3-oz serving of meat is about the same size as a deck of cards. One egg equals 1 oz. ? 2 servings of low-fat dairy each day. ? A serving of nuts, seeds, or beans 5 times each week. ? Heart-healthy fats. Healthy fats called Omega-3 fatty acids are found in foods such as flaxseeds and coldwater fish, like sardines, salmon, and mackerel.  Limit how much you eat of the following: ? Canned or prepackaged foods. ? Food that is high in trans fat, such as fried foods. ? Food that is high in saturated fat, such as fatty meat. ? Sweets, desserts, sugary drinks, and other foods with added sugar. ? Full-fat dairy products.  Do not salt foods before eating.  Try to eat at least 2 vegetarian meals each week.  Eat more home-cooked food and less restaurant, buffet, and fast food.  When eating at a restaurant, ask that your food be prepared with less salt or no salt, if possible. What foods are recommended? The items listed may not be a complete list. Talk with your dietitian about what dietary choices are best for you. Grains Whole-grain or whole-wheat bread. Whole-grain or whole-wheat pasta. Brown rice. [place]. Bulgur. Whole-grain and low-sodium cereals. Pita bread. Low-fat, low-sodium crackers. Whole-wheat flour tortillas.  Vegetables Fresh or frozen vegetables (raw, steamed, roasted, or grilled). Low-sodium or reduced-sodium tomato and vegetable juice. Low-sodium or reduced-sodium tomato sauce and tomato paste. Low-sodium or reduced-sodium canned vegetables. Fruits All fresh, dried, or frozen fruit. Canned fruit in natural juice (without added sugar). Meat and other protein foods Skinless chicken or [place]. Ground chicken or [place]. Pork with fat trimmed off. Fish and seafood. Egg whites. Dried beans, peas, or lentils. Unsalted nuts, nut butters, and seeds. Unsalted canned beans. Lean cuts of beef with fat trimmed off. Low-sodium, lean deli meat. Dairy Low-fat (1%) or fat-free (skim) milk. Fat-free, low-fat, or reduced-fat cheeses. Nonfat, low-sodium ricotta or cottage cheese. Low-fat or nonfat yogurt. Low-fat, low-sodium cheese. Fats and oils Soft margarine without trans fats. Vegetable oil. Low-fat, reduced-fat, or light mayonnaise and salad dressings (reduced-sodium). Canola, safflower, olive, soybean, and sunflower oils. Avocado. Seasoning and other foods Herbs. Spices. Seasoning mixes without salt. Unsalted popcorn and pretzels. Fat-free sweets. What foods are not recommended? The items listed may not be a complete list. Talk with your dietitian about what dietary choices are best for you. Grains Baked goods made with fat, such as croissants, muffins, or some breads. Dry pasta or rice meal packs. Vegetables Creamed or fried vegetables. Vegetables in a cheese sauce. Regular canned vegetables (not low-sodium or reduced-sodium). Regular canned tomato sauce and paste (not low-sodium or reduced-sodium). Regular tomato and vegetable juice (not low-sodium or reduced-sodium). Angie Fava. Olives. Fruits Canned fruit in a light or heavy syrup. Fried fruit. Fruit in cream or butter sauce. Meat and other protein foods Fatty cuts of meat. Ribs. Fried meat. Berniece Salines. Sausage. Bologna and other processed lunch meats. Salami.  Fatback. Hotdogs. Bratwurst. Salted nuts and seeds. Canned beans with added salt. Canned or smoked fish. Whole eggs or egg yolks. Chicken or [place] with skin. Dairy Whole or 2% milk, cream, and half-and-half. Whole or full-fat cream cheese. Whole-fat or sweetened yogurt. Full-fat cheese. Nondairy creamers. Whipped toppings. Processed cheese and cheese spreads. Fats and oils Butter. Stick margarine. Lard. Shortening. Ghee. Bacon fat. Tropical oils, such as coconut, palm kernel, or palm oil. Seasoning and other foods Salted popcorn and pretzels. Onion salt,  garlic salt, seasoned salt, table salt, and sea salt. Worcestershire sauce. Tartar sauce. Barbecue sauce. Teriyaki sauce. Soy sauce, including reduced-sodium. Steak sauce. Canned and packaged gravies. Fish sauce. Oyster sauce. Cocktail sauce. Horseradish that you find on the shelf. Ketchup. Mustard. Meat flavorings and tenderizers. Bouillon cubes. Hot sauce and Tabasco sauce. Premade or packaged marinades. Premade or packaged taco seasonings. Relishes. Regular salad dressings. Where to find more information:  National Heart, Lung, and [place]egino Ramirez: https://wilson-eaton.com/  American Heart Association: www.heart.org Summary  The DASH eating plan is a healthy eating plan that has been shown to reduce high blood pressure (hypertension). It may also reduce your risk for type 2 diabetes, heart disease, and stroke.  With the DASH eating plan, you should limit salt (sodium) intake to 2,300 mg a day. If you have hypertension, you may need to reduce your sodium intake to 1,500 mg a day.  When on the DASH eating plan, aim to eat more fresh fruits and vegetables, whole grains, lean proteins, low-fat dairy, and heart-healthy fats.  Work with your health care provider or diet and nutrition specialist (dietitian) to adjust your eating plan to your individual calorie needs. This information is not intended to replace advice given to you by your health care  provider. Make sure you discuss any questions you have with your health care provider. Document Revised: 07/03/2017 Document Reviewed: 07/14/2016 Elsevier Patient Education  2020 [employer].     Why follow it? Research shows. . Those who follow the Mediterranean diet have a reduced risk of heart disease  . The diet is associated with a reduced incidence of Parkinson's and Alzheimer's diseases . People following the diet may have longer life expectancies and lower rates of chronic diseases  . The Dietary Guidelines for Americans recommends the Mediterranean diet as an eating plan to promote health and prevent disease  What Is the Mediterranean Diet?  . Healthy eating plan based on typical foods and recipes of Mediterranean-style cooking . The diet is primarily a plant based diet; these foods should make up a majority of meals   Starches - Plant based foods should make up a majority of meals - They are an important sources of vitamins, minerals, energy, antioxidants, and fiber - Choose whole grains, foods high in fiber and minimally processed items  - Typical grain sources include wheat, oats, barley, corn, brown rice, bulgar, farro, millet, polenta, couscous  - Various types of beans include chickpeas, lentils, fava beans, black beans, white beans   Fruits  Veggies - Large quantities of antioxidant rich fruits & veggies; 6 or more servings  - Vegetables can be eaten raw or lightly drizzled with oil and cooked  - Vegetables common to the traditional Mediterranean Diet include: artichokes, arugula, beets, broccoli, brussel sprouts, cabbage, carrots, celery, collard greens, cucumbers, eggplant, kale, leeks, lemons, lettuce, mushrooms, okra, onions, peas, peppers, potatoes, pumpkin, radishes, rutabaga, shallots, spinach, sweet potatoes, turnips, zucchini - Fruits common to the Mediterranean Diet include: apples, apricots, avocados, cherries, clementines, dates, figs, grapefruits, grapes, melons,  nectarines, oranges, peaches, pears, pomegranates, strawberries, tangerines  Fats - Replace butter and margarine with healthy oils, such as olive oil, canola oil, and tahini  - Limit nuts to no more than a handful a day  - Nuts include walnuts, almonds, pecans, pistachios, pine nuts  - Limit or avoid candied, honey roasted or heavily salted nuts - Olives are central to the Mediterranean diet - can be eaten whole or used in a variety of dishes  Meats Protein - Limiting red meat: no more than a few times a month - When eating red meat: choose lean cuts and keep the portion to the size of deck of cards - Eggs: approx. 0 to 4 times a week  - Fish and lean poultry: at least 2 a week  - Healthy protein sources include, chicken, [place], lean beef, lamb - Increase intake of seafood such as tuna, salmon, trout, mackerel, shrimp, scallops - Avoid or limit high fat processed meats such as sausage and bacon  Dairy - Include moderate amounts of low fat dairy products  - Focus on healthy dairy such as fat free yogurt, skim milk, low or reduced fat cheese - Limit dairy products higher in fat such as whole or 2% milk, cheese, ice cream  Alcohol - Moderate amounts of red wine is ok  - No more than 5 oz daily for women (all ages) and men older than age 6  - No more than 10 oz of wine daily for men younger than 51  Other - Limit sweets and other desserts  - Use herbs and spices instead of salt to flavor foods  - Herbs and spices common to the traditional Mediterranean Diet include: basil, bay leaves, chives, cloves, cumin, fennel, garlic, lavender, marjoram, mint, oregano, parsley, pepper, rosemary, sage, savory, sumac, tarragon, thyme   It's not just a diet, it's a lifestyle:  . The Mediterranean diet includes lifestyle factors typical of those in the region  . Foods, drinks and meals are best eaten with others and savored . Daily physical activity is important for overall good health . This could be  strenuous exercise like running and aerobics . This could also be more leisurely activities such as walking, housework, yard-work, or taking the stairs . Moderation is the key; a balanced and healthy diet accommodates most foods and drinks . Consider portion sizes and frequency of consumption of certain foods   Meal Ideas & Options:  . Breakfast:  o Whole wheat toast or whole wheat English muffins with peanut butter & hard boiled egg o Steel cut oats topped with apples & cinnamon and skim milk  o Fresh fruit: banana, strawberries, melon, berries, peaches  o Smoothies: strawberries, bananas, greek yogurt, peanut butter o Low fat greek yogurt with blueberries and granola  o Egg white omelet with spinach and mushrooms o Breakfast couscous: whole wheat couscous, apricots, skim milk, cranberries  . Sandwiches:  o Hummus and grilled vegetables (peppers, zucchini, squash) on whole wheat bread   o Grilled chicken on whole wheat pita with lettuce, tomatoes, cucumbers or tzatziki  o Tuna salad on whole wheat bread: tuna salad made with greek yogurt, olives, red peppers, capers, green onions o Garlic rosemary lamb pita: lamb sauted with garlic, rosemary, salt & pepper; add lettuce, cucumber, greek yogurt to pita - flavor with lemon juice and black pepper  . Seafood:  o Mediterranean grilled salmon, seasoned with garlic, basil, parsley, lemon juice and black pepper o Shrimp, lemon, and spinach whole-grain pasta salad made with low fat greek yogurt  o Seared scallops with lemon orzo  o Seared tuna steaks seasoned salt, pepper, coriander topped with tomato mixture of olives, tomatoes, olive oil, minced garlic, parsley, green onions and cappers  . Meats:  o Herbed greek chicken salad with kalamata olives, cucumber, feta  o Red bell peppers stuffed with spinach, bulgur, lean ground beef (or lentils) & topped with feta   o Kebabs: skewers of chicken, tomatoes, onions, zucchini,  squash  o [place] burgers:  made with red onions, mint, dill, lemon juice, feta cheese topped with roasted red peppers . Vegetarian o Cucumber salad: cucumbers, artichoke hearts, celery, red onion, feta cheese, tossed in olive oil & lemon juice  o Hummus and whole grain pita points with a greek salad (lettuce, tomato, feta, olives, cucumbers, red onion) o Lentil soup with celery, carrots made with vegetable broth, garlic, salt and pepper  o Tabouli salad: parsley, bulgur, mint, scallions, cucumbers, tomato, radishes, lemon juice, olive oil, salt and pepper.       Plan de alimentacin DASH DASH Eating Plan DASH es la sigla en ingls de "Enfoques Alimentarios para Detener la Hipertensin" (Dietary Approaches to Stop Hypertension). El plan de alimentacin DASH ha demostrado bajar la presin arterial elevada (hipertensin). Tambin puede reducir [employer] de diabetes tipo 2, enfermedad cardaca y accidente cerebrovascular. Este plan tambin puede ayudar a Horticulturist, commercial. Consejos para seguir este plan  Pautas generales  Evite ingerir ms de 2,300 mg (miligramos) de sal (sodio) por da. Si tiene hipertensin, es posible que necesite reducir la ingesta de sodio a 1,500 mg por da.  Limite el consumo de alcohol a no ms de 61medida por da si es mujer y no est [place], y 13medidas por da si es hombre. Una medida equivale a 12oz (351ml) de cerveza, 5oz (120ml) de vino o 1oz (66ml) de bebidas alcohlicas de alta graduacin.  Trabaje con su mdico para mantener un peso saludable o perder [employer]. Pregntele cul es el peso recomendado para usted.  Realice al menos 30 minutos de ejercicio que haga que se acelere su corazn (ejercicio Arboriculturist) la [employer] de la [place]. Estas actividades pueden incluir caminar, nadar o andar en bicicleta.  Trabaje con su mdico o especialista en alimentacin y nutricin (nutricionista) para ajustar su plan alimentario a sus necesidades calricas personales. Lectura de las etiquetas  de los alimentos   Verifique en las etiquetas de los alimentos, la cantidad de sodio por porcin. Elija alimentos con menos del 5 por ciento del valor diario de sodio. Generalmente, los alimentos con menos de 300 mg de sodio por porcin se encuadran dentro de este plan alimentario.  Para encontrar cereales integrales, busque la palabra "integral" como primera palabra en la lista de ingredientes. De compras  Compre productos en los que en su etiqueta diga: "bajo contenido de sodio" o "sin agregado de sal".  Compre alimentos frescos. Evite los alimentos enlatados y comidas precocidas o congeladas. Coccin  Evite agregar sal cuando cocine. Use hierbas o aderezos sin sal, en lugar de sal de mesa o sal marina. Consulte al mdico o farmacutico antes de usar sustitutos de la sal.  No fra los alimentos. A la hora de cocinar los alimentos opte por hornearlos, hervirlos, grillarlos y asarlos a Administrator, arts.  Cocine con aceites cardiosaludables, como oliva, canola, soja o girasol. Planificacin de las comidas  Consuma una dieta equilibrada, que incluya lo siguiente: ? 5o ms porciones de frutas y Set designer. Trate de que la mitad del plato de cada comida sean frutas y verduras. ? Hasta 6 u 8 porciones de cereales integrales por da. ? Menos de 6 onzas de carne, aves o pescado Games developer. Una porcin de 3 onzas de carne tiene casi el mismo tamao que un mazo de cartas. Un huevo equivale a 1 onza. ? Dos porciones de productos lcteos descremados por Training and development officer. ? Una porcin de frutos secos, semillas o frijoles 5 veces por semana. ?  Grasas cardiosaludables. Las grasas saludables llamadas cidos grasos omega-3 se encuentran en alimentos como semillas de lino y pescados de agua fra, como por ejemplo, sardinas, salmn y caballa.  Limite la cantidad que ingiere de los siguientes alimentos: ? Alimentos enlatados o envasados. ? Alimentos con alto contenido de grasa trans, como alimentos fritos. ?  Alimentos con alto contenido de grasa saturada, como carne con grasa. ? Dulces, postres, bebidas azucaradas y otros alimentos con azcar agregada. ? Productos lcteos enteros.  No le agregue sal a los alimentos antes de probarlos.  Trate de comer al menos 2 comidas vegetarianas por semana.  Consuma ms comida casera y menos de restaurante, de bufs y comida rpida.  Cuando coma en un restaurante, pida que preparen su comida con menos sal o, en lo posible, sin nada de sal. Qu alimentos se recomiendan? Los alimentos enumerados a continuacin no constituyen Furniture conservator/restorer. Hable con el nutricionista sobre las mejores opciones alimenticias para usted. Cereales Pan de salvado o integral. Pasta de salvado o integral. Arroz integral. Avena. Quinua. Trigo burgol. Cereales integrales y con bajo contenido de sodio. Pan pita. Galletitas de [place] con bajo contenido de [place] y [place]. Tortillas de [place] integral. Verduras Verduras frescas o congeladas (crudas, al vapor, asadas o grilladas). Jugos de tomate y verduras con bajo contenido de sodio o reducidos en sodio. Salsa y pasta de tomate con bajo contenido de sodio o reducidas en sodio. Verduras enlatadas con bajo contenido de sodio o reducidas en sodio. Frutas Todas las frutas frescas, congeladas o disecadas. Frutas enlatadas en jugo natural (sin agregado de azcar). Carne y otros alimentos proteicos Pollo o pavo sin piel. Carne de pollo o de [place]. Cerdo desgrasado. Pescado y [employer]. Claras de huevo. Porotos, guisantes o lentejas secos. Frutos secos, mantequilla de frutos secos y semillas sin sal. Frijoles enlatados sin sal. Cortes de carne vacuna magra, desgrasada. Embutidos magros, con bajo contenido de [place]. Lcteos Leche descremada (1%) o descremada. Quesos sin grasa, con bajo contenido de grasa o descremados. Queso blanco o ricota sin grasa, con bajo contenido de [place]. Yogur semidescremado o descremado. Queso con bajo contenido de  [place] y [place]. Grasas y [employer] untables que no contengan grasas trans. Aceite vegetal. Lubertha Basque y aderezos para ensaladas livianos o con bajo contenido de grasas (reducidos en sodio). Aceite de canola, crtamo, oliva, soja y [place]. [place]. Condimentos y otros alimentos Hierbas. Especias. Mezclas de condimentos sin sal. Palomitas de maz y pretzels sin sal. Dulces con bajo contenido de grasas. Qu alimentos no se recomiendan? Los alimentos enumerados a continuacin no constituyen Furniture conservator/restorer. Hable con el nutricionista sobre las mejores opciones alimenticias para usted. Cereales Productos de panificacin hechos con grasa, como medialunas, magdalenas y algunos panes. Comidas con arroz o pasta seca listas para usar. Verduras Verduras con crema o fritas. Verduras en [place]. Verduras enlatadas regulares (que no sean con bajo contenido de sodio o reducidas en sodio). Pasta y salsa de tomates enlatadas regulares (que no sean con bajo contenido de sodio o reducidas en sodio). Jugos de tomate y verduras regulares (que no sean con bajo contenido de sodio o reducidos en sodio). Pepinillos. Aceitunas. Lambert Mody Fruta enlatada en almbar liviano o espeso. Frutas cocidas en aceite. Frutas con salsa de crema o [place]. Carne y otros alimentos proteicos Cortes de carne con grasa. Costillas. Carne frita. [place]. Salchichas. Mortadela y otras carnes procesadas. Salame. Panceta. Perros calientes (hotdogs). Strum. Frutos secos y semillas con sal. Frijoles enlatados con  agregado de sal. Pescado enlatado o ahumado. Huevos enteros o yemas. Pollo o pavo con piel. Lcteos Leche entera o al 2%, crema y mitad leche y mitad crema. Queso crema entero o con toda su grasa. Yogur entero o endulzado. Quesos con toda su grasa. Sustitutos de cremas no lcteas. Coberturas batidas. Quesos para untar y quesos procesados. Grasas y [employer]. Margarina en barra. Acequia.  Materia grasa. Mantequilla clarificada. Grasa de panceta. Aceites tropicales como aceite de coco, palmiste o palma. Condimentos y otros alimentos Palomitas de maz y pretzels con sal. Sal de cebolla, sal de ajo, sal condimentada, sal de mesa y sal marina. Salsa Worcestershire. Salsa trtara. Salsa barbacoa. Salsa teriyaki. Salsa de soja, incluso la que tiene contenido reducido de [place]. Salsa de carne. Salsas en lata y envasadas. Salsa de pescado. Salsa de [place]. Salsa rosada. Rbano picante envasado. [place]. Mostaza. Saborizantes y tiernizantes para carne. Caldo en cubitos. Salsa picante y salsa tabasco. Escabeches envasados o ya preparados. Aderezos para tacos prefabricados o envasados. Salsas. Aderezos comunes para ensalada. Dnde encontrar ms informacin:  [place], los Pulmones y Herbalist (National Heart, Lung, and [place]): https://wilson-eaton.com/  Asociacin Estadounidense del Corazn (American Heart Association): www.heart.org Resumen  El plan de alimentacin DASH ha demostrado bajar la presin arterial elevada (hipertensin). Tambin puede reducir UnitedHealth de diabetes tipo 2, enfermedad cardaca y accidente cerebrovascular.  Con el plan de alimentacin DASH, deber limitar el consumo de sal (sodio) a 2,300 mg por da. Si tiene hipertensin, es posible que necesite reducir la ingesta de sodio a 1,500 mg por da.  Cuando siga el plan de alimentacin DASH, trate de comer ms frutas frescas y verduras, cereales integrales, carnes magras, lcteos descremados y grasas cardiosaludables.  Trabaje con su mdico o especialista en alimentacin y nutricin (nutricionista) para ajustar su plan alimentario a sus necesidades calricas personales. Esta informacin no tiene Marine scientist el consejo del mdico. Asegrese de hacerle al mdico cualquier pregunta que tenga. Document Revised: 11/10/2016 Document Reviewed: 11/10/2016 Elsevier Patient Education  [place].

## 2019-10-13 ENCOUNTER — Other Ambulatory Visit: Payer: Self-pay

## 2019-10-13 ENCOUNTER — Encounter: Payer: Self-pay | Admitting: Adult Health

## 2019-10-13 DIAGNOSIS — L570 Actinic keratosis: Secondary | ICD-10-CM | POA: Insufficient documentation

## 2019-10-17 ENCOUNTER — Encounter: Payer: Self-pay | Admitting: Adult Health

## 2019-10-17 ENCOUNTER — Other Ambulatory Visit: Payer: Self-pay

## 2019-10-17 ENCOUNTER — Ambulatory Visit (INDEPENDENT_AMBULATORY_CARE_PROVIDER_SITE_OTHER): Payer: Medicare Other | Admitting: Endocrinology

## 2019-10-17 ENCOUNTER — Encounter: Payer: Self-pay | Admitting: Endocrinology

## 2019-10-17 VITALS — BP 136/90 | HR 69 | Ht 70.0 in | Wt 205.2 lb

## 2019-10-17 DIAGNOSIS — E230 Hypopituitarism: Secondary | ICD-10-CM
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
## 2019-10-17 NOTE — Patient Instructions (Signed)
Testosterone treatment has risks, including increased or decreased fertility (depending on the type of treatment), hair loss, prostate cancer, benign prostate enlargement, blood clots, liver problems, lower hdl ("good cholesterol"), polycythemia (opposite of anemia), sleep apnea, and behavior changes.   I would be happy to see you back here as needed.

## 2019-11-09 DIAGNOSIS — E039 Hypothyroidism, unspecified: Secondary | ICD-10-CM | POA: Diagnosis not present

## 2019-11-09 DIAGNOSIS — E230 Hypopituitarism: Secondary | ICD-10-CM | POA: Diagnosis not present

## 2019-11-11 DIAGNOSIS — E230 Hypopituitarism: Secondary | ICD-10-CM | POA: Diagnosis not present

## 2019-11-11 DIAGNOSIS — E039 Hypothyroidism, unspecified: Secondary | ICD-10-CM | POA: Diagnosis not present

## 2019-11-11 DIAGNOSIS — E063 Autoimmune thyroiditis: Secondary | ICD-10-CM | POA: Diagnosis not present

## 2019-11-14 NOTE — Progress Notes (Deleted)
    Established patient visit   {  This note template is in development as part of the [facility].   This template contains optional SmartLists. If no selections are made from an optional SmartList, it will be automatically removed when the note is signed.  Left clicking SmartLists that are in blue, underlined text will show additional information regarding the patient's history unless you are already editing the note in a pop-up window.  This text will be automatically removed from this note when it is signed.:1}   Patient: Timothy Powell   DOB: 10/02/1975   44 y.o. Male  MRN: [id number] Visit Date: 11/14/2019 Today's healthcare provider: Marcille Buffy, FNP   Clint Bolder as a scribe for [place]be, FNP.,have documented all relevant documentation on the behalf of [place] Flinchum, FNP,as directed by  Marcille Buffy, FNP while in the presence of Marcille Buffy, [place]. Subjective:    Chief Complaint  Patient presents with  . Hypertension   HPI  Hypertension, follow-up:  BP Readings from Last 3 Encounters:  10/17/19 136/90  10/12/19 (!) 128/98  09/14/19 (!) 138/94    He was last seen for hypertension 1 months ago.  BP at that visit was 128/98. Management changes since that visit include starting patient on Cozaar 25mg  daily, patient encouraged heart healthy diet and working on lifestyle changes. He reports {excellent/good/fair/poor:19665} compliance with treatment. He {ACTION; IS/IS [id number] having side effects. *** He {is/is not:9024} exercising. He {is/is not:9024} adherent to low salt diet.   Outside blood pressures are ***. He is experiencing {Symptoms; cardiac:12860}.  Patient denies {Symptoms; cardiac:12860}.   Cardiovascular risk factors include {cv risk factors:510}.  Use of agents associated with hypertension: none.     Weight trend: {trend:16658} Wt Readings from Last 3  Encounters:  10/17/19 205 lb 3.2 oz (93.1 kg)  10/12/19 204 lb (92.5 kg)  09/14/19 206 lb 12.8 oz (93.8 kg)    Current diet: {diet habits:16563}  ------------------------------------------------------------------------   {Show patient history (optional):23778::" "}   Medications: Outpatient Medications Prior to Visit  Medication Sig  . ALPRAZolam (XANAX) 0.5 MG tablet Take 0.5 tablets (0.25 mg total) by mouth at bedtime as needed for anxiety.  . hydrochlorothiazide (HYDRODIURIL) 12.5 MG tablet Take 1 tablet (12.5 mg total) by mouth daily. (Patient not taking: Reported on 10/17/2019)  . losartan (COZAAR) 50 MG tablet Take 0.5 tablets (25 mg total) by mouth daily.  . magnesium oxide (MAG-OX) 400 MG tablet Take 400 mg by mouth daily.  . Misc Natural Products (GLUCOSAMINE CHOND COMPLEX/MSM) TABS Take 2 tablets by mouth daily.  . Multiple Vitamin (MULTI-VITAMINS) TABS Take 2 tablets by mouth daily. [place] MEGA MEN  . Testosterone 10 MG/ACT (2%) GEL Place onto the skin.  [name] testosterone cypionate (DEPOTESTOTERONE CYPIONATE) 100 MG/ML injection Inject 100 mg into the muscle every 14 (fourteen) days. For IM use only   No facility-administered medications prior to visit.    Review of Systems  {Show previous labs (optional):23779::" "}    Objective:    There were no vitals taken for this visit. {Show previous vital signs (optional):23777::" "}  Physical Exam  ***  No results found for any visits on 11/15/19.    Assessment & Plan:    ***     Marcille Buffy, [place] [phone] (phone) [phone] (fax)  [place]

## 2019-11-15 ENCOUNTER — Ambulatory Visit (INDEPENDENT_AMBULATORY_CARE_PROVIDER_SITE_OTHER): Payer: Medicare Other | Admitting: Adult Health

## 2019-11-15 DIAGNOSIS — Z538 Procedure and treatment not carried out for other reasons: Secondary | ICD-10-CM

## 2019-12-14 ENCOUNTER — Other Ambulatory Visit: Payer: Self-pay | Admitting: Adult Health

## 2019-12-14 ENCOUNTER — Ambulatory Visit: Payer: Medicare Other | Admitting: Adult Health

## 2019-12-14 NOTE — Telephone Encounter (Signed)
Requested medication (s) are due for refill today:yes  Requested medication (s) are on the active medication list: yes  Last refill: 10/12/2019   #30  0 refills  Future visit scheduled yes, today 12/14/2019  Notes to clinic:not delegated  Requested Prescriptions  Pending Prescriptions Disp Refills   ALPRAZolam (XANAX) 0.5 MG tablet [Pharmacy Med Name: ALPRAZOLAM 0.5MG  TABLETS] 30 tablet     Sig: TAKE 1/2 TABLET(0.25 MG) BY MOUTH AT BEDTIME AS NEEDED FOR ANXIETY      Not Delegated - Psychiatry:  Anxiolytics/Hypnotics Failed - 12/14/2019  9:56 AM      Failed - This refill cannot be delegated      Failed - Urine Drug Screen completed in last 360 days.      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

## 2019-12-15 NOTE — Telephone Encounter (Signed)
Please review request below. KW 

## 2020-01-11 ENCOUNTER — Telehealth: Payer: Self-pay | Admitting: Adult Health

## 2020-01-11 NOTE — Telephone Encounter (Signed)
Left message for patient to call back and schedule Medicare Annual Wellness Visit (AWV) either virtually or audio only.  No hx of AWV; please schedule at anytime with [facility] Health Advisor.

## 2020-01-28 ENCOUNTER — Other Ambulatory Visit: Payer: Self-pay | Admitting: Adult Health

## 2020-01-28 NOTE — Telephone Encounter (Signed)
Requested medication (s) are due for refill today: yes  Requested medication (s) are on the active medication list: no  Last refill:  12/15/19  Future visit scheduled: no  Notes to clinic:  med not delegated to NT to RF/// Prescription expired 01/14/20   Requested Prescriptions  Pending Prescriptions Disp Refills   ALPRAZolam (XANAX) 0.5 MG tablet [Pharmacy Med Name: ALPRAZOLAM 0.5MG  TABLETS] 30 tablet     Sig: TAKE 1/2 TABLET(0.25 MG) BY MOUTH AT BEDTIME AS NEEDED FOR ANXIETY      Not Delegated - Psychiatry:  Anxiolytics/Hypnotics Failed - 01/28/2020  6:42 AM      Failed - This refill cannot be delegated      Failed - Urine Drug Screen completed in last 360 days.      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

## 2020-01-31 NOTE — Telephone Encounter (Signed)
To soon for refill, two month supply was given one month ago. Do not recommend daily use and patient reported only taking PRN and 0.25 mg half tablet.

## 2020-02-14 ENCOUNTER — Other Ambulatory Visit: Payer: Self-pay

## 2020-02-14 ENCOUNTER — Encounter: Payer: Self-pay | Admitting: Adult Health

## 2020-02-14 ENCOUNTER — Ambulatory Visit (INDEPENDENT_AMBULATORY_CARE_PROVIDER_SITE_OTHER): Payer: Medicare Other | Admitting: Adult Health

## 2020-02-14 VITALS — BP 140/100 | HR 78 | Temp 96.8°F | Resp 16 | Wt 210.6 lb

## 2020-02-14 DIAGNOSIS — I152 Hypertension secondary to endocrine disorders: Secondary | ICD-10-CM

## 2020-02-14 DIAGNOSIS — Z1211 Encounter for screening for malignant neoplasm of colon: Secondary | ICD-10-CM | POA: Diagnosis not present

## 2020-02-14 DIAGNOSIS — M779 Enthesopathy, unspecified: Secondary | ICD-10-CM

## 2020-02-14 DIAGNOSIS — Z79899 Other long term (current) drug therapy: Secondary | ICD-10-CM

## 2020-02-14 DIAGNOSIS — G4700 Insomnia, unspecified: Secondary | ICD-10-CM

## 2020-02-14 DIAGNOSIS — E039 Hypothyroidism, unspecified: Secondary | ICD-10-CM

## 2020-02-14 DIAGNOSIS — M542 Cervicalgia: Secondary | ICD-10-CM | POA: Insufficient documentation

## 2020-02-14 DIAGNOSIS — Z6830 Body mass index (BMI) 30.0-30.9, adult: Secondary | ICD-10-CM

## 2020-02-14 MED ORDER — PREDNISONE 10 MG (21) PO TBPK: ORAL_TABLET | ORAL | 0 refills | Status: DC

## 2020-02-14 MED ORDER — ALPRAZOLAM 0.5 MG PO TABS: 0.5000 mg | ORAL_TABLET | Freq: Every evening | ORAL | 0 refills | Status: DC | PRN

## 2020-02-14 NOTE — Patient Instructions (Signed)
Tendinitis  Tendinitis is inflammation of a tendon. A tendon is a strong cord of tissue that connects muscle to bone. Tendinitis can affect any tendon, but it most commonly affects the:  Shoulder tendon (rotator cuff).  Ankle tendon (Achilles tendon).  Elbow tendon (triceps tendon).  Tendons in the wrist. What are the causes? This condition may be caused by:  Overusing a tendon or muscle. This is common.  Age-related wear and tear.  Injury.  Inflammatory conditions, such as arthritis.  Certain medicines. What increases the risk? You are more likely to develop this condition if you do activities that involve the same movements over and over again (repetitive motions). What are the signs or symptoms? Symptoms of this condition may include:  Pain.  Tenderness.  Mild swelling.  Decreased range of motion. How is this diagnosed? This condition is diagnosed with a physical exam. You may also have tests, such as:  Ultrasound. This uses sound waves to make an image of the inside of your body in the affected area.  MRI. How is this treated? This condition may be treated by resting, icing, applying pressure (compression), and raising (elevating) the affected area above the level of your heart. This is known as RICE therapy. Treatment may also include:  Medicines to help reduce inflammation or to help reduce pain.  Exercises or physical therapy to strengthen and stretch the tendon.  A brace or splint.  Surgery. This is rarely needed. Follow these instructions at home: If you have a splint or brace:  Wear the splint or brace as told by your health care provider. Remove it only as told by your health care provider.  Loosen the splint or brace if your fingers or toes tingle, become numb, or turn cold and blue.  Keep the splint or brace clean.  If the splint or brace is not waterproof: ? Do not let it get wet. ? Cover it with a watertight covering when you take a bath  or shower. Managing pain, stiffness, and swelling  If directed, put ice on the affected area. ? If you have a removable splint or brace, remove it as told by your health care provider. ? Put ice in a plastic bag. ? Place a towel between your skin and the bag. ? Leave the ice on for 20 minutes, 2-3 times a day.  Move the fingers or toes of the affected limb often, if this applies. This can help to prevent stiffness and lessen swelling.  If directed, raise (elevate) the affected area above the level of your heart while you are sitting or lying down.  If directed, apply heat to the affected area before you exercise. Use the heat source that your health care provider recommends, such as a moist heat pack or a heating pad.     ? Place a towel between your skin and the heat source. ? Leave the heat on for 20-30 minutes. ? Remove the heat if your skin turns bright red. This is especially important if you are unable to feel pain, heat, or cold. You may have a greater risk of getting burned. Driving  Do not drive or use heavy machinery while taking prescription pain medicine.  Ask your health care provider when it is safe to drive if you have a splint or brace on any part of your arm or leg. Activity  Rest the affected area as told by your health care provider.  Return to your normal activities as told by your health care   provider. Ask your health care provider what activities are safe for you.  Avoid using the affected area while you are experiencing symptoms of tendinitis.  Do exercises as told by your health care provider. General instructions  If you have a splint, do not put pressure on any part of the splint until it is fully hardened. This may take several hours.  Wear an elastic bandage or compression wrap only as told by your health care provider.  Take over-the-counter and prescription medicines only as told by your health care provider.  Keep all follow-up visits as told  by your health care provider. This is important. Contact a health care provider if:  Your symptoms do not improve.  You develop new, unexplained problems, such as numbness in your hands. Summary  Tendinitis is inflammation of a tendon.  You are more likely to develop this condition if you do activities that involve the same movements over and over again.  This condition may be treated by resting, icing, applying pressure (compression), and elevating the area above the level of your heart. This is known as RICE therapy.  Avoid using the affected area while you are experiencing symptoms of tendinitis. This information is not intended to replace advice given to you by your health care provider. Make sure you discuss any questions you have with your health care provider. Document Revised: 01/26/2018 Document Reviewed: 12/09/2017 Elsevier Patient Education  2020 [employer] Inc.  

## 2020-02-14 NOTE — Progress Notes (Signed)
Established patient visit   Patient: Timothy Powell   DOB: 02-18-1976   44 y.o. Male  MRN: [id number] Visit Date: 02/14/2020  Today's healthcare provider: Marcille Buffy, FNP   No chief complaint on file.  Subjective    HPI  Hypertension, follow-up  BP Readings from Last 3 Encounters:  02/14/20 (!) 140/100  10/17/19 136/90  10/12/19 (!) 128/98   Wt Readings from Last 3 Encounters:  02/14/20 210 lb 9.6 oz (95.5 kg)  10/17/19 205 lb 3.2 oz (93.1 kg)  10/12/19 204 lb (92.5 kg)     He was last seen for hypertension 3 months ago.  BP at that visit was 128/98. Management since that visit includes patient was started on Cozaar 50mg .  He reports poor compliance with treatment. Patient discontinued medication a month ago stating that his blood pressure remained high He is having side effects. Patient reports that he just did not feel right on medication.  He is following a Regular diet. He is exercising. He does not smoke. Denies headaches.   He is exercising more now doing more cardio.    Use of agents associated with hypertension: steroids.  Currently on testosterone.  Testosterone level was elevated at last visit, he was taking more testosterone than previously prescribed.  He has since seen Dr. Lavonia Drafts at [facility] for his hypothyroidism, was first diagnosed in 2015, patient did have a positive thyroid peroxidase antibody as well and thyroid replacement was recommended however patient declined as he did not like the way that the Synthroid made him feel.  Patient reports that Dr. Elyse Hsu  resumed his testosterone gel and he has follow-ups with him to recheck testosterone levels.  See care everywhere.  Provider has educated patient that untreated hypothyroidism may be contributing to elevated blood pressures.  Last TSH was 15.424 on November 09, 2019.  Free T4 0.7.  Testosterone low at 112.  Free testosterone was 3.58 low.  Testosterone  bioavailable was 34 low.  Previous testosterone in the office was 1222 that was January 2021.  Outside blood pressures are patient reports systolic >409, diastolic >811. Symptoms: No chest pain No chest pressure  No palpitations No syncope  No dyspnea No orthopnea  No paroxysmal nocturnal dyspnea No lower extremity edema   Pertinent labs: Lab Results  Component Value Date   CHOL 167 08/23/2019   HDL 30 (L) 08/23/2019   LDLCALC 117 (H) 08/23/2019   TRIG 111 08/23/2019   Lab Results  Component Value Date   NA 139 08/23/2019   K 4.9 08/23/2019   CREATININE 1.34 (H) 08/23/2019   GFRNONAA 64 08/23/2019   GFRAA 74 08/23/2019   GLUCOSE 95 08/23/2019     The 10-year ASCVD risk score Mikey Bussing DC Jr., et al., 2013) is: 3.9%    Left arm tendonitis he believes, taking Ibuprofen at bed time. This is helping him sleep. No injury. Working out more.  Two month ago onset. Tendon is sore. Dr. Tamala Julian at sports medicine.   He had follow up with [facility] , and saw Dr. Arnoldo Morale on 02/13/2020., He is thinking of going back to [facility] neurosurgeon in [place] he reports.  --------------------------------------------------------------------------------------------------- Follow up for generalized anxiety and insomnia  The patient was last seen for this 3 months ago. Changes made at last visit include none patient was continued on Alprazolam 0.25mg .  He reports excellent compliance with treatment. He feels that condition is Improved. He is not having side effects.   -----------------------------------------------------------------------------------------  Patient Active Problem List   Diagnosis Date Noted   Actinic keratosis 10/13/2019   Long-term current use of testosterone replacement therapy 10/12/2019   Abnormal lead level in blood 10/12/2019   Hypertension 09/14/2019   Insomnia 09/14/2019   High risk medication use 09/14/2019   Spinal surgery in prior 3 months- 06/24/19  cervical  09/14/2019   Elevated testosterone level in male 09/14/2019   Glenoid labral tear, left, initial encounter 06/15/2018   Cauda equina syndrome ([place]) 12/02/2017   Sacral pain 12/02/2017   Neuropathic pain 12/02/2017   Body mass index (bmi) 28.0-28.9, adult 10/21/2017   Elevated blood pressure reading without diagnosis of hypertension 10/21/2017   Foot drop, left foot 10/21/2017   Muscle atrophy of lower extremity 10/21/2017   Pain in thoracic spine 10/21/2017   HNP (herniated nucleus pulposus), cervical 03/23/2017   Nonallopathic lesion of lumbosacral region 11/18/2016   Piriformis syndrome of right side 11/03/2016   Degenerative cervical disc 10/16/2016   Nonallopathic lesion of cervical region 10/16/2016   Nonallopathic lesion of thoracic region 10/16/2016   Nonallopathic lesion of sacral region 10/16/2016   Paraparesis ([place]) 10/16/2016   Chronic autoimmune 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   Misc Natural Products (GLUCOSAMINE CHOND COMPLEX/MSM) TABS Take 2 tablets by mouth daily.   Multiple Vitamin (MULTI-VITAMINS) TABS Take 2 tablets by mouth daily. GNC MEGA MEN   Testosterone (FORTESTA) 10 MG/ACT (2%) GEL Apply 7 pumps total on thighs as directed once a day   testosterone cypionate (DEPOTESTOTERONE CYPIONATE) 100 MG/ML injection Inject 100 mg into the muscle every 14 (fourteen) days. For IM use only   hydrochlorothiazide  (HYDRODIURIL) 12.5 MG tablet Take 1 tablet (12.5 mg total) by mouth daily. (Patient not taking: Reported on 10/17/2019)   [DISCONTINUED] losartan (COZAAR) 50 MG tablet Take 0.5 tablets (25 mg total) by mouth daily.   No facility-administered medications prior to visit.    Review of Systems  Constitutional: Negative.   HENT: Negative.   Respiratory: Negative.  Negative for choking and shortness of breath.   Cardiovascular: Negative.  Negative for chest pain, palpitations and leg swelling.  Genitourinary: Negative.   Musculoskeletal: Positive for myalgias (left arm ), neck pain (chronic post surgery saw Neurosurgeon Jenkins yesterday  - denies any change or injury. ) and neck stiffness. Negative for arthralgias.  Neurological: Negative.   Hematological: Negative.   Psychiatric/Behavioral: Negative.     Last CBC Lab Results  Component Value Date   WBC 5.3 08/23/2019   HGB 16.6 08/23/2019   HCT 49.6 08/23/2019   MCV 90 08/23/2019   MCH 30.1 08/23/2019   RDW 12.8 08/23/2019   PLT 265 14/97/0263   Last metabolic panel Lab Results  Component Value Date   GLUCOSE 95 08/23/2019   NA 139 08/23/2019   K 4.9 08/23/2019   CL 101 08/23/2019   CO2 26 08/23/2019   BUN 14 08/23/2019   CREATININE 1.34 (H) 08/23/2019   GFRNONAA 64 08/23/2019  GFRAA 74 08/23/2019   CALCIUM 9.5 08/23/2019   PROT 6.4 08/23/2019   ALBUMIN 4.4 08/23/2019   LABGLOB 2.0 08/23/2019   AGRATIO 2.2 08/23/2019   BILITOT 0.5 08/23/2019   ALKPHOS 54 08/23/2019   AST 24 08/23/2019   ALT 30 08/23/2019   ANIONGAP 8 06/13/2019   Last lipids Lab Results  Component Value Date   CHOL 167 08/23/2019   HDL 30 (L) 08/23/2019   LDLCALC 117 (H) 08/23/2019   TRIG 111 08/23/2019   Last hemoglobin A1c No results found for: HGBA1C Last thyroid functions Lab Results  Component Value Date   TSH 13.500 (H) 08/23/2019   T4TOTAL 5.3 08/23/2019   Last vitamin D Lab Results  Component Value Date   VD25OH 56.9  08/23/2019   Last vitamin B12 and Folate No results found for: VITAMINB12, FOLATE    Objective    BP (!) 140/100    Pulse 78    Temp (!) 96.8 F (36 C) (Oral)    Resp 16    Wt 210 lb 9.6 oz (95.5 kg)    SpO2 96%    BMI 30.22 kg/m  BP Readings from Last 3 Encounters:  02/14/20 (!) 140/100  10/17/19 136/90  10/12/19 (!) 128/98      Physical Exam Vitals and nursing note reviewed.  Constitutional:      General: He is not in acute distress.    Appearance: Normal appearance. He is well-developed. He is not ill-appearing, toxic-appearing or diaphoretic.     Comments: Patient is alert and oriented and responsive to questions Engages in eye contact with provider. Speaks in full sentences without any pauses without any shortness of breath or distress.    HENT:     Head: Normocephalic and atraumatic.     Right Ear: Hearing, tympanic membrane, ear canal and external ear normal.     Left Ear: Hearing, tympanic membrane, ear canal and external ear normal.     Nose: Nose normal.     Mouth/Throat:     Pharynx: Uvula midline. No oropharyngeal exudate.  Eyes:     General: Lids are normal. No scleral icterus.       Right eye: No discharge.        Left eye: No discharge.     Conjunctiva/sclera: Conjunctivae normal.     Pupils: Pupils are equal, round, and reactive to light.  Neck:     Thyroid: No thyromegaly.     Vascular: Normal carotid pulses. No carotid bruit, hepatojugular reflux or JVD.     Trachea: Trachea and phonation normal. No tracheal tenderness or tracheal deviation.     Meningeal: Brudzinski's sign absent.  Cardiovascular:     Rate and Rhythm: Normal rate and regular rhythm.     Pulses: Normal pulses.     Heart sounds: Normal heart sounds, S1 normal and S2 normal. Heart sounds not distant. No murmur heard.  No friction rub. No gallop.   Pulmonary:     Effort: Pulmonary effort is normal. No accessory muscle usage or respiratory distress.     Breath sounds: Normal breath  sounds. No stridor. No wheezing or rales.  Chest:     Chest wall: No tenderness.  Abdominal:     General: Bowel sounds are normal. There is no distension.     Palpations: Abdomen is soft. There is no mass.     Tenderness: There is no abdominal tenderness. There is no guarding or rebound.     Hernia: No hernia is present.  Musculoskeletal:        General: No deformity. Normal range of motion.     Right shoulder: Normal.     Left shoulder: Normal.     Right upper arm: Normal.     Left upper arm: Normal.     Right elbow: Normal.     Left elbow: Normal.     Right forearm: No tenderness.     Left forearm: Tenderness (Tenderness over left anterior tendon of forearm.  Pinpoint tenderness only.  Range of motion is normal.) present. No swelling, edema, deformity, lacerations or bony tenderness.     Cervical back: Full passive range of motion without pain, normal range of motion and neck supple.     Comments: Patient moves on and off of exam table and in room without difficulty. Gait is normal in hall and in room. Patient is oriented to person place time and situation. Patient answers questions appropriately and engages in conversation.   Lymphadenopathy:     Head:     Right side of head: No submental, submandibular, tonsillar, preauricular, posterior auricular or occipital adenopathy.     Left side of head: No submental, submandibular, tonsillar, preauricular, posterior auricular or occipital adenopathy.     Cervical: No cervical adenopathy.  Skin:    General: Skin is warm and dry.     Capillary Refill: Capillary refill takes less than 2 seconds.     Coloration: Skin is not pale.     Findings: No erythema or rash.     Nails: There is no clubbing.  Neurological:     Mental Status: He is alert and oriented to person, place, and time.     GCS: GCS eye subscore is 4. GCS verbal subscore is 5. GCS motor subscore is 6.     Cranial Nerves: No cranial nerve deficit.     Sensory: No sensory  deficit.     Motor: No abnormal muscle tone.     Coordination: Coordination normal.     Gait: Gait normal.     Deep Tendon Reflexes: Reflexes are normal and symmetric. Reflexes normal.  Psychiatric:        Speech: Speech normal.        Behavior: Behavior normal.        Thought Content: Thought content normal.        Judgment: Judgment normal.      No results found for any visits on 02/14/20.  Assessment & Plan     Tendonitis- left arm.  - Plan: predniSONE (STERAPRED UNI-PAK 21 TAB) 10 MG (21) TBPK tablet  Hypertension due to endocrine disorder - Plan: Ambulatory referral to Cardiology  Insomnia, unspecified type - Plan: ALPRAZolam (XANAX) 0.5 MG tablet  Screening for colon cancer - Plan: Cologuard  Body mass index (BMI) of 30.0-30.9 in adult - Plan: Ambulatory referral to Cardiology  High risk medication use- testosterone - Plan: Ambulatory referral to Cardiology   Orders Placed This Encounter  Procedures   Cologuard   Ambulatory referral to Cardiology    Referral Priority:   Routine    Referral Type:   Consultation    Referral Reason:   Specialty Services Required    Referred to Provider:   Nelva Bush, MD    Requested Specialty:   Cardiology    Number of Visits Requested:   1   Meds ordered this encounter  Medications   ALPRAZolam (XANAX) 0.5 MG tablet    Sig: Take 1 tablet (0.5 mg total) by mouth at bedtime  as needed for anxiety.    Dispense:  30 tablet    Refill:  0   predniSONE (STERAPRED UNI-PAK 21 TAB) 10 MG (21) TBPK tablet    Sig: PO: Take 6 tablets on day 1:Take 5 tablets day 2:Take 4 tablets day 3: Take 3 tablets day 4:Take 2 tablets day five: 5 Take 1 tablet day 6    Dispense:  21 tablet    Refill:  0   recommend trying Lexapro or other SSRI or SSNI patient declines. Long term use of benzodiazepines is not recommended. No refills before 03/17/2020 and should not be taking every night only as needed- not long term.   Hypertension, likely from  long-term untreated hypothyroidism, he also is on testosterone. He has no shortness of breath, chest pain neck pain jaw pain or any radiating symptoms.  He does request a referral to cardiology for evaluation, he declines any further treatment for his hypertension today he tried Cozaar and did not like the way it made him feel.  He declined to try any other hypertension agents today. Diet lifestyle advised.  Recheck cholesterol CBC and CMP around the end of July.  TSH recheck as well.  Return in about 3 months (around 05/16/2020), or if symptoms worsen or fail to improve, for at any time for any worsening symptoms, Go to Emergency room/ urgent care if worse.      I[place] Saralyn Willison, FNP, have reviewed all documentation for this visit. The documentation on 02/14/20 for the exam, diagnosis, procedures, and orders are all accurate and complete.    Marcille Buffy, [place] [phone] (phone) [phone] (fax)  [place]

## 2020-02-28 ENCOUNTER — Other Ambulatory Visit: Payer: Self-pay

## 2020-02-28 ENCOUNTER — Ambulatory Visit (INDEPENDENT_AMBULATORY_CARE_PROVIDER_SITE_OTHER): Payer: Medicare Other

## 2020-02-28 DIAGNOSIS — Z0000 Encounter for general adult medical examination without abnormal findings: Secondary | ICD-10-CM

## 2020-02-28 NOTE — Patient Instructions (Signed)
Timothy Powell , Thank you for taking time to come for your Medicare Wellness Visit. I appreciate your ongoing commitment to your health goals. Please review the following plan we discussed and let me know if I can assist you in the future.   Screening recommendations/referrals: Recommended yearly ophthalmology/optometry visit for glaucoma screening and checkup Recommended yearly dental visit for hygiene and checkup  Vaccinations: Influenza vaccine: Due fall 2021 Tdap vaccine: Currently due, declined today.    Advanced directives: Advance directive discussed with you today. Even though you declined this today please call our office should you change your mind and we can give you the proper paperwork for you to fill out.  Conditions/risks identified: Recommend yearly eye exams. Continue a healthy diet and routine exercise.  Next appointment: None, declined scheduling a follow up with PCP at this time or an AWV for 2022.   Preventive Care 40-64 Years, Male Preventive care refers to lifestyle choices and visits with your health care provider that can promote health and wellness. What does preventive care include?  A yearly physical exam. This is also called an annual well check.  Dental exams once or twice a year.  Routine eye exams. Ask your health care provider how often you should have your eyes checked.  Personal lifestyle choices, including:  Daily care of your teeth and gums.  Regular physical activity.  Eating a healthy diet.  Avoiding tobacco and drug use.  Limiting alcohol use.  Practicing safe sex.  Taking low-dose aspirin every day starting at age 59. What happens during an annual well check? The services and screenings done by your health care provider during your annual well check will depend on your age, overall health, lifestyle risk factors, and family history of disease. Counseling  Your health care provider may ask you questions about your:  Alcohol  use.  Tobacco use.  Drug use.  Emotional well-being.  Home and relationship well-being.  Sexual activity.  Eating habits.  Work and work Statistician. Screening  You may have the following tests or measurements:  Height, weight, and BMI.  Blood pressure.  Lipid and cholesterol levels. These may be checked every 5 years, or more frequently if you are over 58 years old.  Skin check.  Lung cancer screening. You may have this screening every year starting at age 24 if you have a 30-pack-year history of smoking and currently smoke or have quit within the past 15 years.  Fecal occult blood test (FOBT) of the stool. You may have this test every year starting at age 85.  Flexible sigmoidoscopy or colonoscopy. You may have a sigmoidoscopy every 5 years or a colonoscopy every 10 years starting at age 22.  Prostate cancer screening. Recommendations will vary depending on your family history and other risks.  Hepatitis C blood test.  Hepatitis B blood test.  Sexually transmitted disease (STD) testing.  Diabetes screening. This is done by checking your blood sugar (glucose) after you have not eaten for a while (fasting). You may have this done every 1-3 years. Discuss your test results, treatment options, and if necessary, the need for more tests with your health care provider. Vaccines  Your health care provider may recommend certain vaccines, such as:  Influenza vaccine. This is recommended every year.  Tetanus, diphtheria, and acellular pertussis (Tdap, Td) vaccine. You may need a Td booster every 10 years.  Zoster vaccine. You may need this after age 75.  Pneumococcal 13-valent conjugate (PCV13) vaccine. You may need this if  you have certain conditions and have not been vaccinated.  Pneumococcal polysaccharide (PPSV23) vaccine. You may need one or two doses if you smoke cigarettes or if you have certain conditions. Talk to your health care provider about which screenings  and vaccines you need and how often you need them. This information is not intended to replace advice given to you by your health care provider. Make sure you discuss any questions you have with your health care provider. Document Released: 08/17/2015 Document Revised: 04/09/2016 Document Reviewed: 05/22/2015 Elsevier Interactive Patient Education  2017 [place] Prevention in the Home Falls can cause injuries. They can happen to people of all ages. There are many things you can do to make your home safe and to help prevent falls. What can I do on the outside of my home?  Regularly fix the edges of walkways and driveways and fix any cracks.  Remove anything that might make you trip as you walk through a door, such as a raised step or threshold.  Trim any bushes or trees on the path to your home.  Use bright outdoor lighting.  Clear any walking paths of anything that might make someone trip, such as rocks or tools.  Regularly check to see if handrails are loose or broken. Make sure that both sides of any steps have handrails.  Any raised decks and porches should have guardrails on the edges.  Have any leaves, snow, or ice cleared regularly.  Use sand or salt on walking paths during winter.  Clean up any spills in your garage right away. This includes oil or grease spills. What can I do in the bathroom?  Use night lights.  Install grab bars by the toilet and in the tub and shower. Do not use towel bars as grab bars.  Use non-skid mats or decals in the tub or shower.  If you need to sit down in the shower, use a plastic, non-slip stool.  Keep the floor dry. Clean up any water that spills on the floor as soon as it happens.  Remove soap buildup in the tub or shower regularly.  Attach bath mats securely with double-sided non-slip rug tape.  Do not have throw rugs and other things on the floor that can make you trip. What can I do in the bedroom?  Use night  lights.  Make sure that you have a light by your bed that is easy to reach.  Do not use any sheets or blankets that are too big for your bed. They should not hang down onto the floor.  Have a firm chair that has side arms. You can use this for support while you get dressed.  Do not have throw rugs and other things on the floor that can make you trip. What can I do in the kitchen?  Clean up any spills right away.  Avoid walking on wet floors.  Keep items that you use a lot in easy-to-reach places.  If you need to reach something above you, use a strong step stool that has a grab bar.  Keep electrical cords out of the way.  Do not use floor polish or wax that makes floors slippery. If you must use wax, use non-skid floor wax.  Do not have throw rugs and other things on the floor that can make you trip. What can I do with my stairs?  Do not leave any items on the stairs.  Make sure that there are handrails on both  sides of the stairs and use them. Fix handrails that are broken or loose. Make sure that handrails are as long as the stairways.  Check any carpeting to make sure that it is firmly attached to the stairs. Fix any carpet that is loose or worn.  Avoid having throw rugs at the top or bottom of the stairs. If you do have throw rugs, attach them to the floor with carpet tape.  Make sure that you have a light switch at the top of the stairs and the bottom of the stairs. If you do not have them, ask someone to add them for you. What else can I do to help prevent falls?  Wear shoes that:  Do not have high heels.  Have rubber bottoms.  Are comfortable and fit you well.  Are closed at the toe. Do not wear sandals.  If you use a stepladder:  Make sure that it is fully opened. Do not climb a closed stepladder.  Make sure that both sides of the stepladder are locked into place.  Ask someone to hold it for you, if possible.  Clearly mark and make sure that you can  see:  Any grab bars or handrails.  First and last steps.  Where the edge of each step is.  Use tools that help you move around (mobility aids) if they are needed. These include:  Canes.  Walkers.  Scooters.  Crutches.  Turn on the lights when you go into a dark area. Replace any light bulbs as soon as they burn out.  Set up your furniture so you have a clear path. Avoid moving your furniture around.  If any of your floors are uneven, fix them.  If there are any pets around you, be aware of where they are.  Review your medicines with your doctor. Some medicines can make you feel dizzy. This can increase your chance of falling. Ask your doctor what other things that you can do to help prevent falls. This information is not intended to replace advice given to you by your health care provider. Make sure you discuss any questions you have with your health care provider. Document Released: 05/17/2009 Document Revised: 12/27/2015 Document Reviewed: 08/25/2014 Elsevier Interactive Patient Education  2017 [employer].

## 2020-02-28 NOTE — Progress Notes (Signed)
Subjective:   Timothy Powell is a 44 y.o. male who presents for an Initial Medicare Annual Wellness Visit.  I connected with Timothy Powell today by telephone and verified that I am speaking with the correct person using two identifiers. Location patient: home Location provider: work Persons participating in the virtual visit: patient, provider.   I discussed the limitations, risks, security and privacy concerns of performing an evaluation and management service by telephone and the availability of in person appointments. I also discussed with the patient that there may be a patient responsible charge related to this service. The patient expressed understanding and verbally consented to this telephonic visit.    Interactive audio and video telecommunications were attempted between this provider and patient, however failed, due to patient having technical difficulties OR patient did not have access to video capability.  We continued and completed visit with audio only.   Review of Systems    N/A  Cardiac Risk Factors include: advanced age (>1men, >30 women);male gender;hypertension     Objective:    There were no vitals filed for this visit. There is no height or weight on file to calculate BMI.  Advanced Directives 02/28/2020 06/13/2019 12/16/2018 03/17/2017  Does Patient Have a Medical Advance Directive? No No No No  Would patient like information on creating a medical advance directive? No - Patient declined - - Yes (MAU/Ambulatory/Procedural Areas - Information given)    Current Medications (verified) Outpatient Encounter Medications as of 02/28/2020  Medication Sig  . ALPRAZolam (XANAX) 0.5 MG tablet Take 1 tablet (0.5 mg total) by mouth at bedtime as needed for anxiety.  . magnesium oxide (MAG-OX) 400 MG tablet Take 400 mg by mouth daily.  . Misc Natural Products (GLUCOSAMINE CHOND COMPLEX/MSM) TABS Take 2 tablets by mouth daily.  . Multiple Vitamin (MULTI-VITAMINS) TABS Take 2  tablets by mouth daily. [place] MEGA MEN  . predniSONE (STERAPRED UNI-PAK 21 TAB) 10 MG (21) TBPK tablet PO: Take 6 tablets on day 1:Take 5 tablets day 2:Take 4 tablets day 3: Take 3 tablets day 4:Take 2 tablets day five: 5 Take 1 tablet day 6  . Testosterone (FORTESTA) 10 MG/ACT (2%) GEL Apply 7 pumps total on thighs as directed once a day  . hydrochlorothiazide (HYDRODIURIL) 12.5 MG tablet Take 1 tablet (12.5 mg total) by mouth daily. (Patient not taking: Reported on 10/17/2019)  . testosterone cypionate (DEPOTESTOTERONE CYPIONATE) 100 MG/ML injection Inject 100 mg into the muscle every 14 (fourteen) days. For IM use only (Patient not taking: Reported on 02/28/2020)   No facility-administered encounter medications on file as of 02/28/2020.    Allergies (verified) Patient has no known allergies.   History: Past Medical History:  Diagnosis Date  . Cauda equina syndrome ([place])   . Hypertension   . Hypogonadism in male    Past Surgical History:  Procedure Laterality Date  . ankle muscle and skin graft Right 1997  . ANTERIOR CERVICAL DECOMP/DISCECTOMY FUSION N/A 03/23/2017   Procedure: Anterior Cervical Decompresion/Discectomy Fusion - Cervical Four-Cervical five - Cervical five-Cervical six - Cervical six-Cervical seven;  Surgeon: [name], MD;  Location: [facility];  Service: Neurosurgery;  Laterality: N/A;  . ANTERIOR CERVICAL DECOMP/DISCECTOMY FUSION N/A 06/15/2019   Procedure: Cervical seven Thoracic one Anterior cervical decompression/discectomy/fusion with removal of atlantis cervical plate;  Surgeon: [name], MD;  Location: [facility];  Service: Neurosurgery;  Laterality: N/A;  . BACK SURGERY  1997   fusion l4/5,  . ELBOW SURGERY Right 2017  repair tear  . 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 removal of skin and muscle graft Right 1997   infection  . repair bicep tear Right 2015  . SHOULDER SURGERY  2011  . skin grafts Right    right ankle and arm   Family History  Problem  Relation Age of Onset  . Heart disease Maternal Grandfather   . Diabetes Paternal Grandmother   . Pancreatic cancer Paternal Grandmother   . Melanoma Mother   . Anuerysm Father   . Thyroid disease Neg Hx    Social History   Socioeconomic History  . Marital status: Single    Spouse name: Not on file  . Number of children: 1  . Years of education: Not on file  . Highest education level: Bachelor's degree (e.g., BA, AB, BS)  Occupational History  . Occupation: retired  Tobacco Use  . Smoking status: Never Smoker  . Smokeless tobacco: Never Used  Vaping Use  . Vaping Use: Never used  Substance and Sexual Activity  . Alcohol use: Yes    Alcohol/week: 0.0 standard drinks    Comment: social/monthly  . Drug use: No  . Sexual activity: Not on file  Other Topics Concern  . Not on file  Social History Narrative  . Not on file   Social Determinants of Health   Financial Resource Strain: Low Risk   . Difficulty of Paying Living Expenses: Not hard at all  Food Insecurity: No Food Insecurity  . Worried About Charity fundraiser in the Last Year: Never true  . Ran Out of Food in the Last Year: Never true  Transportation Needs: No Transportation Needs  . Lack of Transportation (Medical): No  . Lack of Transportation (Non-Medical): No  Physical Activity: Sufficiently Active  . Days of Exercise per Week: 5 days  . Minutes of Exercise per Session: 120 min  Stress: No Stress Concern Present  . Feeling of Stress : Not at all  Social Connections: Socially Isolated  . Frequency of Communication with Friends and Family: More than three times a week  . Frequency of Social Gatherings with Friends and Family: More than three times a week  . Attends Religious Services: Never  . Active Member of Clubs or Organizations: No  . Attends Archivist Meetings: Never  . Marital Status: Never married    Tobacco Counseling Counseling given: Not Answered   Clinical Intake:  Pre-visit  preparation completed: Yes  Pain : No/denies pain     Nutritional Risks: None Diabetes: No  How often do you need to have someone help you when you read instructions, pamphlets, or other written materials from your doctor or pharmacy?: 1 - Never  Diabetic? No  Interpreter Needed?: No  Information entered by :: [facility], LPN   Activities of Daily Living In your present state of health, do you have any difficulty performing the following activities: 02/28/2020 09/14/2019  Hearing? N Y  Vision? N N  Difficulty concentrating or making decisions? N N  Walking or climbing stairs? N N  Dressing or bathing? N N  Doing errands, shopping? N N  Preparing Food and eating ? N -  Using the Toilet? N -  In the past six months, have you accidently leaked urine? N -  Do you have problems with loss of bowel control? N -  Managing your Medications? N -  Managing your Finances? N -  Housekeeping or managing your Housekeeping? N -  Some recent data might be hidden    Patient Care Team: Flinchum, Kelby Aline, FNP as PCP - General (Family Medicine) Altheimer, Legrand Como, MD as Referring Physician (Endocrinology)  Indicate any recent Medical Services you may have received from other than [facility] providers in the past year (date may be approximate).     Assessment:   This is a routine wellness examination for [place].  Hearing/Vision screen No exam data present  Dietary issues and exercise activities discussed: Current Exercise Habits: Home exercise routine, Type of exercise: strength training/weights (cross fit), Time (Minutes): > 60, Frequency (Times/Week): 5, Weekly Exercise (Minutes/Week): 0, Intensity: Intense, Exercise limited by: None identified  Goals    . Obtain Annual Eye (retinal)  Exam      Recommend to complete an eye exam yearly.      Depression Screen PHQ 2/9 Scores 02/28/2020 09/14/2019  PHQ - 2 Score 0 0  PHQ- 9 Score - 0    Fall Risk Fall Risk  02/28/2020 09/14/2019  01/20/2018 12/02/2017  Falls in the past year? 0 0 Yes Yes  Number falls in past yr: 0 0 2 or more 2 or more  Injury with Fall? 0 0 No No  Risk Factor Category  - - High Fall Risk High Fall Risk  Risk for fall due to : - - Impaired balance/gait;Impaired mobility Impaired mobility;Impaired balance/gait;History of fall(s)    Any stairs in or around the home? Yes  If so, are there any without handrails? No  Home free of loose throw rugs in walkways, pet beds, electrical cords, etc? Yes  Adequate lighting in your home to reduce risk of falls? Yes   ASSISTIVE DEVICES UTILIZED TO PREVENT FALLS:  Life alert? No  Use of a cane, walker or w/c? No  Grab bars in the bathroom? No  Shower chair or bench in shower? No  Elevated toilet seat or a handicapped toilet? No    Cognitive Function: Declined today.        Immunizations  There is no immunization history on file for this patient.  TDAP status: Due, Education has been provided regarding the importance of this vaccine. Advised may receive this vaccine at local pharmacy or Health Dept. Aware to provide a copy of the vaccination record if obtained from local pharmacy or Health Dept. Verbalized acceptance and understanding. Flu Vaccine status: Due fall 2021 Covid-19 vaccine status: Declined, Education has been provided regarding the importance of this vaccine but patient still declined. Advised may receive this vaccine at local pharmacy or Health Dept.or vaccine clinic. Aware to provide a copy of the vaccination record if obtained from local pharmacy or Health Dept. Verbalized acceptance and understanding.  Qualifies for Shingles Vaccine? No     Screening Tests Health Maintenance  Topic Date Due  . COVID-19 Vaccine (1) 03/15/2020 (Originally 11/03/1986)  . HIV Screening  10/11/2020 (Originally 11/02/1989)  . TETANUS/TDAP  02/27/2021 (Originally 11/02/1993)  . INFLUENZA VACCINE  03/04/2020    Health Maintenance  There are no preventive care  reminders to display for this patient.   Lung Cancer Screening: (Low Dose CT Chest recommended if Age 68-80 years, 30 pack-year currently smoking OR have quit w/in 15years.) does not qualify.   Additional Screening:  Vision Screening: Recommended annual ophthalmology exams for early detection of glaucoma and other disorders of the eye. Is the patient up to date with their annual eye exam? No, pt declines setting up an eye exam. Who is the provider or what is the name of  the office in which the patient attends annual eye exams? N/A If pt is not established with a provider, would they like to be referred to a provider to establish care? No, declined.   Dental Screening: Recommended annual dental exams for proper oral hygiene  Community Resource Referral / Chronic Care Management: CRR required this visit?  No   CCM required this visit?  No      Plan:     I have personally reviewed and noted the following in the patient's chart:   . Medical and social history . Use of alcohol, tobacco or illicit drugs  . Current medications and supplements . Functional ability and status . Nutritional status . Physical activity . Advanced directives . List of other physicians . Hospitalizations, surgeries, and ER visits in previous 12 months . Vitals . Screenings to include cognitive, depression, and falls . Referrals and appointments  In addition, I have reviewed and discussed with patient certain preventive protocols, quality metrics, and best practice recommendations. A written personalized care plan for preventive services as well as general preventive health recommendations were provided to patient.     Nayla Dias [place], [place]   [id number]/[id number]   Nurse Notes: Declined receiving a future Covid vaccine.

## 2020-03-18 ENCOUNTER — Other Ambulatory Visit: Payer: Self-pay | Admitting: Adult Health

## 2020-03-18 DIAGNOSIS — G4700 Insomnia, unspecified: Secondary | ICD-10-CM

## 2020-03-18 NOTE — Telephone Encounter (Signed)
Requested medication (s) are due for refill today: yes  Requested medication (s) are on the active medication list: no  Last refill:  02/14/20  Future visit scheduled: no  Notes to clinic:  med expired 03/15/20   Requested Prescriptions  Pending Prescriptions Disp Refills   ALPRAZolam (XANAX) 0.5 MG tablet [Pharmacy Med Name: ALPRAZOLAM 0.5MG  TABLETS] 30 tablet     Sig: TAKE 1 TABLET(0.5 MG) BY MOUTH AT BEDTIME AS NEEDED FOR ANXIETY      Not Delegated - Psychiatry:  Anxiolytics/Hypnotics Failed - 03/18/2020  8:28 AM      Failed - This refill cannot be delegated      Failed - Urine Drug Screen completed in last 360 days.      Passed - Valid encounter within last 6 months    Recent Outpatient Visits           1 month ago Tendonitis- left arm.    [place], FNP   4 months ago Appointment canceled by patient    [place], FNP   5 months ago Hypertension, unspecified type   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, Kelby Aline, FNP

## 2020-03-19 NOTE — Telephone Encounter (Signed)
Patient of Timothy Powell, please review and approve refill request patient was last seen in office to address insomnia on 02/14/20, patient taking Alprazolam to help control symptom of insomnia. KW

## 2020-04-10 DIAGNOSIS — M546 Pain in thoracic spine: Secondary | ICD-10-CM | POA: Diagnosis not present

## 2020-04-18 DIAGNOSIS — M5124 Other intervertebral disc displacement, thoracic region: Secondary | ICD-10-CM | POA: Diagnosis not present

## 2020-04-18 DIAGNOSIS — M546 Pain in thoracic spine: Secondary | ICD-10-CM | POA: Diagnosis not present

## 2020-04-23 ENCOUNTER — Other Ambulatory Visit: Payer: Self-pay | Admitting: Family Medicine

## 2020-04-23 DIAGNOSIS — G4700 Insomnia, unspecified: Secondary | ICD-10-CM

## 2020-04-23 NOTE — Telephone Encounter (Signed)
Requested medications are due for refill today?  Yes  - This medication refill cannot be delegated.    Requested medications are on active medication list?  Yes  Last Refill:   03/19/2020  # 30 with no refills  Future visit scheduled?   No   Notes to Clinic:  This medication refill cannot be delegated.

## 2020-04-25 NOTE — Telephone Encounter (Signed)
Please review request. KW 

## 2020-05-31 ENCOUNTER — Other Ambulatory Visit: Payer: Self-pay | Admitting: Adult Health

## 2020-05-31 DIAGNOSIS — G4700 Insomnia, unspecified: Secondary | ICD-10-CM

## 2020-05-31 NOTE — Telephone Encounter (Signed)
Requested medication (s) are due for refill today: yes  Requested medication (s) are on the active medication list: yes  Last refill:  04/25/20  Future visit scheduled: no  Notes to clinic:  not delegated    Requested Prescriptions  Pending Prescriptions Disp Refills   ALPRAZolam (XANAX) 0.5 MG tablet [Pharmacy Med Name: ALPRAZOLAM 0.5MG  TABLETS] 30 tablet     Sig: TAKE 1 TABLET(0.5 MG) BY MOUTH AT BEDTIME AS NEEDED FOR ANXIETY      Not Delegated - Psychiatry:  Anxiolytics/Hypnotics Failed - 05/31/2020  8:47 AM      Failed - This refill cannot be delegated      Failed - Urine Drug Screen completed in last 360 days.      Passed - Valid encounter within last 6 months    Recent Outpatient Visits           3 months ago Tendonitis- left arm.    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

## 2020-06-01 ENCOUNTER — Encounter: Payer: Self-pay | Admitting: Adult Health

## 2020-06-01 ENCOUNTER — Other Ambulatory Visit: Payer: Self-pay | Admitting: Adult Health

## 2020-06-01 DIAGNOSIS — E039 Hypothyroidism, unspecified: Secondary | ICD-10-CM | POA: Diagnosis not present

## 2020-06-01 DIAGNOSIS — E230 Hypopituitarism: Secondary | ICD-10-CM | POA: Diagnosis not present

## 2020-06-01 DIAGNOSIS — E063 Autoimmune thyroiditis: Secondary | ICD-10-CM | POA: Diagnosis not present

## 2020-06-01 DIAGNOSIS — G4700 Insomnia, unspecified: Secondary | ICD-10-CM

## 2020-06-01 MED ORDER — ALPRAZOLAM 0.5 MG PO TABS: 0.5000 mg | ORAL_TABLET | Freq: Every evening | ORAL | 0 refills | Status: DC | PRN

## 2020-06-06 DIAGNOSIS — E039 Hypothyroidism, unspecified: Secondary | ICD-10-CM | POA: Diagnosis not present

## 2020-06-06 DIAGNOSIS — E063 Autoimmune thyroiditis: Secondary | ICD-10-CM | POA: Diagnosis not present

## 2020-06-06 DIAGNOSIS — E230 Hypopituitarism: Secondary | ICD-10-CM | POA: Diagnosis not present

## 2020-07-31 ENCOUNTER — Other Ambulatory Visit: Payer: Self-pay | Admitting: Adult Health

## 2020-07-31 DIAGNOSIS — G4700 Insomnia, unspecified: Secondary | ICD-10-CM

## 2020-07-31 NOTE — Telephone Encounter (Signed)
Requested medication (s) are due for refill today: yes  Requested medication (s) are on the active medication list: yes  Last refill: 06/01/2020  Future visit scheduled: no  Notes to clinic:  this refill cannot be delegated    Requested Prescriptions  Pending Prescriptions Disp Refills   ALPRAZolam (XANAX) 0.5 MG tablet [Pharmacy Med Name: ALPRAZOLAM 0.5MG  TABLETS] 60 tablet     Sig: TAKE 1 TABLET(0.5 MG) BY MOUTH AT BEDTIME AS NEEDED FOR ANXIETY      Not Delegated - Psychiatry:  Anxiolytics/Hypnotics Failed - 07/31/2020 12:58 PM      Failed - This refill cannot be delegated      Failed - Urine Drug Screen completed in last 360 days      Passed - Valid encounter within last 6 months    Recent Outpatient Visits           5 months ago Tendonitis- left arm.    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

## 2020-09-05 ENCOUNTER — Other Ambulatory Visit: Payer: Self-pay

## 2020-09-05 ENCOUNTER — Encounter: Payer: Self-pay | Admitting: Adult Health

## 2020-09-05 ENCOUNTER — Ambulatory Visit (INDEPENDENT_AMBULATORY_CARE_PROVIDER_SITE_OTHER): Payer: Medicare Other | Admitting: Adult Health

## 2020-09-05 VITALS — BP 159/111 | HR 78 | Temp 98.1°F | Resp 16 | Wt 216.8 lb

## 2020-09-05 DIAGNOSIS — I10 Essential (primary) hypertension: Secondary | ICD-10-CM | POA: Diagnosis not present

## 2020-09-05 DIAGNOSIS — Z8249 Family history of ischemic heart disease and other diseases of the circulatory system: Secondary | ICD-10-CM | POA: Diagnosis not present

## 2020-09-05 DIAGNOSIS — R7871 Abnormal lead level in blood: Secondary | ICD-10-CM | POA: Diagnosis not present

## 2020-09-05 DIAGNOSIS — M25552 Pain in left hip: Secondary | ICD-10-CM | POA: Diagnosis not present

## 2020-09-05 MED ORDER — AMLODIPINE BESYLATE 5 MG PO TABS: 5.0000 mg | ORAL_TABLET | Freq: Every day | ORAL | 1 refills | Status: DC

## 2020-09-05 NOTE — Patient Instructions (Addendum)
Hypertension, Adult Hypertension is another name for high blood pressure. High blood pressure forces your heart to work harder to pump blood. This can cause problems over time. There are two numbers in a blood pressure reading. There is a top number (systolic) over a bottom number (diastolic). It is best to have a blood pressure that is below 120/80. Healthy choices can help lower your blood pressure, or you may need medicine to help lower it. What are the causes? The cause of this condition is not known. Some conditions may be related to high blood pressure. What increases the risk?  Smoking.  Having type 2 diabetes mellitus, high cholesterol, or both.  Not getting enough exercise or physical activity.  Being overweight.  Having too much fat, sugar, calories, or salt (sodium) in your diet.  Drinking too much alcohol.  Having long-term (chronic) kidney disease.  Having a family history of high blood pressure.  Age. Risk increases with age.  Race. You may be at higher risk if you are African American.  Gender. Men are at higher risk than women before age 45. After age 65, women are at higher risk than men.  Having obstructive sleep apnea.  Stress. What are the signs or symptoms?  High blood pressure may not cause symptoms. Very high blood pressure (hypertensive crisis) may cause: ? Headache. ? Feelings of worry or nervousness (anxiety). ? Shortness of breath. ? Nosebleed. ? A feeling of being sick to your stomach (nausea). ? Throwing up (vomiting). ? Changes in how you see. ? Very bad chest pain. ? Seizures. How is this treated?  This condition is treated by making healthy lifestyle changes, such as: ? Eating healthy foods. ? Exercising more. ? Drinking less alcohol.  Your health care provider may prescribe medicine if lifestyle changes are not enough to get your blood pressure under control, and if: ? Your top number is above 130. ? Your bottom number is above  80.  Your personal target blood pressure may vary. Follow these instructions at home: Eating and drinking  If told, follow the DASH eating plan. To follow this plan: ? Fill one half of your plate at each meal with fruits and vegetables. ? Fill one fourth of your plate at each meal with whole grains. Whole grains include whole-wheat pasta, brown rice, and whole-grain bread. ? Eat or drink low-fat dairy products, such as skim milk or low-fat yogurt. ? Fill one fourth of your plate at each meal with low-fat (lean) proteins. Low-fat proteins include fish, chicken without skin, eggs, beans, and tofu. ? Avoid fatty meat, cured and processed meat, or chicken with skin. ? Avoid pre-made or processed food.  Eat less than 1,500 mg of salt each day.  Do not drink alcohol if: ? Your doctor tells you not to drink. ? You are pregnant, may be pregnant, or are planning to become pregnant.  If you drink alcohol: ? Limit how much you use to:  0-1 drink a day for women.  0-2 drinks a day for men. ? Be aware of how much alcohol is in your drink. In the [place] one drink equals one 12 oz bottle of beer (355 mL), one 5 oz glass of wine (148 mL), or one 1 oz glass of hard liquor (44 mL).   Lifestyle  Work with your doctor to stay at a healthy weight or to lose weight. Ask your doctor what the best weight is for you.  Get at least 30 minutes of exercise most   days of the week. This may include walking, swimming, or biking.  Get at least 30 minutes of exercise that strengthens your muscles (resistance exercise) at least 3 days a week. This may include lifting weights or doing Pilates.  Do not use any products that contain nicotine or tobacco, such as cigarettes, e-cigarettes, and chewing tobacco. If you need help quitting, ask your doctor.  Check your blood pressure at home as told by your doctor.  Keep all follow-up visits as told by your doctor. This is important.   Medicines  Take over-the-counter  and prescription medicines only as told by your doctor. Follow directions carefully.  Do not skip doses of blood pressure medicine. The medicine does not work as well if you skip doses. Skipping doses also puts you at risk for problems.  Ask your doctor about side effects or reactions to medicines that you should watch for. Contact a doctor if you:  Think you are having a reaction to the medicine you are taking.  Have headaches that keep coming back (recurring).  Feel dizzy.  Have swelling in your ankles.  Have trouble with your vision. Get help right away if you:  Get a very bad headache.  Start to feel mixed up (confused).  Feel weak or numb.  Feel faint.  Have very bad pain in your: ? Chest. ? Belly (abdomen).  Throw up more than once.  Have trouble breathing. Summary  Hypertension is another name for high blood pressure.  High blood pressure forces your heart to work harder to pump blood.  For most people, a normal blood pressure is less than 120/80.  Making healthy choices can help lower blood pressure. If your blood pressure does not get lower with healthy choices, you may need to take medicine. This information is not intended to replace advice given to you by your health care provider. Make sure you discuss any questions you have with your health care provider. Document Revised: 03/31/2018 Document Reviewed: 03/31/2018 Elsevier Patient Education  2021 [place]. Amlodipine Tablets What is this medicine? AMLODIPINE (am LOE di peen) is a calcium channel blocker. It relaxes your blood vessels and decreases the amount of work the heart has to do. It treats high blood pressure and/or prevents chest pain (also called angina). This medicine may be used for other purposes; ask your health care provider or pharmacist if you have questions. COMMON BRAND NAME(S): Norvasc What should I tell my health care provider before I take this medicine? They need to know if  you have any of these conditions:  heart disease  liver disease  an unusual or allergic reaction to amlodipine, other drugs, foods, dyes, or preservatives  pregnant or trying to get pregnant  breast-feeding How should I use this medicine? Take this medicine by mouth. Take it as directed on the prescription label at the same time every day. You can take it with or without food. If it upsets your stomach, take it with food. Keep taking it unless your health care provider tells you to stop. Talk to your health care provider about the use of this medicine in children. While it may be prescribed for children as young as 6 for selected conditions, precautions do apply. Overdosage: If you think you have taken too much of this medicine contact a poison control center or emergency room at once. NOTE: This medicine is only for you. Do not share this medicine with others. What if I miss a dose? If you miss a dose,  take it as soon as you can. If it is almost time for your next dose, take only that dose. Do not take double or extra doses. What may interact with this medicine? This medicine may interact with the following medications:  clarithromycin  cyclosporine  diltiazem  itraconazole  simvastatin  tacrolimus This list may not describe all possible interactions. Give your health care provider a list of all the medicines, herbs, non-prescription drugs, or dietary supplements you use. Also tell them if you smoke, drink alcohol, or use illegal drugs. Some items may interact with your medicine. What should I watch for while using this medicine? Visit your health care provider for regular checks on your progress. Check your blood pressure as directed. Ask your health care provider what your blood pressure should be. Also, find out when you should contact him or her. Do not treat yourself for coughs, colds, or pain while you are using this medicine without asking your health care provider for  advice. Some medicines may increase your blood pressure. You may get drowsy or dizzy. Do not drive, use machinery, or do anything that needs mental alertness until you know how this medicine affects you. Do not stand up or sit up quickly, especially if you are an older patient. This reduces the risk of dizzy or fainting spells. Alcohol can make you more drowsy and dizzy. Avoid alcoholic drinks. What side effects may I notice from receiving this medicine? Side effects that you should report to your doctor or health care provider as soon as possible:  allergic reactions (skin rash, itching or hives; swelling of the face, lips, or tongue)  heart attack (trouble breathing; pain or tightness in the chest, neck, back or arms; unusually weak or tired)  low blood pressure (dizziness; feeling faint or lightheaded, falls; unusually weak or tired) Side effects that usually do not require medical attention (report these to your doctor or health care provider if they continue or are bothersome):  facial flushing  nausea  palpitations  stomach pain  sudden weight gain  swelling of the ankles, feet, hands This list may not describe all possible side effects. Call your doctor for medical advice about side effects. You may report side effects to FDA at 1-800-FDA-1088. Where should I keep my medicine? Keep out of the reach of children and pets. Store at room temperature between 20 and 25 degrees C (68 and 77 degrees F). Protect from light and moisture. Keep the container tightly closed. Get rid of any unused medicine after the expiration date. To get rid of medicines that are no longer needed or have expired:  Take the medicine to a medicine take-back program. Check with your pharmacy or law enforcement to find a location.  If you cannot return the medicine, check the label or package insert to see if the medicine should be thrown out in the garbage or flushed down the toilet. If you are not sure, ask  your health care provider. If it is safe to put in the trash, empty the medicine out of the container. Mix the medicine with cat litter, dirt, coffee grounds, or other unwanted substance. Seal the mixture in a bag or container. Put it in the trash. NOTE: This sheet is a summary. It may not cover all possible information. If you have questions about this medicine, talk to your doctor, pharmacist, or health care provider.  2021 Elsevier/Gold Standard (2020-06-16 14:59:47)   Hypertension, Adult High blood pressure (hypertension) is when the force of blood  pumping through the arteries is too strong. The arteries are the blood vessels that carry blood from the heart throughout the body. Hypertension forces the heart to work harder to pump blood and may cause arteries to become narrow or stiff. Untreated or uncontrolled hypertension can cause a heart attack, heart failure, a stroke, kidney disease, and other problems. A blood pressure reading consists of a higher number over a lower number. Ideally, your blood pressure should be below 120/80. The first ("top") number is called the systolic pressure. It is a measure of the pressure in your arteries as your heart beats. The second ("bottom") number is called the diastolic pressure. It is a measure of the pressure in your arteries as the heart relaxes. What are the causes? The exact cause of this condition is not known. There are some conditions that result in or are related to high blood pressure. What increases the risk? Some risk factors for high blood pressure are under your control. The following factors may make you more likely to develop this condition:  Smoking.  Having type 2 diabetes mellitus, high cholesterol, or both.  Not getting enough exercise or physical activity.  Being overweight.  Having too much fat, sugar, calories, or salt (sodium) in your diet.  Drinking too much alcohol. Some risk factors for high blood pressure may be  difficult or impossible to change. Some of these factors include:  Having chronic kidney disease.  Having a family history of high blood pressure.  Age. Risk increases with age.  Race. You may be at higher risk if you are African American.  Gender. Men are at higher risk than women before age 78. After age 65, women are at higher risk than men.  Having obstructive sleep apnea.  Stress. What are the signs or symptoms? High blood pressure may not cause symptoms. Very high blood pressure (hypertensive crisis) may cause:  Headache.  Anxiety.  Shortness of breath.  Nosebleed.  Nausea and vomiting.  Vision changes.  Severe chest pain.  Seizures. How is this diagnosed? This condition is diagnosed by measuring your blood pressure while you are seated, with your arm resting on a flat surface, your legs uncrossed, and your feet flat on the floor. The cuff of the blood pressure monitor will be placed directly against the skin of your upper arm at the level of your heart. It should be measured at least twice using the same arm. Certain conditions can cause a difference in blood pressure between your right and left arms. Certain factors can cause blood pressure readings to be lower or higher than normal for a short period of time:  When your blood pressure is higher when you are in a health care provider's office than when you are at home, this is called white coat hypertension. Most people with this condition do not need medicines.  When your blood pressure is higher at home than when you are in a health care provider's office, this is called masked hypertension. Most people with this condition may need medicines to control blood pressure. If you have a high blood pressure reading during one visit or you have normal blood pressure with other risk factors, you may be asked to:  Return on a different day to have your blood pressure checked again.  Monitor your blood pressure at home for  1 week or longer. If you are diagnosed with hypertension, you may have other blood or imaging tests to help your health care provider understand your overall  risk for other conditions. How is this treated? This condition is treated by making healthy lifestyle changes, such as eating healthy foods, exercising more, and reducing your alcohol intake. Your health care provider may prescribe medicine if lifestyle changes are not enough to get your blood pressure under control, and if:  Your systolic blood pressure is above 130.  Your diastolic blood pressure is above 80. Your personal target blood pressure may vary depending on your medical conditions, your age, and other factors. Follow these instructions at home: Eating and drinking  Eat a diet that is high in fiber and potassium, and low in sodium, added sugar, and fat. An example eating plan is called the DASH (Dietary Approaches to Stop Hypertension) diet. To eat this way: ? Eat plenty of fresh fruits and vegetables. Try to fill one half of your plate at each meal with fruits and vegetables. ? Eat whole grains, such as whole-wheat pasta, brown rice, or whole-grain bread. Fill about one fourth of your plate with whole grains. ? Eat or drink low-fat dairy products, such as skim milk or low-fat yogurt. ? Avoid fatty cuts of meat, processed or cured meats, and poultry with skin. Fill about one fourth of your plate with lean proteins, such as fish, chicken without skin, beans, eggs, or tofu. ? Avoid pre-made and processed foods. These tend to be higher in sodium, added sugar, and fat.  Reduce your daily sodium intake. Most people with hypertension should eat less than 1,500 mg of sodium a day.  Do not drink alcohol if: ? Your health care provider tells you not to drink. ? You are pregnant, may be pregnant, or are planning to become pregnant.  If you drink alcohol: ? Limit how much you use to:  0-1 drink a day for women.  0-2 drinks a day for  men. ? Be aware of how much alcohol is in your drink. In the [place] one drink equals one 12 oz bottle of beer (355 mL), one 5 oz glass of wine (148 mL), or one 1 oz glass of hard liquor (44 mL).   Lifestyle  Work with your health care provider to maintain a healthy body weight or to lose weight. Ask what an ideal weight is for you.  Get at least 30 minutes of exercise most days of the week. Activities may include walking, swimming, or biking.  Include exercise to strengthen your muscles (resistance exercise), such as Pilates or lifting weights, as part of your weekly exercise routine. Try to do these types of exercises for 30 minutes at least 3 days a week.  Do not use any products that contain nicotine or tobacco, such as cigarettes, e-cigarettes, and chewing tobacco. If you need help quitting, ask your health care provider.  Monitor your blood pressure at home as told by your health care provider.  Keep all follow-up visits as told by your health care provider. This is important.   Medicines  Take over-the-counter and prescription medicines only as told by your health care provider. Follow directions carefully. Blood pressure medicines must be taken as prescribed.  Do not skip doses of blood pressure medicine. Doing this puts you at risk for problems and can make the medicine less effective.  Ask your health care provider about side effects or reactions to medicines that you should watch for. Contact a health care provider if you:  Think you are having a reaction to a medicine you are taking.  Have headaches that keep coming back (recurring).  Feel dizzy.  Have swelling in your ankles.  Have trouble with your vision. Get help right away if you:  Develop a severe headache or confusion.  Have unusual weakness or numbness.  Feel faint.  Have severe pain in your chest or abdomen.  Vomit repeatedly.  Have trouble breathing. Summary  Hypertension is when the force of blood  pumping through your arteries is too strong. If this condition is not controlled, it may put you at risk for serious complications.  Your personal target blood pressure may vary depending on your medical conditions, your age, and other factors. For most people, a normal blood pressure is less than 120/80.  Hypertension is treated with lifestyle changes, medicines, or a combination of both. Lifestyle changes include losing weight, eating a healthy, low-sodium diet, exercising more, and limiting alcohol. This information is not intended to replace advice given to you by your health care provider. Make sure you discuss any questions you have with your health care provider. Document Revised: 03/31/2018 Document Reviewed: 03/31/2018 Elsevier Patient Education  2021 [place].     Why follow it? Research shows. . Those who follow the Mediterranean diet have a reduced risk of heart disease  . The diet is associated with a reduced incidence of Parkinson's and Alzheimer's diseases . People following the diet may have longer life expectancies and lower rates of chronic diseases  . The Dietary Guidelines for Americans recommends the Mediterranean diet as an eating plan to promote health and prevent disease  What Is the Mediterranean Diet?  . Healthy eating plan based on typical foods and recipes of Mediterranean-style cooking . The diet is primarily a plant based diet; these foods should make up a majority of meals   Starches - Plant based foods should make up a majority of meals - They are an important sources of vitamins, minerals, energy, antioxidants, and fiber - Choose whole grains, foods high in fiber and minimally processed items  - Typical grain sources include wheat, oats, barley, corn, brown rice, bulgar, farro, millet, polenta, couscous  - Various types of beans include chickpeas, lentils, fava beans, black beans, white beans   Fruits  Veggies - Large quantities of antioxidant rich fruits  & veggies; 6 or more servings  - Vegetables can be eaten raw or lightly drizzled with oil and cooked  - Vegetables common to the traditional Mediterranean Diet include: artichokes, arugula, beets, broccoli, brussel sprouts, cabbage, carrots, celery, collard greens, cucumbers, eggplant, kale, leeks, lemons, lettuce, mushrooms, okra, onions, peas, peppers, potatoes, pumpkin, radishes, rutabaga, shallots, spinach, sweet potatoes, turnips, zucchini - Fruits common to the Mediterranean Diet include: apples, apricots, avocados, cherries, clementines, dates, figs, grapefruits, grapes, melons, nectarines, oranges, peaches, pears, pomegranates, strawberries, tangerines  Fats - Replace butter and margarine with healthy oils, such as olive oil, canola oil, and tahini  - Limit nuts to no more than a handful a day  - Nuts include walnuts, almonds, pecans, pistachios, pine nuts  - Limit or avoid candied, honey roasted or heavily salted nuts - Olives are central to the [employer] - can be eaten whole or used in a variety of dishes   Meats Protein - Limiting red meat: no more than a few times a month - When eating red meat: choose lean cuts and keep the portion to the size of deck of cards - Eggs: approx. 0 to 4 times a week  - Fish and lean poultry: at least 2 a week  - Healthy protein sources include,  chicken, [place], lean beef, lamb - Increase intake of seafood such as tuna, salmon, trout, mackerel, shrimp, scallops - Avoid or limit high fat processed meats such as sausage and bacon  Dairy - Include moderate amounts of low fat dairy products  - Focus on healthy dairy such as fat free yogurt, skim milk, low or reduced fat cheese - Limit dairy products higher in fat such as whole or 2% milk, cheese, ice cream  Alcohol - Moderate amounts of red wine is ok  - No more than 5 oz daily for women (all ages) and men older than age 28  - No more than 10 oz of wine daily for men younger than 49  Other - Limit  sweets and other desserts  - Use herbs and spices instead of salt to flavor foods  - Herbs and spices common to the traditional Mediterranean Diet include: basil, bay leaves, chives, cloves, cumin, fennel, garlic, lavender, marjoram, mint, oregano, parsley, pepper, rosemary, sage, savory, sumac, tarragon, thyme   It's not just a diet, it's a lifestyle:  . The Mediterranean diet includes lifestyle factors typical of those in the region  . Foods, drinks and meals are best eaten with others and savored . Daily physical activity is important for overall good health . This could be strenuous exercise like running and aerobics . This could also be more leisurely activities such as walking, housework, yard-work, or taking the stairs . Moderation is the key; a balanced and healthy diet accommodates most foods and drinks . Consider portion sizes and frequency of consumption of certain foods   Meal Ideas & Options:  . Breakfast:  o Whole wheat toast or whole wheat English muffins with peanut butter & hard boiled egg o Steel cut oats topped with apples & cinnamon and skim milk  o Fresh fruit: banana, strawberries, melon, berries, peaches  o Smoothies: strawberries, bananas, greek yogurt, peanut butter o Low fat greek yogurt with blueberries and granola  o Egg white omelet with spinach and mushrooms o Breakfast couscous: whole wheat couscous, apricots, skim milk, cranberries  . Sandwiches:  o Hummus and grilled vegetables (peppers, zucchini, squash) on whole wheat bread   o Grilled chicken on whole wheat pita with lettuce, tomatoes, cucumbers or tzatziki  o Tuna salad on whole wheat bread: tuna salad made with greek yogurt, olives, red peppers, capers, green onions o Garlic rosemary lamb pita: lamb sauted with garlic, rosemary, salt & pepper; add lettuce, cucumber, greek yogurt to pita - flavor with lemon juice and black pepper  . Seafood:  o Mediterranean grilled salmon, seasoned with garlic,  basil, parsley, lemon juice and black pepper o Shrimp, lemon, and spinach whole-grain pasta salad made with low fat greek yogurt  o Seared scallops with lemon orzo  o Seared tuna steaks seasoned salt, pepper, coriander topped with tomato mixture of olives, tomatoes, olive oil, minced garlic, parsley, green onions and cappers  . Meats:  o Herbed greek chicken salad with kalamata olives, cucumber, feta  o Red bell peppers stuffed with spinach, bulgur, lean ground beef (or lentils) & topped with feta   o Kebabs: skewers of chicken, tomatoes, onions, zucchini, squash  o [place] burgers: made with red onions, mint, dill, lemon juice, feta cheese topped with roasted red peppers . Vegetarian o Cucumber salad: cucumbers, artichoke hearts, celery, red onion, feta cheese, tossed in olive oil & lemon juice  o Hummus and whole grain pita points with a greek salad (lettuce, tomato, feta, olives, cucumbers, red  onion) o Lentil soup with celery, carrots made with vegetable broth, garlic, salt and pepper  o Tabouli salad: parsley, bulgur, mint, scallions, cucumbers, tomato, radishes, lemon juice, olive oil, salt and pepper.       [email or web address].[email or web address]">  Plan de alimentacin DASH DASH Eating Plan DASH es la sigla en ingls de "Enfoques Alimentarios para Detener la Hipertensin". El plan de alimentacin DASH ha demostrado:  Bajar la presin arterial elevada (hipertensin).  Reducir el riesgo de diabetes tipo 2, enfermedad cardaca y accidente cerebrovascular.  Ayudar a perder peso. Consejos para seguir Consulting civil engineer las etiquetas de los alimentos  Verifique la cantidad de sal (sodio) por porcin en las etiquetas de los alimentos. Elija alimentos con menos del 5 por ciento del valor diario de sodio. Generalmente, los alimentos con menos de 300 miligramos (mg) de sodio por porcin se encuadran dentro de este plan alimentario.  Para encontrar cereales integrales,  busque la palabra "integral" como primera palabra en la lista de ingredientes. Al ir de compras  Compre productos en los que en su etiqueta diga: "bajo contenido de sodio" o "sin agregado de sal".  Compre alimentos frescos. Evite los alimentos enlatados y comidas precocidas o congeladas. Al cocinar  Evite agregar sal cuando cocine. Use hierbas o aderezos sin sal, en lugar de sal de mesa o sal marina. Consulte al mdico o farmacutico antes de usar sustitutos de la sal.  No fra los alimentos. A la hora de cocinar los alimentos opte por hornearlos, hervirlos, grillarlos, asarlos al horno y asarlos a Patent attorney.  Cocine con aceites cardiosaludables, como oliva, canola, aguacate, soja o girasol. Planificacin de las comidas  Consuma una dieta equilibrada, que incluya lo siguiente: ? 4o ms porciones de frutas y 4 o ms porciones de Warehouse manager. Trate de que medio plato de cada comida sea de frutas y verduras. ? De 6 a 8porciones de cereales [employer]. ? Menos de 6 onzas (170g) de carne, aves o pescado Copy. Una porcin de 3 onzas (85g) de carne tiene casi el mismo tamao que un mazo de cartas. Un huevo equivale a 1 onza (28g). ? De 2 a 3 porciones de productos lcteos descremados por da. Una porcin es 1taza ( ). ? 1 porcin de frutos secos, semillas o frijoles 5 veces por semana. ? De 2 a 3 porciones de grasas cardiosaludables. Las grasas saludables llamadas cidos grasos omega-3 se encuentran en alimentos como las nueces, las semillas de [place], las leches fortificadas y [place]. Estas grasas tambin se encuentran en los pescados de agua fra, como la sardina, el salmn y la caballa.  Limite la cantidad que consume de: ? Alimentos enlatados o envasados. ? Alimentos con alto contenido de grasa trans, como algunos alimentos fritos. ? Alimentos con alto contenido de grasa saturada, como carne con grasa. ? Postres y otros dulces, bebidas azucaradas y  otros alimentos con azcar agregada. ? Productos lcteos enteros.  No le agregue sal a los alimentos antes de probarlos.  No coma ms de 4 yemas de huevo por semana.  Trate de comer al menos 2 comidas vegetarianas por semana.  Consuma ms comida casera y menos de restaurante, de bares y comida rpida.   Estilo de vida  Cuando coma en un restaurante, pida que preparen su comida con menos sal o, en lo posible, sin nada de sal.  Si bebe alcohol: ? Limite la cantidad que bebe:  De 0 a 1 medida por da para las mujeres  que no estn embarazadas.  De 0 a 2 medidas por da para los hombres. ? Est atento a la cantidad de alcohol que hay en las bebidas que toma. En los [place], una medida equivale a una botella de cerveza de 12oz (375ml), un vaso de vino de 5oz (138ml) o un vaso de una bebida alcohlica de alta graduacin de 1oz (44ml). Informacin general  Evite ingerir ms de 2300 mg de sal por da. Si tiene hipertensin, es posible que necesite reducir la ingesta de sodio a 1,500 mg por da.  Trabaje con su mdico para mantener un peso saludable o perder [employer]. Pregntele cul es el peso recomendado para usted.  Realice al menos 30 minutos de ejercicio que haga que se acelere su corazn (ejercicio Arboriculturist) la [employer] de la [place]. Estas actividades pueden incluir caminar, nadar o andar en bicicleta.  Trabaje con su mdico o nutricionista para ajustar su plan alimentario a sus necesidades calricas personales. Qu alimentos debo comer? Frutas Todas las frutas frescas, congeladas o disecadas. Frutas enlatadas en jugo natural (sin agregado de azcar). Verduras Verduras frescas o congeladas (crudas, al vapor, asadas o grilladas). Jugos de tomate y verduras con bajo contenido de sodio o reducidos en sodio. Salsa y pasta de tomate con bajo contenido de sodio o reducidas en sodio. Verduras enlatadas con bajo contenido de sodio o reducidas en sodio. Granos Pan de  salvado o integral. Pasta de salvado o integral. Arroz integral. Avena. Quinua. Trigo burgol. Cereales integrales y con bajo contenido de sodio. Pan pita. Galletitas de [place] con bajo contenido de [place] y [place]. Tortillas de [place] integral. Carnes y otras protenas Pollo o pavo sin piel. Carne de pollo o de [place]. Cerdo desgrasado. Pescado y [employer]. Claras de huevo. Porotos, guisantes o lentejas secos. Frutos secos, mantequilla de frutos secos y semillas sin sal. Frijoles enlatados sin sal. Cortes de carne vacuna magra, desgrasada. Carne precocida o curada magra y baja en sodio, como embutidos o panes de carne. Lcteos Leche descremada (1%) o descremada. Quesos reducidos en grasa, con bajo contenido de grasa o descremados. Queso blanco o ricota sin grasa, con bajo contenido de [place]. Yogur semidescremado o descremado. Queso con bajo contenido de [place] y [place]. Grasas y [employer] untables que no contengan grasas trans. Aceite vegetal. Lubertha Basque y aderezos para ensaladas livianos, reducidos en grasa o con bajo contenido de grasas (reducidos en sodio). Aceite de canola, crtamo, oliva, aguacate, soja y [place]. Agu[place]. Alios y condimentos Hierbas. Especias. Mezclas de condimentos sin sal. Otros alimentos Palomitas de maz y pretzels sin sal. Dulces con bajo contenido de grasas. Es posible que los productos que se enumeran ms [place] no constituyan una lista completa de los alimentos y las bebidas que puede tomar. Consulte a un nutricionista para obtener ms informacin. Qu alimentos debo evitar? Lambert Mody Fruta enlatada en almbar liviano o espeso. Frutas cocidas en aceite. Frutas con salsa de crema o mantequilla. Verduras Verduras con crema o fritas. Verduras en [place]. Verduras enlatadas regulares (que no sean con bajo contenido de sodio o reducidas en sodio). Pasta y salsa de tomates enlatadas regulares (que no sean con bajo contenido de sodio o reducidas en sodio). Jugos  de tomate y verduras regulares (que no sean con bajo contenido de sodio o reducidos en sodio). Pepinillos. Aceitunas. Granos Productos de panificacin hechos con grasa, como medialunas, magdalenas y algunos panes. Comidas con arroz o pasta seca listas para usar. Carnes y otras protenas [place]  con alto contenido de [place]. Costillas. Carne frita. [place]. [place], salame y otras carnes precocidas o curadas, como embutidos o panes de carne. Grasa de la espalda del cerdo (panceta). [place]. Frutos secos y semillas con sal. Frijoles enlatados con agregado de sal. Pescado enlatado o ahumado. Huevos enteros o yemas. Pollo o pavo con piel. Lcteos Leche entera o al 2%, crema y mitad leche y mitad crema. Queso crema entero o con toda su grasa. Yogur entero o endulzado. Quesos con toda su grasa. Sustitutos de cremas no lcteas. Coberturas batidas. Quesos para untar y quesos procesados. Grasas y [employer]. Margarina en barra. [place]. Lardo. Mantequilla clarificada. Grasa de panceta. Aceites tropicales como aceite de coco, palmiste o palma. Alios y condimentos Sal de cebolla, sal de ajo, sal condimentada, sal de mesa y sal marina. Salsa [place]. Salsa trtara. Salsa barbacoa. Salsa teriyaki. Salsa de soja, incluso la que tiene contenido reducido de [place]. Salsa de carne. Salsas en lata y envasadas. Salsa de pescado. Salsa de [place]. Salsa rosada. Rbanos picantes comprados en tiendas. [place]. [place]. Saborizantes y tiernizantes para carne. Caldo en cubitos. Salsas picantes. Adobos preelaborados o envasados. Aderezos para tacos preelaborados o envasados. Salsas de pepinillos. Aderezos comunes para ensalada. Otros alimentos Palomitas de maz y pretzels con sal. Es posible que los productos que se enumeran ms arriba no constituyan una lista completa de los alimentos y las bebidas que Nurse, adult. Consulte a un nutricionista para obtener ms informacin. Dnde  buscar ms informacin  National Heart, Lung, and Blood Institute ([place], los Pulmones y Herbalist): https://wilson-eaton.com/  American Heart Association (Asociacin Estadounidense del Corazn): www.heart.org  Academy of Nutrition and Dietetics (Academia de Nutricin y Information systems manager): www.eatright.[place]): www.kidney.org Resumen  El plan de alimentacin DASH ha demostrado bajar la presin arterial elevada (hipertensin). Tambin puede reducir [employer] de diabetes tipo 2, enfermedad cardaca y accidente cerebrovascular.  Cuando siga el plan de alimentacin DASH, trate de comer ms frutas frescas y verduras, cereales integrales, carnes magras, lcteos descremados y grasas cardiosaludables.  Con el plan de alimentacin DASH, deber limitar el consumo de sal (sodio) a 2,300 mg por da. Si tiene hipertensin, es posible que necesite reducir la ingesta de sodio a 1,500 mg por da.  Trabaje con su mdico o nutricionista para ajustar su plan alimentario a sus necesidades calricas personales. Esta informacin no tiene Marine scientist el consejo del mdico. Asegrese de hacerle al mdico cualquier pregunta que tenga. Document Revised: 08/25/2019 Document Reviewed: 08/25/2019 Elsevier Patient Education  2021 [employer].

## 2020-09-05 NOTE — Progress Notes (Signed)
Established patient visit   Patient: Timothy Powell   DOB: March 11, 1976   45 y.o. Male  MRN: [id number] Visit Date: 09/05/2020  Today's healthcare provider: Marcille Buffy, FNP   Chief Complaint  Patient presents with  . Hypertension   Subjective    HPI  Hypertension, follow-up  BP Readings from Last 3 Encounters:  09/05/20 (!) 159/111  02/14/20 (!) 140/100  10/17/19 136/90   Wt Readings from Last 3 Encounters:  09/05/20 216 lb 12.8 oz (98.3 kg)  02/14/20 210 lb 9.6 oz (95.5 kg)  10/17/19 205 lb 3.2 oz (93.1 kg)     He was last seen for hypertension 11 months ago.  BP at that visit was 128/98. Management since that visit includes continuing Losartan 50mg . He stopped taking due to fatigue. He also was previously started on HCTZ 25 mg and discontinued due to leg cramps he reports.  He is active exercises regularly.   He reports fair compliance with treatment.Patient has been off medication he reports for several months He is having side effects. Patient reports when he was on medication he felt fatigued He is following a Regular diet. He is exercising. He does not smoke.  Use of agents associated with hypertension: NSAIDS.  Father had stent cardiac in his 68's. Dad died of aneurysms at age 107.   Mom has history of melanoma -still  living. , benitiz graham. Is his dermatologist.   Outside blood pressures are systolic 956 diastolic >213. Symptoms: No chest pain No chest pressure  No palpitations No syncope  No dyspnea No orthopnea  No paroxysmal nocturnal dyspnea No lower extremity edema   Pertinent labs: Lab Results  Component Value Date   CHOL 167 08/23/2019   HDL 30 (L) 08/23/2019   LDLCALC 117 (H) 08/23/2019   TRIG 111 08/23/2019   Lab Results  Component Value Date   NA 139 08/23/2019   K 4.9 08/23/2019   CREATININE 1.34 (H) 08/23/2019   GFRNONAA 64 08/23/2019   GFRAA 74 08/23/2019   GLUCOSE 95 08/23/2019     The 10-year ASCVD risk score  Mikey Bussing DC Jr., et al., 2013) is: 4.8%   Patient  denies any fever, body aches,chills, rash, chest pain, shortness of breath, nausea, vomiting, or diarrhea.  Denies dizziness, lightheadedness, pre syncopal or syncopal episodes.   He was previously sent to cardiology for evaluation at his request but did not get scheduled, refferal notes were reviewed.  He is wanting to see orthopedics Dr. Rolena Infante in [place] for chronic history left hip pain, sciatica to left leg, declined any imaging today, he has seen sports medicine in  and wants a new ref feral because his provider left.   He has endocrinologist and is seeing them for testosterone management.   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  ---------------------------------------------------------------------------------------------------  Patient Active Problem List   Diagnosis Date Noted  . Actinic keratosis 10/13/2019  . Long-term current use of testosterone replacement therapy 10/12/2019  . Abnormal lead level in blood 10/12/2019  . Hypertension 09/14/2019  . Insomnia 09/14/2019  . High risk medication use 09/14/2019  . Spinal surgery in prior 3 months- 06/24/19 cervical  09/14/2019  . Elevated testosterone level in male 09/14/2019  . Glenoid labral tear, left, initial encounter 06/15/2018  . Cauda equina syndrome ([place]) 12/02/2017  . Sacral pain 12/02/2017  .  Neuropathic pain 12/02/2017  . Body mass index (bmi) 28.0-28.9, adult 10/21/2017  . Elevated blood pressure reading without diagnosis of hypertension 10/21/2017  . Foot drop, left foot 10/21/2017  . Muscle atrophy of lower extremity 10/21/2017  . Pain in thoracic spine 10/21/2017  . HNP (herniated nucleus pulposus), cervical 03/23/2017  . Nonallopathic lesion of lumbosacral region 11/18/2016  . Piriformis syndrome of right side 11/03/2016  . Degenerative cervical  disc 10/16/2016  . Nonallopathic lesion of cervical region 10/16/2016  . Nonallopathic lesion of thoracic region 10/16/2016  . Nonallopathic lesion of sacral region 10/16/2016  . Paraparesis ([place]) 10/16/2016  . Chronic autoimmune thyroiditis 09/11/2016  . Hypothyroidism 09/11/2016  . Lateral epicondylitis of right elbow 10/23/2015  . 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 Elevated blood pressure 08/25/2011  . Generalized anxiety disorder 04/19/2009  . Renal function test abnormal 11/29/2007   Past Medical History:  Diagnosis Date  . Cauda equina syndrome ([place])   . Hypertension   . Hypogonadism in male    No Known Allergies     Medications: Outpatient Medications Prior to Visit  Medication Sig  . ALPRAZolam (XANAX) 0.5 MG tablet Take 1 tablet (0.5 mg total) by mouth at bedtime as needed for anxiety. (Patient not taking: Reported on 09/05/2020)  . magnesium oxide (MAG-OX) 400 MG tablet Take 400 mg by mouth daily. (Patient not taking: Reported on 09/05/2020)  . Misc Natural Products (GLUCOSAMINE CHOND COMPLEX/MSM) TABS Take 2 tablets by mouth daily. (Patient not taking: Reported on 09/05/2020)  . Multiple Vitamin (MULTI-VITAMINS) TABS Take 2 tablets by mouth daily. GNC MEGA MEN (Patient not taking: Reported on 09/05/2020)  . testosterone cypionate (DEPOTESTOTERONE CYPIONATE) 100 MG/ML injection Inject 100 mg into the muscle every 14 (fourteen) days. For IM use only (Patient not taking: No sig reported)  . [DISCONTINUED] hydrochlorothiazide (HYDRODIURIL) 12.5 MG tablet Take 1 tablet (12.5 mg total) by mouth daily. (Patient not taking: No sig reported)  . [DISCONTINUED] predniSONE (STERAPRED UNI-PAK 21 TAB) 10 MG (21) TBPK tablet PO: Take 6 tablets on day 1:Take 5 tablets day 2:Take 4 tablets day 3: Take 3 tablets day 4:Take 2 tablets day five: 5 Take 1 tablet day 6 (Patient not taking: Reported on 09/05/2020)  . [DISCONTINUED] Testosterone 10 MG/ACT (2%) GEL Apply 7 pumps total on thighs  as directed once a day (Patient not taking: Reported on 09/05/2020)   No facility-administered medications prior to visit.    Review of Systems  Constitutional: Negative for activity change, appetite change, chills, diaphoresis, fatigue, fever and unexpected weight change.  HENT: Negative.   Respiratory: Negative.   Cardiovascular: Negative.   Gastrointestinal: Negative.   Genitourinary: Negative.   Musculoskeletal: Positive for arthralgias and back pain. Negative for neck pain and neck stiffness.  Skin: Negative for color change, pallor, rash and wound.  Neurological: Positive for numbness (left leg intermittent and left buttocks, hip pain. ). Negative for dizziness, tremors, seizures, syncope, facial asymmetry, speech difficulty, weakness, light-headedness and headaches.  Hematological: Negative.   Psychiatric/Behavioral: The patient is nervous/anxious.     Last CBC Lab Results  Component Value Date   WBC 5.3 08/23/2019   HGB 16.6 08/23/2019   HCT 49.6 08/23/2019   MCV 90 08/23/2019   MCH 30.1 08/23/2019   RDW 12.8 08/23/2019   PLT 265 0000000   Last metabolic panel Lab Results  Component Value Date   GLUCOSE 95 08/23/2019   NA 139 08/23/2019   K 4.9 08/23/2019   CL 101 08/23/2019  CO2 26 08/23/2019   BUN 14 08/23/2019   CREATININE 1.34 (H) 08/23/2019   GFRNONAA 64 08/23/2019   GFRAA 74 08/23/2019   CALCIUM 9.5 08/23/2019   PROT 6.4 08/23/2019   ALBUMIN 4.4 08/23/2019   LABGLOB 2.0 08/23/2019   AGRATIO 2.2 08/23/2019   BILITOT 0.5 08/23/2019   ALKPHOS 54 08/23/2019   AST 24 08/23/2019   ALT 30 08/23/2019   ANIONGAP 8 06/13/2019   Last lipids Lab Results  Component Value Date   CHOL 167 08/23/2019   HDL 30 (L) 08/23/2019   LDLCALC 117 (H) 08/23/2019   TRIG 111 08/23/2019   Last hemoglobin A1c No results found for: HGBA1C Last thyroid functions Lab Results  Component Value Date   TSH 13.500 (H) 08/23/2019   T4TOTAL 5.3 08/23/2019   Last vitamin  D Lab Results  Component Value Date   VD25OH 56.9 08/23/2019   Last vitamin B12 and Folate No results found for: VITAMINB12, FOLATE     Objective    BP (!) 159/111   Pulse 78   Temp 98.1 F (36.7 C) (Oral)   Resp 16   Wt 216 lb 12.8 oz (98.3 kg)   SpO2 95%   BMI 31.11 kg/m  BP Readings from Last 3 Encounters:  09/05/20 (!) 159/111  02/14/20 (!) 140/100  10/17/19 136/90   Wt Readings from Last 3 Encounters:  09/05/20 216 lb 12.8 oz (98.3 kg)  02/14/20 210 lb 9.6 oz (95.5 kg)  10/17/19 205 lb 3.2 oz (93.1 kg)       Physical Exam Vitals reviewed.  Constitutional:      General: He is not in acute distress.    Appearance: Normal appearance. He is not ill-appearing, toxic-appearing or diaphoretic.  HENT:     Head: Normocephalic and atraumatic.     Right Ear: Tympanic membrane, ear canal and external ear normal. There is no impacted cerumen.     Left Ear: Tympanic membrane, ear canal and external ear normal. There is no impacted cerumen.     Nose: Nose normal.     Mouth/Throat:     Mouth: Mucous membranes are moist.     Pharynx: No oropharyngeal exudate or posterior oropharyngeal erythema.  Eyes:     General: No scleral icterus.       Right eye: No discharge.        Left eye: No discharge.     Extraocular Movements: Extraocular movements intact.     Pupils: Pupils are equal, round, and reactive to light.  Cardiovascular:     Rate and Rhythm: Normal rate and regular rhythm.     Pulses: Normal pulses.     Heart sounds: Normal heart sounds. No murmur heard. No gallop.   Pulmonary:     Effort: Pulmonary effort is normal. No respiratory distress.     Breath sounds: Normal breath sounds. No wheezing, rhonchi or rales.  Chest:     Chest wall: No tenderness.  Abdominal:     Palpations: Abdomen is soft.  Musculoskeletal:        General: Tenderness (left buttock. ) present. No swelling, deformity or signs of injury. Normal range of motion.     Cervical back: Normal  range of motion.     Right lower leg: No edema.     Left lower leg: No edema.  Lymphadenopathy:     Cervical: No cervical adenopathy.  Skin:    Findings: No erythema.  Neurological:     Mental Status: He is alert  and oriented to person, place, and time.     Motor: No weakness.     Gait: Gait normal.  Psychiatric:        Mood and Affect: Mood normal.        Behavior: Behavior normal.        Thought Content: Thought content normal.        Judgment: Judgment normal.      No results found for any visits on 09/05/20.  Assessment & Plan     Hypertension, unspecified type - Plan: Ambulatory referral to Cardiology, amLODipine (NORVASC) 5 MG tablet, CBC with Differential/Platelet, Comprehensive Metabolic Panel (CMET)  Elevated blood lead level- histoy of  - Plan: Lead, blood (adult age 25 yrs or greater)  Left hip pain - Plan: Ambulatory referral to Orthopedic Surgery  Family history of abdominal aortic aneurysm  Family history of coronary artery disease  Uncontrolled hypertension   Meds ordered this encounter  Medications  . amLODipine (NORVASC) 5 MG tablet    Sig: Take 1 tablet (5 mg total) by mouth daily.    Dispense:  90 tablet    Refill:  1    Red Flags discussed. The patient was given clear instructions to go to ER or return to medical center if any red flags develop, symptoms do not improve, worsen or new problems develop. They verbalized understanding.  Recheck blood pressure uncontrolled hypertension trial of norvasc has discontinued two orther agents.  Family history in father of cardiac artery disease and aneurysm at early age will have cardiology evaluate, 2nd referral placed.   Piliformis syndrome expected, declines treatment wants to see Dr. Rolena Infante orthopedics.  Call if not heard from both referrals within 2 weeks is advised.   Keep endocrinology follow up for testosterone management and PSA checks.  Return in about 1 month (around 10/03/2020), or if symptoms  worsen or fail to improve, for at any time for any worsening symptoms, Go to Emergency room/ urgent care if worse.      The entirety of the information documented in the History of Present Illness, Review of Systems and Physical Exam were personally obtained by me. Portions of this information were initially documented by the CMA and reviewed by me for thoroughness and accuracy.      Marcille Buffy, [place] [phone] (phone) [phone] (fax)  [place]

## 2020-09-06 ENCOUNTER — Ambulatory Visit: Payer: Medicare Other | Admitting: Adult Health

## 2020-09-06 LAB — COMPREHENSIVE METABOLIC PANEL
ALT: 37 IU/L (ref 0–44)
AST: 36 IU/L (ref 0–40)
Albumin/Globulin Ratio: 1.9 (ref 1.2–2.2)
Albumin: 4.5 g/dL (ref 4.0–5.0)
Alkaline Phosphatase: 63 IU/L (ref 44–121)
BUN/Creatinine Ratio: 15 (ref 9–20)
BUN: 22 mg/dL (ref 6–24)
Bilirubin Total: 0.6 mg/dL (ref 0.0–1.2)
CO2: 26 mmol/L (ref 20–29)
Calcium: 9.4 mg/dL (ref 8.7–10.2)
Chloride: 100 mmol/L (ref 96–106)
Creatinine, Ser: 1.44 mg/dL — ABNORMAL HIGH (ref 0.76–1.27)
GFR calc Af Amer: 67 mL/min/{1.73_m2} (ref >59)
GFR calc non Af Amer: 58 mL/min/{1.73_m2} — ABNORMAL LOW (ref >59)
Globulin, Total: 2.4 g/dL (ref 1.5–4.5)
Glucose: 91 mg/dL (ref 65–99)
Potassium: 5 mmol/L (ref 3.5–5.2)
Sodium: 137 mmol/L (ref 134–144)
Total Protein: 6.9 g/dL (ref 6.0–8.5)

## 2020-09-06 LAB — CBC WITH DIFFERENTIAL/PLATELET
Basophils Absolute: 0.1 10*3/uL (ref 0.0–0.2)
Basos: 1 %
EOS (ABSOLUTE): 0.2 10*3/uL (ref 0.0–0.4)
Eos: 2 %
Hematocrit: 51.1 % — ABNORMAL HIGH (ref 37.5–51.0)
Hemoglobin: 17.3 g/dL (ref 13.0–17.7)
Immature Grans (Abs): 0 10*3/uL (ref 0.0–0.1)
Immature Granulocytes: 0 %
Lymphocytes Absolute: 1.4 10*3/uL (ref 0.7–3.1)
Lymphs: 22 %
MCH: 30.5 pg (ref 26.6–33.0)
MCHC: 33.9 g/dL (ref 31.5–35.7)
MCV: 90 fL (ref 79–97)
Monocytes Absolute: 0.6 10*3/uL (ref 0.1–0.9)
Monocytes: 8 %
Neutrophils Absolute: 4.3 10*3/uL (ref 1.4–7.0)
Neutrophils: 67 %
Platelets: 248 10*3/uL (ref 150–450)
RBC: 5.68 x10E6/uL (ref 4.14–5.80)
RDW: 11.6 % (ref 11.6–15.4)
WBC: 6.6 10*3/uL (ref 3.4–10.8)

## 2020-09-06 LAB — LEAD, BLOOD (ADULT >= 16 YRS)

## 2020-09-06 NOTE — Progress Notes (Signed)
Lead test specimen was canceled by lab. See note.  CBC is okay.  CMP-  shows slightly worsening kidney function, hydrate, avoid NSAID'S such as Ibuprofen, aleve and motrin. Uncontrolled hypertension can cause as well, recommend starting anti-hypertensive as prescribed most recent visit. Can be side effect from testosterone he can also discuss with his endocrinologist.  Advised CMP recheck in 3 months.

## 2020-09-10 ENCOUNTER — Ambulatory Visit (INDEPENDENT_AMBULATORY_CARE_PROVIDER_SITE_OTHER): Payer: Medicare Other | Admitting: Cardiology

## 2020-09-10 ENCOUNTER — Encounter: Payer: Self-pay | Admitting: Cardiology

## 2020-09-10 ENCOUNTER — Other Ambulatory Visit: Payer: Self-pay

## 2020-09-10 VITALS — BP 160/120 | HR 80 | Ht 69.0 in | Wt 214.0 lb

## 2020-09-10 DIAGNOSIS — I10 Essential (primary) hypertension: Secondary | ICD-10-CM | POA: Diagnosis not present

## 2020-09-10 DIAGNOSIS — E7800 Pure hypercholesterolemia, unspecified: Secondary | ICD-10-CM | POA: Diagnosis not present

## 2020-09-10 NOTE — Progress Notes (Signed)
Cardiology Office Note:    Date:  09/10/2020   ID:  Timothy Powell, DOB Jun 12, 1976, MRN [id number]  PCP:  Doreen Beam, FNP  [facility] Cardiologist:  Kate Sable, MD  New Site Electrophysiologist:  None   Referring MD: Sharmon Leyden*   Chief Complaint  Patient presents with  . New Patient (Initial Visit)    Referred by PCP for for HTN. Meds reviewed verbally with patient.    History of Present Illness:    Timothy Powell is a 45 y.o. male with a hx of hypertension, hypogonadism who presents due to difficult to control blood pressures.  Has been managed by primary care regarding BP issues for about a year now.  Medications tried include losartan, which was stopped due to fatigue, HCTZ was discontinued due to leg cramping.  Has a family history of CAD with his father having stents in his 39s.  He was started on Norvasc 5 mg daily by primary care provider.  He has not started taking this medication yet.  Past Medical History:  Diagnosis Date  . Cauda equina syndrome ([place])   . Hypertension   . Hypogonadism in male     Past Surgical History:  Procedure Laterality Date  . ankle muscle and skin graft Right 1997  . ANTERIOR CERVICAL DECOMP/DISCECTOMY FUSION N/A 03/23/2017   Procedure: Anterior Cervical Decompresion/Discectomy Fusion - Cervical Four-Cervical five - Cervical five-Cervical six - Cervical six-Cervical seven;  Surgeon: [name], MD;  Location: [facility];  Service: Neurosurgery;  Laterality: N/A;  . ANTERIOR CERVICAL DECOMP/DISCECTOMY FUSION N/A 06/15/2019   Procedure: Cervical seven Thoracic one Anterior cervical decompression/discectomy/fusion with removal of atlantis cervical plate;  Surgeon: [name], MD;  Location: [facility];  Service: Neurosurgery;  Laterality: N/A;  . BACK SURGERY  1997   fusion l4/5,  . ELBOW SURGERY Right 2017   repair tear  . 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 removal of skin and muscle graft Right 1997    infection  . repair bicep tear Right 2015  . SHOULDER SURGERY  2011  . skin grafts Right    right ankle and arm    Current Medications: Current Meds  Medication Sig  . ALPRAZolam (XANAX) 0.5 MG tablet Take 1 tablet (0.5 mg total) by mouth at bedtime as needed for anxiety.  [name] amLODipine (NORVASC) 5 MG tablet Take 1 tablet (5 mg total) by mouth daily.  . magnesium oxide (MAG-OX) 400 MG tablet Take 400 mg by mouth daily.  . Misc Natural Products (GLUCOSAMINE CHOND COMPLEX/MSM) TABS Take 2 tablets by mouth daily.  . Multiple Vitamin (MULTI-VITAMINS) TABS Take 2 tablets by mouth daily. [place] MEGA MEN  . testosterone cypionate (DEPOTESTOTERONE CYPIONATE) 100 MG/ML injection Inject 100 mg into the muscle every 14 (fourteen) days. For IM use only     Allergies:   Patient has no known allergies.   Social History   Socioeconomic History  . Marital status: Single    Spouse name: Not on file  . Number of children: 1  . Years of education: Not on file  . Highest education level: Bachelor's degree (e.g., BA, AB, BS)  Occupational History  . Occupation: retired  Tobacco Use  . Smoking status: Never Smoker  . Smokeless tobacco: Never Used  Vaping Use  . Vaping Use: Never used  Substance and Sexual Activity  . Alcohol use: Yes    Alcohol/week: 0.0 standard drinks    Comment: social/monthly  .  Drug use: No  . Sexual activity: Not on file  Other Topics Concern  . Not on file  Social History Narrative  . Not on file   Social Determinants of Health   Financial Resource Strain: Low Risk   . Difficulty of Paying Living Expenses: Not hard at all  Food Insecurity: No Food Insecurity  . Worried About Charity fundraiser in the Last Year: Never true  . Ran Out of Food in the Last Year: Never true  Transportation Needs: No Transportation Needs  . Lack of Transportation (Medical): No  . Lack of Transportation (Non-Medical): No  Physical Activity: Sufficiently Active  . Days of Exercise per  Week: 5 days  . Minutes of Exercise per Session: 120 min  Stress: No Stress Concern Present  . Feeling of Stress : Not at all  Social Connections: Socially Isolated  . Frequency of Communication with Friends and Family: More than three times a week  . Frequency of Social Gatherings with Friends and Family: More than three times a week  . Attends Religious Services: Never  . Active Member of Clubs or Organizations: No  . Attends Archivist Meetings: Never  . Marital Status: Never married     Family History: The patient's family history includes Anuerysm in his father; Diabetes in his paternal grandmother; Heart disease in his maternal grandfather; Melanoma in his mother; Pancreatic cancer in his paternal grandmother. There is no history of Thyroid disease.  ROS:   Please see the history of present illness.     All other systems reviewed and are negative.  EKGs/Labs/Other Studies Reviewed:    The following studies were reviewed today:   EKG:  EKG is  ordered today.  The ekg ordered today demonstrates sinus rhythm, normal ECG.  Recent Labs: 09/05/2020: ALT 37; BUN 22; Creatinine, Ser 1.44; Hemoglobin 17.3; Platelets 248; Potassium 5.0; Sodium 137  Recent Lipid Panel    Component Value Date/Time   CHOL 167 08/23/2019 0904   TRIG 111 08/23/2019 0904   HDL 30 (L) 08/23/2019 0904   LDLCALC 117 (H) 08/23/2019 0904     Risk Assessment/Calculations:      Physical Exam:    VS:  BP (!) 160/120 (BP Location: Right Arm, Patient Position: Sitting, Cuff Size: Large)   Pulse 80   Ht 5\' 9"  (1.753 m)   Wt 214 lb (97.1 kg)   BMI 31.60 kg/m     Wt Readings from Last 3 Encounters:  09/10/20 214 lb (97.1 kg)  09/05/20 216 lb 12.8 oz (98.3 kg)  02/14/20 210 lb 9.6 oz (95.5 kg)     GEN:  Well nourished, well developed in no acute distress HEENT: Normal NECK: No JVD; No carotid bruits LYMPHATICS: No lymphadenopathy CARDIAC: RRR, no murmurs, rubs, gallops RESPIRATORY:   Clear to auscultation without rales, wheezing or rhonchi  ABDOMEN: Soft, non-tender, non-distended MUSCULOSKELETAL:  No edema; lower extremity muscular atrophy noted below the knees SKIN: Warm and dry NEUROLOGIC:  Alert and oriented x 3 PSYCHIATRIC:  Normal affect   ASSESSMENT:    1. Primary hypertension   2. Pure hypercholesterolemia    PLAN:    In order of problems listed above:  1. Patient with history of hypertension, BP elevated.  Start Norvasc 5 mg daily.  Patient advised on low-salt diet, check BP and keep a log daily at home. 2. Elevated LDL, 10-year ASCVD risk 4.1%.  Patient not in statin benefit group.  Low-cholesterol diet recommended.  Follow-up 1 month.  Medication Adjustments/Labs and Tests Ordered: Current medicines are reviewed at length with the patient today.  Concerns regarding medicines are outlined above.  Orders Placed This Encounter  Procedures  . EKG 12-Lead   No orders of the defined types were placed in this encounter.   Patient Instructions   Medication Instructions:   Your physician recommends that you continue on your current medications as directed. Please refer to the Current Medication list given to you today.  *If you need a refill on your cardiac medications before your next appointment, please call your pharmacy*   Lab Work: None ordered If you have labs (blood work) drawn today and your tests are completely normal, you will receive your results only by: [name] MyChart Message (if you have MyChart) OR . A paper copy in the mail If you have any lab test that is abnormal or we need to change your treatment, we will call you to review the results.   Testing/Procedures: None ordered   Follow-Up: At [facility], you and your health needs are our priority.  As part of our continuing mission to provide you with exceptional heart care, we have created designated Provider Care Teams.  These Care Teams include your primary Cardiologist  (physician) and Advanced Practice Providers (APPs -  Physician Assistants and Nurse Practitioners) who all work together to provide you with the care you need, when you need it.  We recommend signing up for the patient portal called "MyChart".  Sign up information is provided on this After Visit Summary.  MyChart is used to connect with patients for Virtual Visits (Telemedicine).  Patients are able to view lab/test results, encounter notes, upcoming appointments, etc.  Non-urgent messages can be sent to your provider as well.   To learn more about what you can do with MyChart, go to [email or web address].    Your next appointment:   1 month(s)  The format for your next appointment:   In Person  Provider:   Kate Sable, MD   Other Instructions  Call us or send in your BP readings through MyChart in 1 week.     DASH Eating Plan  DASH stands for Dietary Approaches to Stop Hypertension. The DASH eating plan is a healthy eating plan that has been shown to:  Reduce high blood pressure (hypertension).  Reduce your risk for type 2 diabetes, heart disease, and stroke.  Help with weight loss. What are tips for following this plan? Reading food labels  Check food labels for the amount of salt (sodium) per serving. Choose foods with less than 5 percent of the Daily Value of sodium. Generally, foods with less than 300 milligrams (mg) of sodium per serving fit into this eating plan.  To find whole grains, look for the word "whole" as the first word in the ingredient list. Shopping  Buy products labeled as "low-sodium" or "no salt added."  Buy fresh foods. Avoid canned foods and pre-made or frozen meals. Cooking  Avoid adding salt when cooking. Use salt-free seasonings or herbs instead of table salt or sea salt. Check with your health care provider or pharmacist before using salt substitutes.  Do not fry foods. Cook foods using healthy methods such as baking, boiling, grilling,  roasting, and broiling instead.  Cook with heart-healthy oils, such as olive, canola, avocado, soybean, or sunflower oil. Meal planning  Eat a balanced diet that includes: ? 4 or more servings of fruits and 4 or more servings of vegetables each day. Try to fill one-half  of your plate with fruits and vegetables. ? 6-8 servings of whole grains each day. ? Less than 6 oz (170 g) of lean meat, poultry, or fish each day. A 3-oz (85-g) serving of meat is about the same size as a deck of cards. One egg equals 1 oz (28 g). ? 2-3 servings of low-fat dairy each day. One serving is 1 cup (237 mL). ? 1 serving of nuts, seeds, or beans 5 times each week. ? 2-3 servings of heart-healthy fats. Healthy fats called omega-3 fatty acids are found in foods such as walnuts, flaxseeds, fortified milks, and eggs. These fats are also found in cold-water fish, such as sardines, salmon, and mackerel.  Limit how much you eat of: ? Canned or prepackaged foods. ? Food that is high in trans fat, such as some fried foods. ? Food that is high in saturated fat, such as fatty meat. ? Desserts and other sweets, sugary drinks, and other foods with added sugar. ? Full-fat dairy products.  Do not salt foods before eating.  Do not eat more than 4 egg yolks a week.  Try to eat at least 2 vegetarian meals a week.  Eat more home-cooked food and less restaurant, buffet, and fast food.   Lifestyle  When eating at a restaurant, ask that your food be prepared with less salt or no salt, if possible.  If you drink alcohol: ? Limit how much you use to:  0-1 drink a day for women who are not pregnant.  0-2 drinks a day for men. ? Be aware of how much alcohol is in your drink. In the [place] one drink equals one 12 oz bottle of beer (355 mL), one 5 oz glass of wine (148 mL), or one 1 oz glass of hard liquor (44 mL). General information  Avoid eating more than 2,300 mg of salt a day. If you have hypertension, you may need to  reduce your sodium intake to 1,500 mg a day.  Work with your health care provider to maintain a healthy body weight or to lose weight. Ask what an ideal weight is for you.  Get at least 30 minutes of exercise that causes your heart to beat faster (aerobic exercise) most days of the week. Activities may include walking, swimming, or biking.  Work with your health care provider or dietitian to adjust your eating plan to your individual calorie needs. What foods should I eat? Fruits All fresh, dried, or frozen fruit. Canned fruit in natural juice (without added sugar). Vegetables Fresh or frozen vegetables (raw, steamed, roasted, or grilled). Low-sodium or reduced-sodium tomato and vegetable juice. Low-sodium or reduced-sodium tomato sauce and tomato paste. Low-sodium or reduced-sodium canned vegetables. Grains Whole-grain or whole-wheat bread. Whole-grain or whole-wheat pasta. Brown rice. [place]. Bulgur. Whole-grain and low-sodium cereals. Pita bread. Low-fat, low-sodium crackers. Whole-wheat flour tortillas. Meats and other proteins Skinless chicken or [place]. Ground chicken or [place]. Pork with fat trimmed off. Fish and seafood. Egg whites. Dried beans, peas, or lentils. Unsalted nuts, nut butters, and seeds. Unsalted canned beans. Lean cuts of beef with fat trimmed off. Low-sodium, lean precooked or cured meat, such as sausages or meat loaves. Dairy Low-fat (1%) or fat-free (skim) milk. Reduced-fat, low-fat, or fat-free cheeses. Nonfat, low-sodium ricotta or cottage cheese. Low-fat or nonfat yogurt. Low-fat, low-sodium cheese. Fats and oils Soft margarine without trans fats. Vegetable oil. Reduced-fat, low-fat, or light mayonnaise and salad dressings (reduced-sodium). Canola, safflower, olive, avocado, soybean, and sunflower oils. Avocado. Seasonings and  condiments Herbs. Spices. Seasoning mixes without salt. Other foods Unsalted popcorn and pretzels. Fat-free sweets. The items  listed above may not be a complete list of foods and beverages you can eat. Contact a dietitian for more information. What foods should I avoid? Fruits Canned fruit in a light or heavy syrup. Fried fruit. Fruit in cream or butter sauce. Vegetables Creamed or fried vegetables. Vegetables in a cheese sauce. Regular canned vegetables (not low-sodium or reduced-sodium). Regular canned tomato sauce and paste (not low-sodium or reduced-sodium). Regular tomato and vegetable juice (not low-sodium or reduced-sodium). Angie Fava. Olives. Grains Baked goods made with fat, such as croissants, muffins, or some breads. Dry pasta or rice meal packs. Meats and other proteins Fatty cuts of meat. Ribs. Fried meat. Berniece Salines. Bologna, salami, and other precooked or cured meats, such as sausages or meat loaves. Fat from the back of a pig (fatback). Bratwurst. Salted nuts and seeds. Canned beans with added salt. Canned or smoked fish. Whole eggs or egg yolks. Chicken or [place] with skin. Dairy Whole or 2% milk, cream, and half-and-half. Whole or full-fat cream cheese. Whole-fat or sweetened yogurt. Full-fat cheese. Nondairy creamers. Whipped toppings. Processed cheese and cheese spreads. Fats and oils Butter. Stick margarine. Lard. Shortening. Ghee. Bacon fat. Tropical oils, such as coconut, palm kernel, or palm oil. Seasonings and condiments Onion salt, garlic salt, seasoned salt, table salt, and sea salt. Worcestershire sauce. Tartar sauce. Barbecue sauce. Teriyaki sauce. Soy sauce, including reduced-sodium. Steak sauce. Canned and packaged gravies. Fish sauce. Oyster sauce. Cocktail sauce. Store-bought horseradish. Ketchup. Mustard. Meat flavorings and tenderizers. Bouillon cubes. Hot sauces. Pre-made or packaged marinades. Pre-made or packaged taco seasonings. Relishes. Regular salad dressings. Other foods Salted popcorn and pretzels. The items listed above may not be a complete list of foods and beverages you should  avoid. Contact a dietitian for more information. Where to find more information  National Heart, Lung, and Blood Institute: https://wilson-eaton.com/  American Heart Association: www.heart.org  Academy of Nutrition and Dietetics: www.eatright.[place]: www.kidney.org Summary  The DASH eating plan is a healthy eating plan that has been shown to reduce high blood pressure (hypertension). It may also reduce your risk for type 2 diabetes, heart disease, and stroke.  When on the DASH eating plan, aim to eat more fresh fruits and vegetables, whole grains, lean proteins, low-fat dairy, and heart-healthy fats.  With the DASH eating plan, you should limit salt (sodium) intake to 2,300 mg a day. If you have hypertension, you may need to reduce your sodium intake to 1,500 mg a day.  Work with your health care provider or dietitian to adjust your eating plan to your individual calorie needs. This information is not intended to replace advice given to you by your health care provider. Make sure you discuss any questions you have with your health care provider. Document Revised: 06/24/2019 Document Reviewed: 06/24/2019 Elsevier Patient Education  2021 [place].       Signed, Kate Sable, MD  09/10/2020 12:59 PM    [place]

## 2020-09-10 NOTE — Patient Instructions (Signed)
Medication Instructions:   Your physician recommends that you continue on your current medications as directed. Please refer to the Current Medication list given to you today.  *If you need a refill on your cardiac medications before your next appointment, please call your pharmacy*   Lab Work: None ordered If you have labs (blood work) drawn today and your tests are completely normal, you will receive your results only by: [name] MyChart Message (if you have MyChart) OR . A paper copy in the mail If you have any lab test that is abnormal or we need to change your treatment, we will call you to review the results.   Testing/Procedures: None ordered   Follow-Up: At [facility], you and your health needs are our priority.  As part of our continuing mission to provide you with exceptional heart care, we have created designated Provider Care Teams.  These Care Teams include your primary Cardiologist (physician) and Advanced Practice Providers (APPs -  Physician Assistants and Nurse Practitioners) who all work together to provide you with the care you need, when you need it.  We recommend signing up for the patient portal called "MyChart".  Sign up information is provided on this After Visit Summary.  MyChart is used to connect with patients for Virtual Visits (Telemedicine).  Patients are able to view lab/test results, encounter notes, upcoming appointments, etc.  Non-urgent messages can be sent to your provider as well.   To learn more about what you can do with MyChart, go to [email or web address].    Your next appointment:   1 month(s)  The format for your next appointment:   In Person  Provider:   Kate Sable, MD   Other Instructions  Call us or send in your BP readings through MyChart in 1 week.     DASH Eating Plan  DASH stands for Dietary Approaches to Stop Hypertension. The DASH eating plan is a healthy eating plan that has been shown to:  Reduce high blood  pressure (hypertension).  Reduce your risk for type 2 diabetes, heart disease, and stroke.  Help with weight loss. What are tips for following this plan? Reading food labels  Check food labels for the amount of salt (sodium) per serving. Choose foods with less than 5 percent of the Daily Value of sodium. Generally, foods with less than 300 milligrams (mg) of sodium per serving fit into this eating plan.  To find whole grains, look for the word "whole" as the first word in the ingredient list. Shopping  Buy products labeled as "low-sodium" or "no salt added."  Buy fresh foods. Avoid canned foods and pre-made or frozen meals. Cooking  Avoid adding salt when cooking. Use salt-free seasonings or herbs instead of table salt or sea salt. Check with your health care provider or pharmacist before using salt substitutes.  Do not fry foods. Cook foods using healthy methods such as baking, boiling, grilling, roasting, and broiling instead.  Cook with heart-healthy oils, such as olive, canola, avocado, soybean, or sunflower oil. Meal planning  Eat a balanced diet that includes: ? 4 or more servings of fruits and 4 or more servings of vegetables each day. Try to fill one-half of your plate with fruits and vegetables. ? 6-8 servings of whole grains each day. ? Less than 6 oz (170 g) of lean meat, poultry, or fish each day. A 3-oz (85-g) serving of meat is about the same size as a deck of cards. One egg equals 1 oz (28 g). ?  2-3 servings of low-fat dairy each day. One serving is 1 cup (237 mL). ? 1 serving of nuts, seeds, or beans 5 times each week. ? 2-3 servings of heart-healthy fats. Healthy fats called omega-3 fatty acids are found in foods such as walnuts, flaxseeds, fortified milks, and eggs. These fats are also found in cold-water fish, such as sardines, salmon, and mackerel.  Limit how much you eat of: ? Canned or prepackaged foods. ? Food that is high in trans fat, such as some fried  foods. ? Food that is high in saturated fat, such as fatty meat. ? Desserts and other sweets, sugary drinks, and other foods with added sugar. ? Full-fat dairy products.  Do not salt foods before eating.  Do not eat more than 4 egg yolks a week.  Try to eat at least 2 vegetarian meals a week.  Eat more home-cooked food and less restaurant, buffet, and fast food.   Lifestyle  When eating at a restaurant, ask that your food be prepared with less salt or no salt, if possible.  If you drink alcohol: ? Limit how much you use to:  0-1 drink a day for women who are not pregnant.  0-2 drinks a day for men. ? Be aware of how much alcohol is in your drink. In the [place] one drink equals one 12 oz bottle of beer (355 mL), one 5 oz glass of wine (148 mL), or one 1 oz glass of hard liquor (44 mL). General information  Avoid eating more than 2,300 mg of salt a day. If you have hypertension, you may need to reduce your sodium intake to 1,500 mg a day.  Work with your health care provider to maintain a healthy body weight or to lose weight. Ask what an ideal weight is for you.  Get at least 30 minutes of exercise that causes your heart to beat faster (aerobic exercise) most days of the week. Activities may include walking, swimming, or biking.  Work with your health care provider or dietitian to adjust your eating plan to your individual calorie needs. What foods should I eat? Fruits All fresh, dried, or frozen fruit. Canned fruit in natural juice (without added sugar). Vegetables Fresh or frozen vegetables (raw, steamed, roasted, or grilled). Low-sodium or reduced-sodium tomato and vegetable juice. Low-sodium or reduced-sodium tomato sauce and tomato paste. Low-sodium or reduced-sodium canned vegetables. Grains Whole-grain or whole-wheat bread. Whole-grain or whole-wheat pasta. Brown rice. [place]. Bulgur. Whole-grain and low-sodium cereals. Pita bread. Low-fat, low-sodium crackers.  Whole-wheat flour tortillas. Meats and other proteins Skinless chicken or [place]. Ground chicken or [place]. Pork with fat trimmed off. Fish and seafood. Egg whites. Dried beans, peas, or lentils. Unsalted nuts, nut butters, and seeds. Unsalted canned beans. Lean cuts of beef with fat trimmed off. Low-sodium, lean precooked or cured meat, such as sausages or meat loaves. Dairy Low-fat (1%) or fat-free (skim) milk. Reduced-fat, low-fat, or fat-free cheeses. Nonfat, low-sodium ricotta or cottage cheese. Low-fat or nonfat yogurt. Low-fat, low-sodium cheese. Fats and oils Soft margarine without trans fats. Vegetable oil. Reduced-fat, low-fat, or light mayonnaise and salad dressings (reduced-sodium). Canola, safflower, olive, avocado, soybean, and sunflower oils. Avocado. Seasonings and condiments Herbs. Spices. Seasoning mixes without salt. Other foods Unsalted popcorn and pretzels. Fat-free sweets. The items listed above may not be a complete list of foods and beverages you can eat. Contact a dietitian for more information. What foods should I avoid? Fruits Canned fruit in a light or heavy syrup. Maceo Pro  fruit. Fruit in cream or butter sauce. Vegetables Creamed or fried vegetables. Vegetables in a cheese sauce. Regular canned vegetables (not low-sodium or reduced-sodium). Regular canned tomato sauce and paste (not low-sodium or reduced-sodium). Regular tomato and vegetable juice (not low-sodium or reduced-sodium). Angie Fava. Olives. Grains Baked goods made with fat, such as croissants, muffins, or some breads. Dry pasta or rice meal packs. Meats and other proteins Fatty cuts of meat. Ribs. Fried meat. Berniece Salines. Bologna, salami, and other precooked or cured meats, such as sausages or meat loaves. Fat from the back of a pig (fatback). Bratwurst. Salted nuts and seeds. Canned beans with added salt. Canned or smoked fish. Whole eggs or egg yolks. Chicken or [place] with skin. Dairy Whole or 2% milk, cream, and  half-and-half. Whole or full-fat cream cheese. Whole-fat or sweetened yogurt. Full-fat cheese. Nondairy creamers. Whipped toppings. Processed cheese and cheese spreads. Fats and oils Butter. Stick margarine. Lard. Shortening. Ghee. Bacon fat. Tropical oils, such as coconut, palm kernel, or palm oil. Seasonings and condiments Onion salt, garlic salt, seasoned salt, table salt, and sea salt. Worcestershire sauce. Tartar sauce. Barbecue sauce. Teriyaki sauce. Soy sauce, including reduced-sodium. Steak sauce. Canned and packaged gravies. Fish sauce. Oyster sauce. Cocktail sauce. Store-bought horseradish. Ketchup. Mustard. Meat flavorings and tenderizers. Bouillon cubes. Hot sauces. Pre-made or packaged marinades. Pre-made or packaged taco seasonings. Relishes. Regular salad dressings. Other foods Salted popcorn and pretzels. The items listed above may not be a complete list of foods and beverages you should avoid. Contact a dietitian for more information. Where to find more information  National Heart, Lung, and Blood Institute: https://wilson-eaton.com/  American Heart Association: www.heart.org  Academy of Nutrition and Dietetics: www.eatright.[place]: www.kidney.org Summary  The DASH eating plan is a healthy eating plan that has been shown to reduce high blood pressure (hypertension). It may also reduce your risk for type 2 diabetes, heart disease, and stroke.  When on the DASH eating plan, aim to eat more fresh fruits and vegetables, whole grains, lean proteins, low-fat dairy, and heart-healthy fats.  With the DASH eating plan, you should limit salt (sodium) intake to 2,300 mg a day. If you have hypertension, you may need to reduce your sodium intake to 1,500 mg a day.  Work with your health care provider or dietitian to adjust your eating plan to your individual calorie needs. This information is not intended to replace advice given to you by your health care provider. Make  sure you discuss any questions you have with your health care provider. Document Revised: 06/24/2019 Document Reviewed: 06/24/2019 Elsevier Patient Education  2021 [employer].

## 2020-09-17 DIAGNOSIS — M5459 Other low back pain: Secondary | ICD-10-CM | POA: Diagnosis not present

## 2020-09-26 ENCOUNTER — Telehealth: Payer: Self-pay

## 2020-09-26 NOTE — Telephone Encounter (Signed)
Sent patient a MyChart message requesting recent BP values as recommended in the most recent AVS.

## 2020-10-02 ENCOUNTER — Ambulatory Visit: Payer: Self-pay | Admitting: Adult Health

## 2020-10-04 DIAGNOSIS — M545 Low back pain, unspecified: Secondary | ICD-10-CM | POA: Diagnosis not present

## 2020-10-04 DIAGNOSIS — M5459 Other low back pain: Secondary | ICD-10-CM | POA: Diagnosis not present

## 2020-10-08 ENCOUNTER — Encounter: Payer: Self-pay | Admitting: Adult Health

## 2020-10-08 DIAGNOSIS — L578 Other skin changes due to chronic exposure to nonionizing radiation: Secondary | ICD-10-CM | POA: Diagnosis not present

## 2020-10-08 DIAGNOSIS — Z872 Personal history of diseases of the skin and subcutaneous tissue: Secondary | ICD-10-CM | POA: Diagnosis not present

## 2020-10-08 DIAGNOSIS — L570 Actinic keratosis: Secondary | ICD-10-CM | POA: Diagnosis not present

## 2020-10-09 ENCOUNTER — Other Ambulatory Visit: Payer: Self-pay | Admitting: Adult Health

## 2020-10-09 DIAGNOSIS — G4700 Insomnia, unspecified: Secondary | ICD-10-CM

## 2020-10-09 MED ORDER — ALPRAZOLAM 0.5 MG PO TABS: 0.5000 mg | ORAL_TABLET | Freq: Every evening | ORAL | 0 refills | Status: DC | PRN

## 2020-10-09 NOTE — Progress Notes (Signed)
Refill requested.   Meds ordered this encounter  Medications  . ALPRAZolam (XANAX) 0.5 MG tablet    Sig: Take 1-2 tablets (0.5-1 mg total) by mouth at bedtime as needed for anxiety.    Dispense:  60 tablet    Refill:  0

## 2020-10-12 DIAGNOSIS — M5459 Other low back pain: Secondary | ICD-10-CM | POA: Diagnosis not present

## 2020-10-15 ENCOUNTER — Encounter: Payer: Self-pay | Admitting: Adult Health

## 2020-10-15 ENCOUNTER — Ambulatory Visit (INDEPENDENT_AMBULATORY_CARE_PROVIDER_SITE_OTHER): Payer: Medicare Other | Admitting: Adult Health

## 2020-10-15 ENCOUNTER — Other Ambulatory Visit: Payer: Self-pay

## 2020-10-15 VITALS — BP 159/107 | HR 76 | Resp 16 | Wt 215.8 lb

## 2020-10-15 DIAGNOSIS — R9389 Abnormal findings on diagnostic imaging of other specified body structures: Secondary | ICD-10-CM

## 2020-10-15 DIAGNOSIS — Z79899 Other long term (current) drug therapy: Secondary | ICD-10-CM | POA: Diagnosis not present

## 2020-10-15 DIAGNOSIS — Z77011 Contact with and (suspected) exposure to lead: Secondary | ICD-10-CM | POA: Diagnosis not present

## 2020-10-15 DIAGNOSIS — I10 Essential (primary) hypertension: Secondary | ICD-10-CM | POA: Diagnosis not present

## 2020-10-15 DIAGNOSIS — N429 Disorder of prostate, unspecified: Secondary | ICD-10-CM | POA: Diagnosis not present

## 2020-10-15 DIAGNOSIS — E230 Hypopituitarism: Secondary | ICD-10-CM

## 2020-10-15 DIAGNOSIS — E7800 Pure hypercholesterolemia, unspecified: Secondary | ICD-10-CM | POA: Diagnosis not present

## 2020-10-15 DIAGNOSIS — M5440 Lumbago with sciatica, unspecified side: Secondary | ICD-10-CM

## 2020-10-15 DIAGNOSIS — E349 Endocrine disorder, unspecified: Secondary | ICD-10-CM

## 2020-10-15 DIAGNOSIS — R7989 Other specified abnormal findings of blood chemistry: Secondary | ICD-10-CM

## 2020-10-15 DIAGNOSIS — N529 Male erectile dysfunction, unspecified: Secondary | ICD-10-CM | POA: Diagnosis not present

## 2020-10-15 DIAGNOSIS — E291 Testicular hypofunction: Secondary | ICD-10-CM | POA: Diagnosis not present

## 2020-10-15 NOTE — Progress Notes (Signed)
Established patient visit   Patient: Timothy Powell   DOB: 27-May-1976   45 y.o. Male  MRN: [id number] Visit Date: 10/15/2020  Today's healthcare provider: Marcille Buffy, FNP   Chief Complaint  Patient presents with  . Hypertension   Subjective    HPI  Hypertension, follow-up  BP Readings from Last 3 Encounters:  10/16/20 (!) 158/100  10/15/20 (!) 159/107  09/10/20 (!) 160/120   Wt Readings from Last 3 Encounters:  10/16/20 209 lb (94.8 kg)  10/15/20 215 lb 12.8 oz (97.9 kg)  09/10/20 214 lb (97.1 kg)     He was last seen for hypertension 1 months ago.  BP at that visit was 159/111. Management since that visit includes referral was placed for Cardiology and patient started on Amlodipine 5mg . See previous medications tried below, blood pressure is still not controlled.  Blood pressure at cardiology office was 160/120 and he was not taking amlodipine that was prescribed previously by this provider at that time. He reports he took the amlodipine for 3 weeks and could not tolerate due to brain fog and ankle swelling and stopped it.   He is schedule for steroid injection next months at L5 per MRI left side orthopedics. Dr. Maxie Better [facility], left leg sciatica. His MRI was 3 weeks ago.  He has been having issues with erectile difficulties, he is not able to get an erection he has noticed for a few months. This is new for him. Onset 4- 6 months since sciatica started. He is able to " barely get an erection" He reports that ejaculation has changed to clear but still ejaculates at times   He does have cauda equina issues in past.  Denies any loss of bowel or bladder control.   Discussed that testosterone can be the cause of elevated blood pressure however patient reports that he had elevated blood pressure prior to starting any testosterone.   Sees endocrinology on testosterone for hypogonadism.  Heis seeing cardiology on 10/16/20 tomorrow.   He reports fair  compliance with treatment.Patient discontinued 2 weeks ago He is having side effects. Patient reports that blood pressure remained elevated and states that he had episodes of brain fog He is following a Regular diet. He is not exercising. He does not smoke.  Use of agents associated with hypertension: none.   Outside blood pressures are 140/88-169/110. Symptoms: No chest pain No chest pressure  No palpitations No syncope  No dyspnea No orthopnea  No paroxysmal nocturnal dyspnea Yes lower extremity edema  Cardiology note reviewed and brought into note from visit 09/10/20 for continuity of care and medication management: Timothy Powell is a 45 y.o. male with a hx of hypertension, hypogonadism who presents due to difficult to control blood pressures.  Has been managed by primary care regarding BP issues for about a year now.  Medications tried include losartan, which was stopped due to fatigue, HCTZ was discontinued due to leg cramping.  Has a family history of CAD with his father having stents in his 55s.  He was started on Norvasc 5 mg daily by primary care provider.  He has not started taking this medication yet.  1. Patient with history of hypertension, BP elevated.  Start Norvasc 5 mg daily.  Patient advised on low-salt diet, check BP and keep a log daily at home. Elevated LDL, 10-year ASCVD risk 4.1%.  Patient not in statin benefit group.  Low-cholesterol diet recommended. Your next appointment:   2.  1 month(s)  Pertinent labs: Lab Results  Component Value Date   CHOL 167 08/23/2019   HDL 30 (L) 08/23/2019   LDLCALC 117 (H) 08/23/2019   TRIG 111 08/23/2019   Lab Results  Component Value Date   NA 137 09/05/2020   K 5.0 09/05/2020   CREATININE 1.44 (H) 09/05/2020   GFRNONAA 58 (L) 09/05/2020   GFRAA 67 09/05/2020   GLUCOSE 91 09/05/2020     The 10-year ASCVD risk score Timothy Powell., et al., 2013) is: 4.8%    ---------------------------------------------------------------------------------------------------  Patient Active Problem List   Diagnosis Date Noted  . Pure hypercholesterolemia 10/15/2020  . Family history of abdominal aortic aneurysm 09/05/2020  . Left hip pain 09/05/2020  . Actinic keratosis 10/13/2019  . Long-term current use of testosterone replacement therapy 10/12/2019  . Elevated blood lead level 10/12/2019  . Uncontrolled hypertension 09/14/2019  . Insomnia 09/14/2019  . High risk medication use 09/14/2019  . Spinal surgery in prior 3 months- 06/24/19 cervical  09/14/2019  . Elevated testosterone level in male 09/14/2019  . Arthrodesis status 09/13/2019  . Other spondylosis with radiculopathy, cervical region 05/24/2019  . Glenoid labral tear, left, initial encounter 06/15/2018  . Sacral pain 12/02/2017  . Neuropathic pain 12/02/2017  . Body mass index (BMI) 28.0-28.9, adult 10/21/2017  . Elevated blood pressure reading without diagnosis of hypertension 10/21/2017  . Foot drop, left foot 10/21/2017  . Muscle atrophy of lower extremity 10/21/2017  . Pain in thoracic spine 10/21/2017  . History of fusion of cervical spine 04/14/2017  . HNP (herniated nucleus pulposus), cervical 03/23/2017  . Weakness of right upper extremity 01/06/2017  . Cervical radiculopathy 01/06/2017  . Nonallopathic lesion of lumbosacral region 11/18/2016  . Piriformis syndrome of right side 11/03/2016  . Degenerative cervical disc 10/16/2016  . Nonallopathic lesion of cervical region 10/16/2016  . Nonallopathic lesion of thoracic region 10/16/2016  . Nonallopathic lesion of sacral region 10/16/2016  . Chronic autoimmune thyroiditis 09/11/2016  . Hypothyroidism 09/11/2016  . Lateral epicondylitis of right elbow 10/23/2015  . 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 Elevated blood pressure 08/25/2011  . Generalized anxiety disorder 04/19/2009  . Renal function test abnormal  11/29/2007   Past Medical History:  Diagnosis Date  . Cauda equina syndrome ([place])   . Hypertension   . Hypogonadism in male    Allergies  Allergen Reactions  . No Known Allergies        Medications: Outpatient Medications Prior to Visit  Medication Sig  . ALPRAZolam (XANAX) 0.5 MG tablet Take 1-2 tablets (0.5-1 mg total) by mouth at bedtime as needed for anxiety.  . magnesium oxide (MAG-OX) 400 MG tablet Take 400 mg by mouth daily.  . Misc Natural Products (GLUCOSAMINE CHOND COMPLEX/MSM) TABS Take 2 tablets by mouth daily.  . Multiple Vitamin (MULTI-VITAMINS) TABS Take 2 tablets by mouth daily. [place] MEGA MEN  . testosterone cypionate (DEPOTESTOTERONE CYPIONATE) 100 MG/ML injection Inject 100 mg into the muscle every 14 (fourteen) days. For IM use only  . [DISCONTINUED] amLODipine (NORVASC) 5 MG tablet Take 1 tablet (5 mg total) by mouth daily. (Patient not taking: Reported on 10/16/2020)  . [DISCONTINUED] FORTESTA 10 MG/ACT (2%) GEL Place onto the skin. (Patient not taking: No sig reported)   No facility-administered medications prior to visit.    Review of Systems  Constitutional: Negative for activity change, appetite change, chills, diaphoresis, fatigue, fever and unexpected weight change.  HENT: Negative.   Respiratory: Negative.   Cardiovascular: Negative.  Gastrointestinal: Negative for abdominal distention and abdominal pain.  Genitourinary: Negative for decreased urine volume, testicular pain and urgency.       Erectile dysfunction for last 4 months - this is new and he denies having in the past.   Musculoskeletal: Positive for arthralgias, back pain, neck pain and neck stiffness.  Skin: Negative.   Neurological: Positive for numbness (lower extremitiy ).  Psychiatric/Behavioral: Negative for agitation, behavioral problems, confusion, decreased concentration, dysphoric mood, hallucinations, self-injury, sleep disturbance and suicidal ideas. The patient is  nervous/anxious. The patient is not hyperactive.     Last CBC Lab Results  Component Value Date   WBC 6.6 09/05/2020   HGB 17.3 09/05/2020   HCT 51.1 (H) 09/05/2020   MCV 90 09/05/2020   MCH 30.5 09/05/2020   RDW 11.6 09/05/2020   PLT 248 11/57/2620   Last metabolic panel Lab Results  Component Value Date   GLUCOSE 91 09/05/2020   NA 137 09/05/2020   K 5.0 09/05/2020   CL 100 09/05/2020   CO2 26 09/05/2020   BUN 22 09/05/2020   CREATININE 1.44 (H) 09/05/2020   GFRNONAA 58 (L) 09/05/2020   GFRAA 67 09/05/2020   CALCIUM 9.4 09/05/2020   PROT 6.9 09/05/2020   ALBUMIN 4.5 09/05/2020   LABGLOB 2.4 09/05/2020   AGRATIO 1.9 09/05/2020   BILITOT 0.6 09/05/2020   ALKPHOS 63 09/05/2020   AST 36 09/05/2020   ALT 37 09/05/2020   ANIONGAP 8 06/13/2019   Last lipids Lab Results  Component Value Date   CHOL 167 08/23/2019   HDL 30 (L) 08/23/2019   LDLCALC 117 (H) 08/23/2019   TRIG 111 08/23/2019   Last hemoglobin A1c No results found for: HGBA1C Last thyroid functions Lab Results  Component Value Date   TSH 13.500 (H) 08/23/2019   T4TOTAL 5.3 08/23/2019   Last vitamin D Lab Results  Component Value Date   VD25OH 56.9 08/23/2019   Last vitamin B12 and Folate No results found for: VITAMINB12, FOLATE     Objective    BP (!) 159/107   Pulse 76   Resp 16   Wt 215 lb 12.8 oz (97.9 kg)   SpO2 97%   BMI 31.87 kg/m  BP Readings from Last 3 Encounters:  10/16/20 (!) 158/100  10/15/20 (!) 159/107  09/10/20 (!) 160/120   Wt Readings from Last 3 Encounters:  10/16/20 209 lb (94.8 kg)  10/15/20 215 lb 12.8 oz (97.9 kg)  09/10/20 214 lb (97.1 kg)       Physical Exam Vitals reviewed.  Constitutional:      Appearance: Normal appearance.  HENT:     Head: Normocephalic and atraumatic.     Right Ear: External ear normal. There is no impacted cerumen.     Left Ear: External ear normal. There is no impacted cerumen.     Nose: Nose normal.     Mouth/Throat:      Mouth: Mucous membranes are moist.  Eyes:     Extraocular Movements: Extraocular movements intact.     Conjunctiva/sclera: Conjunctivae normal.     Pupils: Pupils are equal, round, and reactive to light.  Cardiovascular:     Rate and Rhythm: Normal rate and regular rhythm.  Abdominal:     General: There is no distension.     Palpations: Abdomen is soft.     Tenderness: There is no abdominal tenderness.  Musculoskeletal:        General: No tenderness or signs of injury. Normal range of  motion.     Cervical back: Normal range of motion and neck supple.  Skin:    General: Skin is warm.  Neurological:     General: No focal deficit present.     Mental Status: He is alert and oriented to person, place, and time.  Psychiatric:        Mood and Affect: Mood normal.        Behavior: Behavior normal.        Thought Content: Thought content normal.        Judgment: Judgment normal.       Results for orders placed or performed in visit on 10/15/20  PSA  Result Value Ref Range   Prostate Specific Ag, Serum 1.4 0.0 - 4.0 ng/mL  Lead, blood (adult age 78 yrs or greater)  Result Value Ref Range   Lead-Whole Blood 17 (H) 0 - 4 ug/dL    Assessment & Plan    1. Uncontrolled hypertension - Ambulatory referral to Neurosurgery  2. Pure hypercholesterolemia  3. Elevated testosterone level in male  4. Erectile dysfunction, unspecified erectile dysfunction type - PSA - Ambulatory referral to Neurosurgery - Ambulatory referral to Urology  5. Back pain of lumbar region with sciatica - Ambulatory referral to Neurosurgery  6. Abnormal MRI - Ambulatory referral to Neurosurgery  7. Testosterone deficiency - PSA  8. Lead exposure risk assessment, high risk - Lead, blood (adult age 47 yrs or greater)  9. High risk medication use - PSA  10. Hypogonadism male - PSA - Ambulatory referral to Neurosurgery  11. Disorder of prostate, unspecified  - PSA  12. 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  Cholesterol, work on with diet and exercise. May need statin.   Patient is also preferring to wait and have cardiology at tried other blood pressure medicines he said he sees them tomorrow.  Discussed the dangers of uncontrolled hypertension.  He does have a family history of cardiac disease.  He has been sent to cardiology for evaluation and further work-up.  Also discussed the etiology of erectile dysfunction and that it could be related to, testosterone level, vascular cardiovascular disease or could be of urological etiology.  Patient verbalizes understanding of risk versus benefits of untreated hypertension.  Patient also would like to have his lead levels rechecked, lead levels were elevated previously and advised to contact the health department for further work-up.  Patient would like to have that rechecked today.  He was not seen at the health department. Return in about 1 month (around 11/15/2020), or if symptoms worsen or fail to improve, for at any time for any worsening symptoms, Go to Emergency room/ urgent care if worse.      The entirety of the information documented in the History of Present Illness, Review of Systems and Physical Exam were personally obtained by me. Portions of this information were initially documented by the CMA and reviewed by me for thoroughness and accuracy.    Red Flags discussed. The patient was given clear instructions to go to ER or return to medical center if any red flags develop, symptoms do not improve, worsen or new problems develop. They verbalized understanding.    Timothy Powell, [place] [phone] (phone) [phone] (fax)  [place]

## 2020-10-15 NOTE — Patient Instructions (Addendum)
Erectile Dysfunction Erectile dysfunction (ED) is the inability to get or keep an erection in order to have sexual intercourse. ED is considered a symptom of an underlying disorder and not considered a disease. Erectile dysfunction may include:  Inability to get an erection.  Lack of enough hardness of the erection to allow penetration.  Loss of the erection before sex is finished. What are the causes? This condition may be caused by:  Certain medicines, such as: ? Pain relievers. ? Antihistamines. ? Antidepressants. ? Blood pressure medicines. ? Water pills (diuretics). ? Ulcer medicines. ? Muscle relaxants. ? Drugs.  Excessive drinking.  Psychological causes, such as: ? Anxiety. ? Depression. ? Sadness. ? Exhaustion. ? Performance fear. ? Stress.  Physical causes, such as: ? Artery problems. This may include diabetes, smoking, liver disease, or atherosclerosis. ? High blood pressure. ? Hormonal problems, such as low testosterone. ? Obesity. ? Nerve problems. This may include back or pelvic injuries, diabetes mellitus, multiple sclerosis, or Parkinson's disease. What are the signs or symptoms? Symptoms of this condition include:  Inability to get an erection.  Lack of enough hardness of the erection to allow penetration.  Loss of the erection before sex is finished.  Normal erections at some times, but with frequent unsatisfactory episodes.  Low sexual satisfaction in either partner due to erection problems.  A curved penis occurring with erection. The curve may cause pain or the penis may be too curved to allow for intercourse.  Never having nighttime erections. How is this diagnosed? This condition is often diagnosed by:  Performing a physical exam to find other diseases or specific problems with the penis.  Asking you detailed questions about the problem.  Performing blood tests to check for diabetes mellitus or to measure hormone  levels.  Performing other tests to check for underlying health conditions.  Performing an ultrasound exam to check for scarring.  Performing a test to check blood flow to the penis.  Doing a sleep study at home to measure nighttime erections. How is this treated? This condition may be treated by:  Medicine taken by mouth to help you achieve an erection (oral medicine).  Hormone replacement therapy to replace low testosterone levels.  Medicine that is injected into the penis. Your health care provider may instruct you how to give yourself these injections at home.  Vacuum pump. This is a pump with a ring on it. The pump and ring are placed on the penis and used to create pressure that helps the penis become erect.  Penile implant surgery. In this procedure, you may receive: ? An inflatable implant. This consists of cylinders, a pump, and a reservoir. The cylinders can be inflated with a fluid that helps to create an erection, and they can be deflated after intercourse. ? A semi-rigid implant. This consists of two silicone rubber rods. The rods provide some rigidity. They are also flexible, so the penis can both curve downward in its normal position and become straight for sexual intercourse.  Blood vessel surgery, to improve blood flow to the penis. During this procedure, a blood vessel from a different part of the body is placed into the penis to allow blood to flow around (bypass) damaged or blocked blood vessels.  Lifestyle changes, such as exercising more, losing weight, and quitting smoking. Follow these instructions at home: Medicines  Take over-the-counter and prescription medicines only as told by your health care provider. Do not increase the dosage without first discussing it with your  health care provider.  If you are using self-injections, perform injections as directed by your health care provider. Make sure to avoid any veins that are on the surface of the penis. After  giving an injection, apply pressure to the injection site for 5 minutes.   General instructions  Exercise regularly, as directed by your health care provider. Work with your health care provider to lose weight, if needed.  Do not use any products that contain nicotine or tobacco, such as cigarettes and e-cigarettes. If you need help quitting, ask your health care provider.  Before using a vacuum pump, read the instructions that come with the pump and discuss any questions with your health care provider.  Keep all follow-up visits as told by your health care provider. This is important. Contact a health care provider if:  You feel nauseous.  You vomit. Get help right away if:  You are taking oral or injectable medicines and you have an erection that lasts longer than 4 hours. If your health care provider is unavailable, go to the nearest emergency room for evaluation. An erection that lasts much longer than 4 hours can result in permanent damage to your penis.  You have severe pain in your groin or abdomen.  You develop redness or severe swelling of your penis.  You have redness spreading up into your groin or lower abdomen.  You are unable to urinate.  You experience chest pain or a rapid heart beat (palpitations) after taking oral medicines. Summary  Erectile dysfunction (ED) is the inability to get or keep an erection during sexual intercourse. This problem can usually be treated successfully.  This condition is diagnosed based on a physical exam, your symptoms, and tests to determine the cause. Treatment varies depending on the cause and may include medicines, hormone therapy, surgery, or a vacuum pump.  You may need follow-up visits to make sure that you are using your medicines or devices correctly.  Get help right away if you are taking or injecting medicines and you have an erection that lasts longer than 4 hours. This information is not intended to replace advice given to  you by your health care provider. Make sure you discuss any questions you have with your health care provider. Document Revised: 04/06/2020 Document Reviewed: 04/06/2020 Elsevier Patient Education  2021 [place]. Preventing Hypertension Hypertension, also called high blood pressure, is when the force of blood pumping through the arteries is too strong. Arteries are blood vessels that carry blood from the heart throughout the body. Often, hypertension does not cause symptoms until blood pressure is very high. It is important to have your blood pressure checked regularly. Diet and lifestyle changes can help you prevent hypertension, and they may make you feel better overall and improve your quality of life. If you already have hypertension, you may control it with diet and lifestyle changes, as well as with medicine. How can this condition affect me? Over time, hypertension can damage the arteries and decrease blood flow to important parts of the body, including the brain, heart, and kidneys. By keeping your blood pressure in a healthy range, you can help prevent complications like heart attack, heart failure, stroke, kidney failure, and vascular dementia. What can increase my risk?  Being an older adult. Older people are more often affected.  Having family members who have had high blood pressure.  Being obese.  Being male. Males are more likely to have high blood pressure.  Drinking too much alcohol or caffeine.  Smoking or using illegal drugs.  Taking certain medicines, such as antidepressants, decongestants, birth control pills, and NSAIDs, such as ibuprofen.  Having thyroid problems.  Having certain tumors. What actions can I take to prevent or manage this condition? Work with your health care provider to make a hypertension prevention plan that works for you. Follow your plan and keep all follow-up visits as told by your health care provider. Diet changes Maintain a healthy  diet. This includes:  Eating less salt (sodium). Ask your health care provider how much sodium is safe for you to have. The general recommendation is to have less than 1 tsp (2,300 mg) of sodium a day. ? Do not add salt to your food. ? Choose low-sodium options when grocery shopping and eating out.  Limiting fats in your diet. You can do this by eating low-fat or fat-free dairy products and by eating less red meat.  Eating more fruits, vegetables, and whole grains. Make a goal to eat: ? 1-2 cups of fresh fruits and vegetables each day. ? 3-4 servings of whole grains each day.  Avoiding foods and beverages that have added sugars.  Eating fish that contain healthy fats (omega-3 fatty acids), such as mackerel or salmon. If you need help putting together a healthy eating plan, try the DASH diet. This diet is high in fruits, vegetables, and whole grains. It is low in sodium, red meat, and added sugars. DASH stands for Dietary Approaches to Stop Hypertension.   Lifestyle changes Lose weight if you are overweight. Losing just 3?5% of your body weight can help prevent or control hypertension. For example, if your present weight is 200 lb (91 kg), a loss of 3-5% of your weight means losing 6-10 lb (2.7-4.5 kg). Ask your health care provider to help you with a diet and exercise plan to safely lose weight. Other recommendations usually include:  Get enough exercise. Do at least 150 minutes of moderate-intensity exercise each week. You could do this in short exercise sessions several times a day, or you could do longer exercise sessions a few times a week. For example, you could take a brisk 10-minute walk or bike ride, 3 times a day, for 5 days a week.  Find ways to reduce stress, such as exercising, meditating, listening to music, or taking a yoga class. If you need help reducing stress, ask your health care provider.  Do not use any products that contain nicotine or tobacco, such as cigarettes,  e-cigarettes, and chewing tobacco. If you need help quitting, ask your health care provider. Chemicals in tobacco and nicotine products raise your blood pressure each time you use them. If you need help quitting, ask your health care provider.  Learn how to check your blood pressure at home. Make sure that you know your personal target blood pressure, as told by your health care provider.  Try to sleep 7-9 hours per night.   Alcohol use  Do not drink alcohol if: ? Your health care provider tells you not to drink. ? You are pregnant, may be pregnant, or are planning to become pregnant.  If you drink alcohol: ? Limit how much you use to:  0-1 drink a day for women.  0-2 drinks a day for men. ? Be aware of how much alcohol is in your drink. In the [place] one drink equals one 12 oz bottle of beer (355 mL), one 5 oz glass of wine (148 mL), or one 1 oz glass of hard liquor (44  mL). Medicines In addition to diet and lifestyle changes, your health care provider may recommend medicines to help lower your blood pressure. In general:  You may need to try a few different medicines to find what works best for you.  You may need to take more than one medicine.  Take over-the-counter and prescription medicines only as told by your health care provider. Questions to ask your health care provider  What is my blood pressure goal?  How can I lower my risk for high blood pressure?  How should I monitor my blood pressure at home? Where to find support Your health care provider can help you prevent hypertension and help you keep your blood pressure at a healthy level. Your local hospital or your community may also provide support services and prevention programs. The American Heart Association offers an online support network at supportnetwork.heart.org Where to find more information Learn more about hypertension from:  [place], Lung, and [place]: https://wilson-eaton.com/  Centers for  Disease Control and Prevention: http://www.wolf.info/  American Academy of Family Physicians: familydoctor.org Learn more about the DASH diet from:  [place], Lung, and [place]: https://wilson-eaton.com/ Contact a health care provider if:  You think you are having a reaction to medicines you have taken.  You have recurrent headaches or feel dizzy.  You have swelling in your ankles.  You have trouble with your vision. Get help right away if:  You have sudden, severe chest, back, or abdominal pain or discomfort.  You have shortness of breath.  You have a sudden, severe headache. These symptoms may represent a serious problem that is an emergency. Do not wait to see if the symptoms will go away. Get medical help right away. Call your local emergency services (911 in the [place] Do not drive yourself to the hospital.  Summary  Hypertension often does not cause any symptoms until blood pressure is very high. It is important to get your blood pressure checked regularly.  Diet and lifestyle changes are important steps in preventing hypertension.  By keeping your blood pressure in a healthy range, you may prevent complications like heart attack, heart failure, stroke, and kidney failure.  Work with your health care provider to make a hypertension prevention plan that works for you. This information is not intended to replace advice given to you by your health care provider. Make sure you discuss any questions you have with your health care provider. Document Revised: 06/21/2019 Document Reviewed: 06/21/2019 Elsevier Patient Education  2021 [place]. Hypertension, Adult Hypertension is another name for high blood pressure. High blood pressure forces your heart to work harder to pump blood. This can cause problems over time. There are two numbers in a blood pressure reading. There is a top number (systolic) over a bottom number (diastolic). It is best to have a blood pressure that is below  120/80. Healthy choices can help lower your blood pressure, or you may need medicine to help lower it. What are the causes? The cause of this condition is not known. Some conditions may be related to high blood pressure. What increases the risk?  Smoking.  Having type 2 diabetes mellitus, high cholesterol, or both.  Not getting enough exercise or physical activity.  Being overweight.  Having too much fat, sugar, calories, or salt (sodium) in your diet.  Drinking too much alcohol.  Having long-term (chronic) kidney disease.  Having a family history of high blood pressure.  Age. Risk increases with age.  Race. You may  be at higher risk if you are African American.  Gender. Men are at higher risk than women before age 17. After age 84, women are at higher risk than men.  Having obstructive sleep apnea.  Stress. What are the signs or symptoms?  High blood pressure may not cause symptoms. Very high blood pressure (hypertensive crisis) may cause: ? Headache. ? Feelings of worry or nervousness (anxiety). ? Shortness of breath. ? Nosebleed. ? A feeling of being sick to your stomach (nausea). ? Throwing up (vomiting). ? Changes in how you see. ? Very bad chest pain. ? Seizures. How is this treated?  This condition is treated by making healthy lifestyle changes, such as: ? Eating healthy foods. ? Exercising more. ? Drinking less alcohol.  Your health care provider may prescribe medicine if lifestyle changes are not enough to get your blood pressure under control, and if: ? Your top number is above 130. ? Your bottom number is above 80.  Your personal target blood pressure may vary. Follow these instructions at home: Eating and drinking  If told, follow the DASH eating plan. To follow this plan: ? Fill one half of your plate at each meal with fruits and vegetables. ? Fill one fourth of your plate at each meal with whole grains. Whole grains include whole-wheat pasta,  brown rice, and whole-grain bread. ? Eat or drink low-fat dairy products, such as skim milk or low-fat yogurt. ? Fill one fourth of your plate at each meal with low-fat (lean) proteins. Low-fat proteins include fish, chicken without skin, eggs, beans, and tofu. ? Avoid fatty meat, cured and processed meat, or chicken with skin. ? Avoid pre-made or processed food.  Eat less than 1,500 mg of salt each day.  Do not drink alcohol if: ? Your doctor tells you not to drink. ? You are pregnant, may be pregnant, or are planning to become pregnant.  If you drink alcohol: ? Limit how much you use to:  0-1 drink a day for women.  0-2 drinks a day for men. ? Be aware of how much alcohol is in your drink. In the [place] one drink equals one 12 oz bottle of beer (355 mL), one 5 oz glass of wine (148 mL), or one 1 oz glass of hard liquor (44 mL).   Lifestyle  Work with your doctor to stay at a healthy weight or to lose weight. Ask your doctor what the best weight is for you.  Get at least 30 minutes of exercise most days of the week. This may include walking, swimming, or biking.  Get at least 30 minutes of exercise that strengthens your muscles (resistance exercise) at least 3 days a week. This may include lifting weights or doing Pilates.  Do not use any products that contain nicotine or tobacco, such as cigarettes, e-cigarettes, and chewing tobacco. If you need help quitting, ask your doctor.  Check your blood pressure at home as told by your doctor.  Keep all follow-up visits as told by your doctor. This is important.   Medicines  Take over-the-counter and prescription medicines only as told by your doctor. Follow directions carefully.  Do not skip doses of blood pressure medicine. The medicine does not work as well if you skip doses. Skipping doses also puts you at risk for problems.  Ask your doctor about side effects or reactions to medicines that you should watch for. Contact a doctor  if you:  Think you are having a reaction to the  medicine you are taking.  Have headaches that keep coming back (recurring).  Feel dizzy.  Have swelling in your ankles.  Have trouble with your vision. Get help right away if you:  Get a very bad headache.  Start to feel mixed up (confused).  Feel weak or numb.  Feel faint.  Have very bad pain in your: ? Chest. ? Belly (abdomen).  Throw up more than once.  Have trouble breathing. Summary  Hypertension is another name for high blood pressure.  High blood pressure forces your heart to work harder to pump blood.  For most people, a normal blood pressure is less than 120/80.  Making healthy choices can help lower blood pressure. If your blood pressure does not get lower with healthy choices, you may need to take medicine. This information is not intended to replace advice given to you by your health care provider. Make sure you discuss any questions you have with your health care provider. Document Revised: 03/31/2018 Document Reviewed: 03/31/2018 Elsevier Patient Education  2021 [employer].

## 2020-10-16 ENCOUNTER — Other Ambulatory Visit: Payer: Self-pay

## 2020-10-16 ENCOUNTER — Encounter: Payer: Self-pay | Admitting: Cardiology

## 2020-10-16 ENCOUNTER — Ambulatory Visit (INDEPENDENT_AMBULATORY_CARE_PROVIDER_SITE_OTHER): Payer: Medicare Other | Admitting: Cardiology

## 2020-10-16 VITALS — BP 158/100 | HR 85 | Ht 69.0 in | Wt 209.0 lb

## 2020-10-16 DIAGNOSIS — E7800 Pure hypercholesterolemia, unspecified: Secondary | ICD-10-CM

## 2020-10-16 DIAGNOSIS — I10 Essential (primary) hypertension: Secondary | ICD-10-CM

## 2020-10-16 MED ORDER — CARVEDILOL 6.25 MG PO TABS: 6.2500 mg | ORAL_TABLET | Freq: Two times a day (BID) | ORAL | 1 refills | Status: DC

## 2020-10-16 NOTE — Progress Notes (Signed)
PSA within normal limits.  Patient was to  discuss symptoms of erectile dysfunction  with  neurosurgery and cardiology if you need referral to urologist we can place. We can place now if he prefers.  Lead level not resulted yet.

## 2020-10-16 NOTE — Patient Instructions (Signed)
Medication Instructions:   Your physician has recommended you make the following change in your medication:   1.  START taking Coreg (Carvedilol) 6.25 MG: take one tab twice a day.  *If you need a refill on your cardiac medications before your next appointment, please call your pharmacy*   Lab Work: None ordered If you have labs (blood work) drawn today and your tests are completely normal, you will receive your results only by: [name] MyChart Message (if you have MyChart) OR . A paper copy in the mail If you have any lab test that is abnormal or we need to change your treatment, we will call you to review the results.   Testing/Procedures: None ordered   Follow-Up: At [facility], you and your health needs are our priority.  As part of our continuing mission to provide you with exceptional heart care, we have created designated Provider Care Teams.  These Care Teams include your primary Cardiologist (physician) and Advanced Practice Providers (APPs -  Physician Assistants and Nurse Practitioners) who all work together to provide you with the care you need, when you need it.  We recommend signing up for the patient portal called "MyChart".  Sign up information is provided on this After Visit Summary.  MyChart is used to connect with patients for Virtual Visits (Telemedicine).  Patients are able to view lab/test results, encounter notes, upcoming appointments, etc.  Non-urgent messages can be sent to your provider as well.   To learn more about what you can do with MyChart, go to [email or web address].    Your next appointment:   6 week(s)  The format for your next appointment:   In Person  Provider:   Kate Sable, MD   Other Instructions

## 2020-10-16 NOTE — Progress Notes (Signed)
Cardiology Office Note:    Date:  10/16/2020   ID:  [name], DOB 1976/01/12, MRN [id number]  PCP:  Doreen Beam, FNP  [facility] Cardiologist:  Kate Sable, MD  [place] Electrophysiologist:  None   Referring MD: Sharmon Leyden*   Chief Complaint  Patient presents with  . Follow-up    1 month  Pt states he had to stop taking amlodipine, states he could not focus, had brain fog and was forgetting things, ankle swelling and states it did not help his BP.    History of Present Illness:    Timothy Powell is a 45 y.o. male with a hx of hypertension, hypogonadism who presents for follow-up.  Previously seen due to difficult to control blood pressures.    Amlodipine 5 mg was started, but patient stopped taking this due to patient not being able to focus, brain fog, ankle swelling.  He previously could not tolerate losartan due to fatigue, HCTZ due to leg cramping.   Past Medical History:  Diagnosis Date  . Cauda equina syndrome ([place])   . Hypertension   . Hypogonadism in male     Past Surgical History:  Procedure Laterality Date  . ankle muscle and skin graft Right 1997  . ANTERIOR CERVICAL DECOMP/DISCECTOMY FUSION N/A 03/23/2017   Procedure: Anterior Cervical Decompresion/Discectomy Fusion - Cervical Four-Cervical five - Cervical five-Cervical six - Cervical six-Cervical seven;  Surgeon: [name], MD;  Location: [facility];  Service: Neurosurgery;  Laterality: N/A;  . ANTERIOR CERVICAL DECOMP/DISCECTOMY FUSION N/A 06/15/2019   Procedure: Cervical seven Thoracic one Anterior cervical decompression/discectomy/fusion with removal of atlantis cervical plate;  Surgeon: [name], MD;  Location: [facility];  Service: Neurosurgery;  Laterality: N/A;  . BACK SURGERY  1997   fusion l4/5,  . ELBOW SURGERY Right 2017   repair tear  . 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 removal of skin and muscle graft Right 1997   infection  . repair bicep tear  Right 2015  . SHOULDER SURGERY  2011  . skin grafts Right    right ankle and arm    Current Medications: Current Meds  Medication Sig  . ALPRAZolam (XANAX) 0.5 MG tablet Take 1-2 tablets (0.5-1 mg total) by mouth at bedtime as needed for anxiety.  . carvedilol (COREG) 6.25 MG tablet Take 1 tablet (6.25 mg total) by mouth 2 (two) times daily.  . magnesium oxide (MAG-OX) 400 MG tablet Take 400 mg by mouth daily.  . Misc Natural Products (GLUCOSAMINE CHOND COMPLEX/MSM) TABS Take 2 tablets by mouth daily.  . Multiple Vitamin (MULTI-VITAMINS) TABS Take 2 tablets by mouth daily. [employer] MEGA MEN  . testosterone cypionate (DEPOTESTOTERONE CYPIONATE) 100 MG/ML injection Inject 100 mg into the muscle every 14 (fourteen) days. For IM use only     Allergies:   Patient has no known allergies.   Social History   Socioeconomic History  . Marital status: Single    Spouse name: Not on file  . Number of children: 1  . Years of education: Not on file  . Highest education level: Bachelor's degree (e.g., BA, AB, BS)  Occupational History  . Occupation: retired  Tobacco Use  . Smoking status: Never Smoker  . Smokeless tobacco: Never Used  Vaping Use  . Vaping Use: Never used  Substance and Sexual Activity  . Alcohol use: Yes    Alcohol/week: 0.0 standard drinks    Comment: social/monthly  . Drug use:  No  . Sexual activity: Not on file  Other Topics Concern  . Not on file  Social History Narrative  . Not on file   Social Determinants of Health   Financial Resource Strain: Low Risk   . Difficulty of Paying Living Expenses: Not hard at all  Food Insecurity: No Food Insecurity  . Worried About Charity fundraiser in the Last Year: Never true  . Ran Out of Food in the Last Year: Never true  Transportation Needs: No Transportation Needs  . Lack of Transportation (Medical): No  . Lack of Transportation (Non-Medical): No  Physical Activity: Sufficiently Active  . Days of Exercise per Week: 5  days  . Minutes of Exercise per Session: 120 min  Stress: No Stress Concern Present  . Feeling of Stress : Not at all  Social Connections: Socially Isolated  . Frequency of Communication with Friends and Family: More than three times a week  . Frequency of Social Gatherings with Friends and Family: More than three times a week  . Attends Religious Services: Never  . Active Member of Clubs or Organizations: No  . Attends Archivist Meetings: Never  . Marital Status: Never married     Family History: The patient's family history includes Anuerysm in his father; Diabetes in his paternal grandmother; Heart disease in his maternal grandfather; Melanoma in his mother; Pancreatic cancer in his paternal grandmother. There is no history of Thyroid disease.  ROS:   Please see the history of present illness.     All other systems reviewed and are negative.  EKGs/Labs/Other Studies Reviewed:    The following studies were reviewed today:   EKG:  EKG not ordered today.   Recent Labs: 09/05/2020: ALT 37; BUN 22; Creatinine, Ser 1.44; Hemoglobin 17.3; Platelets 248; Potassium 5.0; Sodium 137  Recent Lipid Panel    Component Value Date/Time   CHOL 167 08/23/2019 0904   TRIG 111 08/23/2019 0904   HDL 30 (L) 08/23/2019 0904   LDLCALC 117 (H) 08/23/2019 0904     Risk Assessment/Calculations:      Physical Exam:    VS:  BP (!) 158/100   Pulse 85   Ht 5\' 9"  (1.753 m)   Wt 209 lb (94.8 kg)   BMI 30.86 kg/m     Wt Readings from Last 3 Encounters:  10/16/20 209 lb (94.8 kg)  10/15/20 215 lb 12.8 oz (97.9 kg)  09/10/20 214 lb (97.1 kg)     GEN:  Well nourished, well developed in no acute distress HEENT: Normal NECK: No JVD; No carotid bruits LYMPHATICS: No lymphadenopathy CARDIAC: RRR, no murmurs, rubs, gallops RESPIRATORY:  Clear to auscultation without rales, wheezing or rhonchi  ABDOMEN: Soft, non-tender, non-distended MUSCULOSKELETAL:  No edema; lower extremity  muscular atrophy noted below the knees SKIN: Warm and dry NEUROLOGIC:  Alert and oriented x 3 PSYCHIATRIC:  Normal affect   ASSESSMENT:    1. Primary hypertension   2. Pure hypercholesterolemia    PLAN:    In order of problems listed above:  1. Hypertension, BP still elevated.  Stop Norvasc, start Coreg 6.25 mg twice daily.  Testosterone likely contributing to elevated blood pressures.  Low-salt diet advised.  If patient does not tolerate Coreg, will consider hydralazine next, and then clonidine. 2. Elevated LDL, low 10-year ASCVD risk.  Not in statin benefit group.  Low-cholesterol diet advised.  Follow-up in 6 weeks      Medication Adjustments/Labs and Tests Ordered: Current medicines are  reviewed at length with the patient today.  Concerns regarding medicines are outlined above.  No orders of the defined types were placed in this encounter.  Meds ordered this encounter  Medications  . carvedilol (COREG) 6.25 MG tablet    Sig: Take 1 tablet (6.25 mg total) by mouth 2 (two) times daily.    Dispense:  60 tablet    Refill:  1    Patient Instructions  Medication Instructions:   Your physician has recommended you make the following change in your medication:   1.  START taking Coreg (Carvedilol) 6.25 MG: take one tab twice a day.  *If you need a refill on your cardiac medications before your next appointment, please call your pharmacy*   Lab Work: None ordered If you have labs (blood work) drawn today and your tests are completely normal, you will receive your results only by: [name] MyChart Message (if you have MyChart) OR . A paper copy in the mail If you have any lab test that is abnormal or we need to change your treatment, we will call you to review the results.   Testing/Procedures: None ordered   Follow-Up: At [facility], you and your health needs are our priority.  As part of our continuing mission to provide you with exceptional heart care, we have  created designated Provider Care Teams.  These Care Teams include your primary Cardiologist (physician) and Advanced Practice Providers (APPs -  Physician Assistants and Nurse Practitioners) who all work together to provide you with the care you need, when you need it.  We recommend signing up for the patient portal called "MyChart".  Sign up information is provided on this After Visit Summary.  MyChart is used to connect with patients for Virtual Visits (Telemedicine).  Patients are able to view lab/test results, encounter notes, upcoming appointments, etc.  Non-urgent messages can be sent to your provider as well.   To learn more about what you can do with MyChart, go to [email or web address].    Your next appointment:   6 week(s)  The format for your next appointment:   In Person  Provider:   Kate Sable, MD   Other Instructions      Signed, Kate Sable, MD  10/16/2020 10:44 AM    [place]

## 2020-10-17 DIAGNOSIS — M5440 Lumbago with sciatica, unspecified side: Secondary | ICD-10-CM | POA: Insufficient documentation

## 2020-10-17 DIAGNOSIS — R9389 Abnormal findings on diagnostic imaging of other specified body structures: Secondary | ICD-10-CM | POA: Insufficient documentation

## 2020-10-17 DIAGNOSIS — N529 Male erectile dysfunction, unspecified: Secondary | ICD-10-CM | POA: Insufficient documentation

## 2020-10-17 DIAGNOSIS — E291 Testicular hypofunction: Secondary | ICD-10-CM | POA: Insufficient documentation

## 2020-10-17 DIAGNOSIS — N429 Disorder of prostate, unspecified: Secondary | ICD-10-CM | POA: Insufficient documentation

## 2020-10-17 LAB — PSA: Prostate Specific Ag, Serum: 1.4 ng/mL (ref 0.0–4.0)

## 2020-10-17 LAB — LEAD, BLOOD (ADULT >= 16 YRS): Lead-Whole Blood: 17 ug/dL — ABNORMAL HIGH (ref 0–4)

## 2020-10-17 NOTE — Progress Notes (Signed)
Lead levels 17, needs to contact his local health department for evaluation.

## 2020-10-22 ENCOUNTER — Telehealth: Payer: Self-pay

## 2020-10-22 NOTE — Telephone Encounter (Signed)
Copied from [place] [id number]. Topic: General - Other >> Oct 22, 2020  1:03 PM Alanda Slim E wrote: Reason for CRM: [facility] neurosurgery received the referral but they need the MRI report / fax# [phone]/ please advise

## 2020-10-24 ENCOUNTER — Encounter: Payer: Self-pay | Admitting: Urology

## 2020-10-24 ENCOUNTER — Other Ambulatory Visit: Payer: Self-pay

## 2020-10-24 ENCOUNTER — Ambulatory Visit (INDEPENDENT_AMBULATORY_CARE_PROVIDER_SITE_OTHER): Payer: Medicare Other | Admitting: Urology

## 2020-10-24 VITALS — BP 159/104 | HR 92 | Ht 70.0 in | Wt 215.0 lb

## 2020-10-24 DIAGNOSIS — N5203 Combined arterial insufficiency and corporo-venous occlusive erectile dysfunction: Secondary | ICD-10-CM

## 2020-10-24 DIAGNOSIS — E291 Testicular hypofunction: Secondary | ICD-10-CM | POA: Diagnosis not present

## 2020-10-24 MED ORDER — SILDENAFIL CITRATE 20 MG PO TABS: 20.0000 mg | ORAL_TABLET | ORAL | 11 refills | Status: DC | PRN

## 2020-10-24 NOTE — Progress Notes (Signed)
10/24/2020 3:49 PM   XAI FRERKING Nov 05, 1975 [id number]  Referring provider: Doreen Beam, [address] [place] [address] [place],  [place] [address]  Chief Complaint  Patient presents with  . Erectile Dysfunction    HPI: 45 year old male with a personal history of hypogonadism who presents today for further evaluation of erectile dysfunction.  He reports that over the past several months, he has been increasingly having difficulty both maintaining and achieving erections.  He is never had this in the past.  He denies any ejaculatory dysfunction.  He does have a personal history of hypogonadism which was diagnosed almost 20 years ago after an accident.  He has been on testosterone replacement since that time.  He is currently managed by an endocrinologist.  His testosterone levels are within normal limits on AndroGel 7 pumps daily.  Labs are reviewed today.  He does follow with a cardiologist.  He has longstanding hypertension but no other risk factors.    SHIM    Row Name 10/24/20 1542         SHIM: Over the last 6 months:   How do you rate your confidence that you could get and keep an erection? Low     When you had erections with sexual stimulation, how often were your erections hard enough for penetration (entering your partner)? Almost Always or Always     During sexual intercourse, how often were you able to maintain your erection after you had penetrated (entered) your partner? A Few Times (much less than half the time)     During sexual intercourse, how difficult was it to maintain your erection to completion of intercourse? Very Difficult     When you attempted sexual intercourse, how often was it satisfactory for you? A Few Times (much less than half the time)           SHIM Total Score   SHIM 13             PMH: Past Medical History:  Diagnosis Date  . Cauda equina syndrome ([place])   . Hypertension   . Hypogonadism in male     Surgical  History: Past Surgical History:  Procedure Laterality Date  . ankle muscle and skin graft Right 1997  . ANTERIOR CERVICAL DECOMP/DISCECTOMY FUSION N/A 03/23/2017   Procedure: Anterior Cervical Decompresion/Discectomy Fusion - Cervical Four-Cervical five - Cervical five-Cervical six - Cervical six-Cervical seven;  Surgeon: [name], MD;  Location: [facility];  Service: Neurosurgery;  Laterality: N/A;  . ANTERIOR CERVICAL DECOMP/DISCECTOMY FUSION N/A 06/15/2019   Procedure: Cervical seven Thoracic one Anterior cervical decompression/discectomy/fusion with removal of atlantis cervical plate;  Surgeon: [name], MD;  Location: [facility];  Service: Neurosurgery;  Laterality: N/A;  . BACK SURGERY  1997   fusion l4/5,  . ELBOW SURGERY Right 2017   repair tear  . 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 removal of skin and muscle graft Right 1997   infection  . repair bicep tear Right 2015  . SHOULDER SURGERY  2011  . skin grafts Right    right ankle and arm    Home Medications:  Allergies as of 10/24/2020      Reactions   No Known Allergies       Medication List       Accurate as of October 24, 2020  3:49 PM. If you have any questions, ask your nurse or doctor.        ALPRAZolam 0.5  MG tablet Commonly known as: XANAX Take 1-2 tablets (0.5-1 mg total) by mouth at bedtime as needed for anxiety.   carvedilol 6.25 MG tablet Commonly known as: COREG Take 1 tablet (6.25 mg total) by mouth 2 (two) times daily.   Glucosamine Chond Complex/MSM Tabs Take 2 tablets by mouth daily.   magnesium oxide 400 MG tablet Commonly known as: MAG-OX Take 400 mg by mouth daily.   Multi-Vitamins Tabs Take 2 tablets by mouth daily. [employer] MEGA MEN   testosterone cypionate 100 MG/ML injection Commonly known as: DEPOTESTOTERONE CYPIONATE Inject 100 mg into the muscle every 14 (fourteen) days. For IM use only       Allergies:  Allergies  Allergen Reactions  . No Known Allergies     Family  History: Family History  Problem Relation Age of Onset  . Heart disease Maternal Grandfather   . Diabetes Paternal Grandmother   . Pancreatic cancer Paternal Grandmother   . Melanoma Mother   . Anuerysm Father   . Thyroid disease Neg Hx     Social History:  reports that he has never smoked. He has never used smokeless tobacco. He reports current alcohol use. He reports that he does not use drugs.   Physical Exam: BP (!) 159/104   Pulse 92   Ht 5\' 10"  (1.778 m)   Wt 215 lb (97.5 kg)   BMI 30.85 kg/m   Constitutional:  Alert and oriented, No acute distress. HEENT: [place] AT, moist mucus membranes.  Trachea midline, no masses. Cardiovascular: No clubbing, cyanosis, or edema. Respiratory: Normal respiratory effort, no increased work of breathing. Skin: No rashes, bruises or suspicious lesions. Neurologic: Grossly intact, no focal deficits, moving all 4 extremities. Psychiatric: Normal mood and affect.  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  He does have longstanding hypertension and follows with a cardiologist.  I recommended that he have a discussion with his cardiologist whether a stress test would be warranted especially given his risk factors as well as erectile dysfunction which can be an early sign of cardiovascular disease.  He also has a strong family history of heart diease.  We discussed possible treatment options including PDE 5 inhibitors, vacuum erectile device, intracavernosal  injection, MUSE, and placement of the inflatable or malleable penile prosthesis for refractory cases.  In terms of PDE 5 inhibitors, we discussed contraindications for this medication as well as common side effects. Patient was counseled on optimal use. All of his questions were answered in detail.  He will start with sildenafil 20 mg tablets up to 5 tablets at a time.     2. Hypogonadism male Managed by endocrinology    Hollice Espy, MD   [facility] [address], [place], [place] [address] [phone]  I spent 45 total minutes on the day of the encounter including pre-visit review of the medical record, face-to-face time with the patient, and post visit ordering of labs/imaging/tests.

## 2020-11-01 DIAGNOSIS — M5416 Radiculopathy, lumbar region: Secondary | ICD-10-CM | POA: Diagnosis not present

## 2020-11-13 DIAGNOSIS — M545 Low back pain, unspecified: Secondary | ICD-10-CM | POA: Diagnosis not present

## 2020-11-15 ENCOUNTER — Ambulatory Visit: Payer: Self-pay | Admitting: Adult Health

## 2020-11-27 ENCOUNTER — Ambulatory Visit: Payer: Medicare Other | Admitting: Cardiology

## 2020-12-03 DIAGNOSIS — E063 Autoimmune thyroiditis: Secondary | ICD-10-CM | POA: Diagnosis not present

## 2020-12-03 DIAGNOSIS — E039 Hypothyroidism, unspecified: Secondary | ICD-10-CM | POA: Diagnosis not present

## 2020-12-03 DIAGNOSIS — E230 Hypopituitarism: Secondary | ICD-10-CM | POA: Diagnosis not present

## 2020-12-04 DIAGNOSIS — M545 Low back pain, unspecified: Secondary | ICD-10-CM | POA: Diagnosis not present

## 2020-12-07 DIAGNOSIS — E063 Autoimmune thyroiditis: Secondary | ICD-10-CM | POA: Diagnosis not present

## 2020-12-07 DIAGNOSIS — E230 Hypopituitarism: Secondary | ICD-10-CM | POA: Diagnosis not present

## 2020-12-07 DIAGNOSIS — E038 Other specified hypothyroidism: Secondary | ICD-10-CM | POA: Diagnosis not present

## 2020-12-13 DIAGNOSIS — I10 Essential (primary) hypertension: Secondary | ICD-10-CM | POA: Insufficient documentation

## 2020-12-17 NOTE — Progress Notes (Addendum)
Established Patient Office Visit  Subjective:  Patient ID: Timothy Powell, male    DOB: 27-Feb-1976  Age: 45 y.o. MRN: [id number]  CC:  Chief Complaint  Patient presents with  . Follow-up  . Medication Refill    Xanax    HPI Timothy Powell presents for follow up for anxiety. Xanax 0.5 mg po qhs prn. He does not desire any other antidepressant.  He is still taking Anxiety. Needs any other concerns today takes medication only for sleep   He did see Dr. Erlene Quan for urology, was given Sildenafil 20 mg - he prefers Viagra and will would like to switch to Viagra.   Seeing orthopedics still. Injection is starting to wear off. Dr. Tonita Cong at [facility] in [place].  He has started physical therapy and had to stop due to his work.   He has seen Endocrinologist and TSH 16.83.declines treatment.   Patient  denies any fever, chills, rash, chest pain, shortness of breath, nausea, vomiting, or diarrhea.  Denies dizziness, lightheadedness, pre syncopal or syncopal episodes.   .   Past Medical History:  Diagnosis Date  . Cauda equina syndrome ([place])   . Hypertension   . Hypogonadism in male     Past Surgical History:  Procedure Laterality Date  . ankle muscle and skin graft Right 1997  . ANTERIOR CERVICAL DECOMP/DISCECTOMY FUSION N/A 03/23/2017   Procedure: Anterior Cervical Decompresion/Discectomy Fusion - Cervical Four-Cervical five - Cervical five-Cervical six - Cervical six-Cervical seven;  Surgeon: [name], MD;  Location: [facility];  Service: Neurosurgery;  Laterality: N/A;  . ANTERIOR CERVICAL DECOMP/DISCECTOMY FUSION N/A 06/15/2019   Procedure: Cervical seven Thoracic one Anterior cervical decompression/discectomy/fusion with removal of atlantis cervical plate;  Surgeon: [name], MD;  Location: [facility];  Service: Neurosurgery;  Laterality: N/A;  . BACK SURGERY  1997   fusion l4/5,  . ELBOW SURGERY Right 2017   repair tear  . 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 removal of  skin and muscle graft Right 1997   infection  . repair bicep tear Right 2015  . SHOULDER SURGERY  2011  . skin grafts Right    right ankle and arm    Family History  Problem Relation Age of Onset  . Heart disease Maternal Grandfather   . Diabetes Paternal Grandmother   . Pancreatic cancer Paternal Grandmother   . Melanoma Mother   . Anuerysm Father   . Thyroid disease Neg Hx     Social History   Socioeconomic History  . Marital status: Single    Spouse name: Not on file  . Number of children: 1  . Years of education: Not on file  . Highest education level: Bachelor's degree (e.g., BA, AB, BS)  Occupational History  . Occupation: retired  Tobacco Use  . Smoking status: Never Smoker  . Smokeless tobacco: Never Used  Vaping Use  . Vaping Use: Never used  Substance and Sexual Activity  . Alcohol use: Yes    Alcohol/week: 0.0 standard drinks    Comment: social/monthly  . Drug use: No  . Sexual activity: Not on file  Other Topics Concern  . Not on file  Social History Narrative  . Not on file   Social Determinants of Health   Financial Resource Strain: Low Risk   . Difficulty of Paying Living Expenses: Not hard at all  Food Insecurity: No Food Insecurity  . Worried About Charity fundraiser in the Last Year: Never  true  . Ran Out of Food in the Last Year: Never true  Transportation Needs: No Transportation Needs  . Lack of Transportation (Medical): No  . Lack of Transportation (Non-Medical): No  Physical Activity: Sufficiently Active  . Days of Exercise per Week: 5 days  . Minutes of Exercise per Session: 120 min  Stress: No Stress Concern Present  . Feeling of Stress : Not at all  Social Connections: Socially Isolated  . Frequency of Communication with Friends and Family: More than three times a week  . Frequency of Social Gatherings with Friends and Family: More than three times a week  . Attends Religious Services: Never  . Active Member of Clubs or  Organizations: No  . Attends Archivist Meetings: Never  . Marital Status: Never married  Intimate Partner Violence: Not At Risk  . Fear of Current or Ex-Partner: No  . Emotionally Abused: No  . Physically Abused: No  . Sexually Abused: No    Outpatient Medications Prior to Visit  Medication Sig Dispense Refill  . magnesium oxide (MAG-OX) 400 MG tablet Take 400 mg by mouth daily.    . Misc Natural Products (GLUCOSAMINE CHOND COMPLEX/MSM) TABS Take 2 tablets by mouth daily.    . Multiple Vitamin (MULTI-VITAMINS) TABS Take 2 tablets by mouth daily. [place] [facility]    . testosterone cypionate (DEPOTESTOTERONE CYPIONATE) 100 MG/ML injection Inject 100 mg into the muscle every 14 (fourteen) days. For IM use only    . ALPRAZolam (XANAX) 0.5 MG tablet Take 1-2 tablets (0.5-1 mg total) by mouth at bedtime as needed for anxiety. 60 tablet 0  . carvedilol (COREG) 6.25 MG tablet Take 1 tablet (6.25 mg total) by mouth 2 (two) times daily. (Patient not taking: Reported on 12/18/2020) 60 tablet 1  . losartan (COZAAR) 50 MG tablet NOT TAKING:  Take 1 tablet once a day for blood pressure    . sildenafil (REVATIO) 20 MG tablet Take 1 tablet (20 mg total) by mouth as needed. 1-5 tablets as needed 1 hour prior to intercourse (Patient not taking: Reported on 12/18/2020) 30 tablet 11  . valACYclovir (VALTREX) 500 MG tablet Take by mouth.     No facility-administered medications prior to visit.    Allergies  Allergen Reactions  . No Known Allergies     ROS Review of Systems  Constitutional: Negative.   HENT: Negative.   Respiratory: Negative.   Cardiovascular: Negative.   Gastrointestinal: Negative.   Genitourinary: Negative.   Musculoskeletal: Positive for arthralgias, back pain and neck pain.       Chronic sees orthopedics.   Neurological: Negative.   Psychiatric/Behavioral: Negative.       Objective:    Physical Exam Vitals reviewed.  Constitutional:      General: He is not in  acute distress.    Appearance: Normal appearance. He is not ill-appearing, toxic-appearing or diaphoretic.  HENT:     Head: Normocephalic and atraumatic.     Right Ear: External ear normal.     Left Ear: External ear normal.     Nose: Nose normal.     Mouth/Throat:     Pharynx: Oropharynx is clear.  Eyes:     General: No scleral icterus.       Right eye: No discharge.        Left eye: No discharge.     Extraocular Movements: Extraocular movements intact.     Pupils: Pupils are equal, round, and reactive to light.  Cardiovascular:  Rate and Rhythm: Normal rate and regular rhythm.     Pulses: Normal pulses.     Heart sounds: Normal heart sounds.  Pulmonary:     Effort: Pulmonary effort is normal. No respiratory distress.     Breath sounds: Normal breath sounds. No stridor. No wheezing, rhonchi or rales.  Chest:     Chest wall: No tenderness.  Abdominal:     General: There is no distension.     Palpations: Abdomen is soft.     Tenderness: There is no abdominal tenderness.  Musculoskeletal:        General: Normal range of motion.  Neurological:     General: No focal deficit present.     Mental Status: He is oriented to person, place, and time.  Psychiatric:        Mood and Affect: Mood normal.        Behavior: Behavior normal.        Thought Content: Thought content normal.        Judgment: Judgment normal.     BP (!) 130/92 (BP Location: Left Arm, Patient Position: Sitting)   Pulse 76   Temp 98.1 F (36.7 C)   Ht 5\' 10"  (1.778 m)   Wt 207 lb (93.9 kg)   SpO2 96%   BMI 29.70 kg/m  Wt Readings from Last 3 Encounters:  12/18/20 207 lb (93.9 kg)  10/24/20 215 lb (97.5 kg)  10/16/20 209 lb (94.8 kg)     Health Maintenance Due  Topic Date Due  . HIV Screening  Never done  . COLONOSCOPY (Pts 45-84yrs Insurance coverage will need to be confirmed)  Never done    There are no preventive care reminders to display for this patient.  Lab Results  Component Value  Date   TSH 13.500 (H) 08/23/2019   Lab Results  Component Value Date   WBC 6.6 09/05/2020   HGB 17.3 09/05/2020   HCT 51.1 (H) 09/05/2020   MCV 90 09/05/2020   PLT 248 09/05/2020   Lab Results  Component Value Date   NA 137 09/05/2020   K 5.0 09/05/2020   CO2 26 09/05/2020   GLUCOSE 91 09/05/2020   BUN 22 09/05/2020   CREATININE 1.44 (H) 09/05/2020   BILITOT 0.6 09/05/2020   ALKPHOS 63 09/05/2020   AST 36 09/05/2020   ALT 37 09/05/2020   PROT 6.9 09/05/2020   ALBUMIN 4.5 09/05/2020   CALCIUM 9.4 09/05/2020   ANIONGAP 8 06/13/2019   Lab Results  Component Value Date   CHOL 167 08/23/2019   Lab Results  Component Value Date   HDL 30 (L) 08/23/2019   Lab Results  Component Value Date   LDLCALC 117 (H) 08/23/2019   Lab Results  Component Value Date   TRIG 111 08/23/2019   No results found for: CHOLHDL No results found for: HGBA1C    Assessment & Plan:   Problem List Items Addressed This Visit      Cardiovascular and Mediastinum   Essential hypertension, benign   Relevant Medications   losartan (COZAAR) 50 MG tablet     Other   Body mass index 29.0-29.9, adult   Insomnia - Primary   Relevant Medications   ALPRAZolam (XANAX) 0.5 MG tablet    1. Insomnia, unspecified type Uses for sleep only. Takes PRN, working well.  - ALPRAZolam (XANAX) 0.5 MG tablet; Take 1 tablet (0.5 mg total) by mouth at bedtime as needed for anxiety.  Dispense: 90 tablet; Refill: 0  2. Body mass index 29.0-29.9, adult Continues to work out and doing well, very muscular.   3. Essential hypertension, benign Declines treatment has seen cardiology and tried multiple agents with this provider and cardiology, also has untreated hypothyroidism. Declines treatment. Patient verbalizes understanding of risks including but not limited to cardiovascular even and even death. He reports he can not tolerate the side effects of medications. He continues to exercise and work on diet.  Recommend  keeping follow up with endocrinology.  patinet is seeing cardiology as well for hypertension and is currently not on medications as he has not tolerated multiple medication.   Meds ordered this encounter  Medications  . ALPRAZolam (XANAX) 0.5 MG tablet    Sig: Take 1 tablet (0.5 mg total) by mouth at bedtime as needed for anxiety.    Dispense:  90 tablet    Refill:  0    No refill until after 03/20/21.     Return precautions given. Red Flags discussed. The patient was given clear instructions to go to ER or return to medical center if any red flags develop, symptoms do not improve, worsen or new problems develop. They verbalized understanding.    Risks, benefits, and alternatives of the medications and treatment plan prescribed today were discussed, and patient expressed understanding.    Education regarding symptom management and diagnosis given to patient on AVS.  Patient was in agreement with treatment plan.   Continue to follow with  Kelby Aline. Johnisha Louks AGNP-C, FNP-C for routine health maintenance.   Kelby Aline. Tannya Gonet AGNP-C, FNP-C  Follow-up: Return in about 6 months (around 06/20/2021), or if symptoms worsen or fail to improve, for at any time for any worsening symptoms, Go to Emergency room/ urgent care if worse.    Marcille Buffy, FNP

## 2020-12-18 ENCOUNTER — Encounter: Payer: Self-pay | Admitting: Adult Health

## 2020-12-18 ENCOUNTER — Other Ambulatory Visit: Payer: Self-pay

## 2020-12-18 ENCOUNTER — Ambulatory Visit (INDEPENDENT_AMBULATORY_CARE_PROVIDER_SITE_OTHER): Payer: Medicare Other | Admitting: Adult Health

## 2020-12-18 VITALS — BP 130/92 | HR 76 | Temp 98.1°F | Ht 70.0 in | Wt 207.0 lb

## 2020-12-18 DIAGNOSIS — I10 Essential (primary) hypertension: Secondary | ICD-10-CM

## 2020-12-18 DIAGNOSIS — Z6829 Body mass index (BMI) 29.0-29.9, adult: Secondary | ICD-10-CM

## 2020-12-18 DIAGNOSIS — G4700 Insomnia, unspecified: Secondary | ICD-10-CM

## 2020-12-18 MED ORDER — ALPRAZOLAM 0.5 MG PO TABS: 0.5000 mg | ORAL_TABLET | Freq: Every evening | ORAL | 0 refills | Status: DC | PRN

## 2020-12-18 NOTE — Patient Instructions (Signed)
Alprazolam tablets What is this medicine? ALPRAZOLAM (al PRAY zoe lam) is a benzodiazepine. It is used to treat anxiety and panic attacks. This medicine may be used for other purposes; ask your health care provider or pharmacist if you have questions. COMMON BRAND NAME(S): Xanax What should I tell my health care provider before I take this medicine? They need to know if you have any of these conditions:  depression or other mental health disease  history of alcohol or medicine abuse or addiction  kidney disease  liver disease  lung disease, asthma, or breathing problem  seizures  suicidal thoughts, plans or attempt  an unusual or allergic reaction to alprazolam, other benzodiazepines, foods, dyes, or preservatives  pregnant or trying to get pregnant  breast-feeding How should I use this medicine? Take this medicine by mouth. Take it as directed on the prescription label. Do not take it more often than directed. Keep taking it unless your health care provider tells you to stop. A special MedGuide will be given to you by the pharmacist with each prescription and refill. Be sure to read this information carefully each time. Talk to your health care provider about the use of this medicine in children. Special care may be needed. Patients over 29 years of age may have a stronger reaction and need a smaller dose. Overdosage: If you think you have taken too much of this medicine contact a poison control center or emergency room at once. NOTE: This medicine is only for you. Do not share this medicine with others. What if I miss a dose? If you miss a dose, take it as soon as you can. If it is almost time for your next dose, take only that dose. Do not take double or extra doses. What may interact with this medicine? Do not take this medicine with any of the following medications:  certain antivirals for HIV or hepatitis  certain medicines for fungal infections like ketoconazole,  itraconazole, or posaconazole  clarithromycin  grapefruit juice  narcotic medicines for cough  sodium oxybate This medicine may also interact with the following medications:  alcohol  antihistamines for allergy, cough and cold  certain medicines for anxiety or sleep  certain medicines for depression like amitriptyline, fluoxetine, fluvoxamine, nefazodone, sertraline  certain medicines for seizures like carbamazepine, phenobarbital, phenytoin, primidone  cimetidine  digoxin  erythromycin  male hormones, like estrogens or progestins and birth control pills, patches, rings, or injections  general anesthetics like halothane, isoflurane, methoxyflurane, propofol  medicines that relax muscles  narcotic medicines for pain  phenothiazines like chlorpromazine, mesoridazine, prochlorperazine, thioridazine This list may not describe all possible interactions. Give your health care provider a list of all the medicines, herbs, non-prescription drugs, or dietary supplements you use. Also tell them if you smoke, drink alcohol, or use illegal drugs. Some items may interact with your medicine. What should I watch for while using this medicine? Visit your health care provider for regular checks on your progress. Tell your health care provider if your symptoms do not start to get better or if they get worse. Do not stop taking except on your doctor's advice. You may develop a severe reaction. Your doctor will tell you how much medicine to take. You may get drowsy or dizzy. Do not drive, use machinery, or do anything that needs mental alertness until you know how this medicine affects you. To reduce the risk of dizzy and fainting spells, do not stand or sit up quickly, especially if you are  an older patient. Alcohol may increase dizziness and drowsiness. Avoid alcoholic drinks. If you are taking another medicine that also causes drowsiness, you may have more side effects. Give your health care  provider a list of all medicines you use. Your doctor will tell you how much medicine to take. Do not take more medicine than directed. Call emergency for help if you have problems breathing or unusual sleepiness. Women should inform their health care provider if they wish to become pregnant or think they might be pregnant. Do not breast-feed while taking this medicine. Talk to your health care provider for more information. What side effects may I notice from receiving this medicine? Side effects that you should report to your doctor or health care professional as soon as possible:  allergic reactions like skin rash, itching or hives, swelling of the face, lips, or tongue  confusion  loss of balance or coordination  signs and symptoms of low blood pressure like dizziness; feeling faint or lightheaded, falls; unusually weak or tired  suicidal thoughts or other mood changes  trouble breathing  trouble saying things clearly Side effects that usually do not require medical attention (report to your doctor or health care professional if they continue or are bothersome):  changes in sex drive  drowsiness  dry mouth  tiredness This list may not describe all possible side effects. Call your doctor for medical advice about side effects. You may report side effects to FDA at 1-800-FDA-1088. Where should I keep my medicine? Keep out of the reach of children and pets. This medicine can be abused. Keep it in a safe place to protect it from theft. Do not share it with anyone. It is only for you. Selling or giving away this medicine is dangerous and against the law. Store at room temperature between 20 and 25 degrees C (68 and 77 degrees F). Get rid of any unused medicine after the expiration date. This medicine may cause harm and death if it is taken by other adults, children, or pets. It is important to get rid of the medicine as soon as you no longer need it or it is expired. You can do this in  two ways:  Take the medicine to a medicine take-back program. Check with your pharmacy or law enforcement to find a location.  If you cannot return the medicine, check the label or package insert to see if the medicine should be thrown out in the garbage or flushed down the toilet. If you are not sure, ask your health care provider. If it is safe to put it in the trash, take the medicine out of the container. Mix the medicine with cat litter, dirt, coffee grounds, or other unwanted substance. Seal the mixture in a bag or container. Put it in the trash. NOTE: This sheet is a summary. It may not cover all possible information. If you have questions about this medicine, talk to your doctor, pharmacist, or health care provider.  2021 Elsevier/Gold Standard (2020-01-23 15:56:13) Managing Anxiety, Adult After being diagnosed with an anxiety disorder, you may be relieved to know why you have felt or behaved a certain way. You may also feel overwhelmed about the treatment ahead and what it will mean for your life. With care and support, you can manage this condition and recover from it. How to manage lifestyle changes Managing stress and anxiety Stress is your body's reaction to life changes and events, both good and bad. Most stress will last just a few  hours, but stress can be ongoing and can lead to more than just stress. Although stress can play a major role in anxiety, it is not the same as anxiety. Stress is usually caused by something external, such as a deadline, test, or competition. Stress normally passes after the triggering event has ended.  Anxiety is caused by something internal, such as imagining a terrible outcome or worrying that something will go wrong that will devastate you. Anxiety often does not go away even after the triggering event is over, and it can become long-term (chronic) worry. It is important to understand the differences between stress and anxiety and to manage your stress  effectively so that it does not lead to an anxious response. Talk with your health care provider or a counselor to learn more about reducing anxiety and stress. He or she may suggest tension reduction techniques, such as:  Music therapy. This can include creating or listening to music that you enjoy and that inspires you.  Mindfulness-based meditation. This involves being aware of your normal breaths while not trying to control your breathing. It can be done while sitting or walking.  Centering prayer. This involves focusing on a word, phrase, or sacred image that means something to you and brings you peace.  Deep breathing. To do this, expand your stomach and inhale slowly through your nose. Hold your breath for 3-5 seconds. Then exhale slowly, letting your stomach muscles relax.  Self-talk. This involves identifying thought patterns that lead to anxiety reactions and changing those patterns.  Muscle relaxation. This involves tensing muscles and then relaxing them. Choose a tension reduction technique that suits your lifestyle and personality. These techniques take time and practice. Set aside 5-15 minutes a day to do them. Therapists can offer counseling and training in these techniques. The training to help with anxiety may be covered by some insurance plans. Other things you can do to manage stress and anxiety include:  Keeping a stress/anxiety diary. This can help you learn what triggers your reaction and then learn ways to manage your response.  Thinking about how you react to certain situations. You may not be able to control everything, but you can control your response.  Making time for activities that help you relax and not feeling guilty about spending your time in this way.  Visual imagery and yoga can help you stay calm and relax.   Medicines Medicines can help ease symptoms. Medicines for anxiety include:  Anti-anxiety drugs.  Antidepressants. Medicines are often used as a  primary treatment for anxiety disorder. Medicines will be prescribed by a health care provider. When used together, medicines, psychotherapy, and tension reduction techniques may be the most effective treatment. Relationships Relationships can play a big part in helping you recover. Try to spend more time connecting with trusted friends and family members. Consider going to couples counseling, taking family education classes, or going to family therapy. Therapy can help you and others better understand your condition. How to recognize changes in your anxiety Everyone responds differently to treatment for anxiety. Recovery from anxiety happens when symptoms decrease and stop interfering with your daily activities at home or work. This may mean that you will start to:  Have better concentration and focus. Worry will interfere less in your daily thinking.  Sleep better.  Be less irritable.  Have more energy.  Have improved memory. It is important to recognize when your condition is getting worse. Contact your health care provider if your symptoms interfere with  home or work and you feel like your condition is not improving. Follow these instructions at home: Activity  Exercise. Most adults should do the following: ? Exercise for at least 150 minutes each week. The exercise should increase your heart rate and make you sweat (moderate-intensity exercise). ? Strengthening exercises at least twice a week.  Get the right amount and quality of sleep. Most adults need 7-9 hours of sleep each night. Lifestyle  Eat a healthy diet that includes plenty of vegetables, fruits, whole grains, low-fat dairy products, and lean protein. Do not eat a lot of foods that are high in solid fats, added sugars, or salt.  Make choices that simplify your life.  Do not use any products that contain nicotine or tobacco, such as cigarettes, e-cigarettes, and chewing tobacco. If you need help quitting, ask your health  care provider.  Avoid caffeine, alcohol, and certain over-the-counter cold medicines. These may make you feel worse. Ask your pharmacist which medicines to avoid.   General instructions  Take over-the-counter and prescription medicines only as told by your health care provider.  Keep all follow-up visits as told by your health care provider. This is important. Where to find support You can get help and support from these sources:  Self-help groups.  Online and [employer].  A trusted spiritual leader.  Couples counseling.  Family education classes.  Family therapy. Where to find more information You may find that joining a support group helps you deal with your anxiety. The following sources can help you locate counselors or support groups near you:  [place]: www.mentalhealth[place].net  Anxiety and Depression Association of [place] (ADAA): https://www.clark.net/  National Alliance on Mental Illness (NAMI): www.nami.org Contact a health care provider if you:  Have a hard time staying focused or finishing daily tasks.  Spend many hours a day feeling worried about everyday life.  Become exhausted by worry.  Start to have headaches, feel tense, or have nausea.  Urinate more than normal.  Have diarrhea. Get help right away if you have:  A racing heart and shortness of breath.  Thoughts of hurting yourself or others. If you ever feel like you may hurt yourself or others, or have thoughts about taking your own life, get help right away. You can go to your nearest emergency department or call:  Your local emergency services (911 in the [place]  A suicide crisis helpline, such as the [place] at 480-597-9761. This is open 24 hours a day. Summary  Taking steps to learn and use tension reduction techniques can help calm you and help prevent triggering an anxiety reaction.  When used together, medicines, psychotherapy, and  tension reduction techniques may be the most effective treatment.  Family, friends, and partners can play a big part in helping you recover from an anxiety disorder. This information is not intended to replace advice given to you by your health care provider. Make sure you discuss any questions you have with your health care provider. Document Revised: 12/21/2018 Document Reviewed: 12/21/2018 Elsevier Patient Education  2021 [place]. Insomnia Insomnia is a sleep disorder that makes it difficult to fall asleep or stay asleep. Insomnia can cause fatigue, low energy, difficulty concentrating, mood swings, and poor performance at work or school. There are three different ways to classify insomnia:  Difficulty falling asleep.  Difficulty staying asleep.  Waking up too early in the morning. Any type of insomnia can be long-term (chronic) or short-term (acute). Both are common. Short-term  insomnia usually lasts for three months or less. Chronic insomnia occurs at least three times a week for longer than three months. What are the causes? Insomnia may be caused by another condition, situation, or substance, such as:  Anxiety.  Certain medicines.  Gastroesophageal reflux disease (GERD) or other gastrointestinal conditions.  Asthma or other breathing conditions.  Restless legs syndrome, sleep apnea, or other sleep disorders.  Chronic pain.  Menopause.  Stroke.  Abuse of alcohol, tobacco, or illegal drugs.  Mental health conditions, such as depression.  Caffeine.  Neurological disorders, such as Alzheimer's disease.  An overactive thyroid (hyperthyroidism). Sometimes, the cause of insomnia may not be known. What increases the risk? Risk factors for insomnia include:  Gender. Women are affected more often than men.  Age. Insomnia is more common as you get older.  Stress.  Lack of exercise.  Irregular work schedule or working night shifts.  Traveling between  different time zones.  Certain medical and mental health conditions. What are the signs or symptoms? If you have insomnia, the main symptom is having trouble falling asleep or having trouble staying asleep. This may lead to other symptoms, such as:  Feeling fatigued or having low energy.  Feeling nervous about going to sleep.  Not feeling rested in the morning.  Having trouble concentrating.  Feeling irritable, anxious, or depressed. How is this diagnosed? This condition may be diagnosed based on:  Your symptoms and medical history. Your health care provider may ask about: ? Your sleep habits. ? Any medical conditions you have. ? Your mental health.  A physical exam. How is this treated? Treatment for insomnia depends on the cause. Treatment may focus on treating an underlying condition that is causing insomnia. Treatment may also include:  Medicines to help you sleep.  Counseling or therapy.  Lifestyle adjustments to help you sleep better. Follow these instructions at home: Eating and drinking  Limit or avoid alcohol, caffeinated beverages, and cigarettes, especially close to bedtime. These can disrupt your sleep.  Do not eat a large meal or eat spicy foods right before bedtime. This can lead to digestive discomfort that can make it hard for you to sleep.   Sleep habits  Keep a sleep diary to help you and your health care provider figure out what could be causing your insomnia. Write down: ? When you sleep. ? When you wake up during the night. ? How well you sleep. ? How rested you feel the next day. ? Any side effects of medicines you are taking. ? What you eat and drink.  Make your bedroom a dark, comfortable place where it is easy to fall asleep. ? Put up shades or blackout curtains to block light from outside. ? Use a white noise machine to block noise. ? Keep the temperature cool.  Limit screen use before bedtime. This includes: ? Watching TV. ? Using your  smartphone, tablet, or computer.  Stick to a routine that includes going to bed and waking up at the same times every day and night. This can help you fall asleep faster. Consider making a quiet activity, such as reading, part of your nighttime routine.  Try to avoid taking naps during the day so that you sleep better at night.  Get out of bed if you are still awake after 15 minutes of trying to sleep. Keep the lights down, but try reading or doing a quiet activity. When you feel sleepy, go back to bed.   General instructions  Take  over-the-counter and prescription medicines only as told by your health care provider.  Exercise regularly, as told by your health care provider. Avoid exercise starting several hours before bedtime.  Use relaxation techniques to manage stress. Ask your health care provider to suggest some techniques that may work well for you. These may include: ? Breathing exercises. ? Routines to release muscle tension. ? Visualizing peaceful scenes.  Make sure that you drive carefully. Avoid driving if you feel very sleepy.  Keep all follow-up visits as told by your health care provider. This is important. Contact a health care provider if:  You are tired throughout the day.  You have trouble in your daily routine due to sleepiness.  You continue to have sleep problems, or your sleep problems get worse. Get help right away if:  You have serious thoughts about hurting yourself or someone else. If you ever feel like you may hurt yourself or others, or have thoughts about taking your own life, get help right away. You can go to your nearest emergency department or call:  Your local emergency services (911 in the [place]  A suicide crisis helpline, such as the [place] at 224 433 0715. This is open 24 hours a day. Summary  Insomnia is a sleep disorder that makes it difficult to fall asleep or stay asleep.  Insomnia can be long-term  (chronic) or short-term (acute).  Treatment for insomnia depends on the cause. Treatment may focus on treating an underlying condition that is causing insomnia.  Keep a sleep diary to help you and your health care provider figure out what could be causing your insomnia. This information is not intended to replace advice given to you by your health care provider. Make sure you discuss any questions you have with your health care provider. Document Revised: 05/31/2020 Document Reviewed: 05/31/2020 Elsevier Patient Education  2021 [employer].

## 2020-12-25 ENCOUNTER — Encounter: Payer: Self-pay | Admitting: Adult Health

## 2020-12-25 NOTE — Telephone Encounter (Signed)
Spoke with patients pharmacy to see if his prescription was there.[facility] confirmed it was there. Patient notified through [place] on 5/24.

## 2020-12-28 NOTE — Telephone Encounter (Signed)
Placed a call to [facility] to clarify notes for the pharmacy. It has been updated in their system and the patient should be able to pick up prescription. Patient has been informed.

## 2021-01-14 ENCOUNTER — Ambulatory Visit: Payer: Medicare Other | Admitting: Cardiology

## 2021-01-22 DIAGNOSIS — M545 Low back pain, unspecified: Secondary | ICD-10-CM | POA: Diagnosis not present

## 2021-02-14 DIAGNOSIS — M5416 Radiculopathy, lumbar region: Secondary | ICD-10-CM | POA: Diagnosis not present

## 2021-03-25 ENCOUNTER — Encounter: Payer: Self-pay | Admitting: Adult Health

## 2021-03-25 ENCOUNTER — Other Ambulatory Visit: Payer: Self-pay | Admitting: Family

## 2021-03-25 DIAGNOSIS — M792 Neuralgia and neuritis, unspecified: Secondary | ICD-10-CM

## 2021-04-03 ENCOUNTER — Other Ambulatory Visit: Payer: Self-pay

## 2021-04-03 DIAGNOSIS — G4700 Insomnia, unspecified: Secondary | ICD-10-CM

## 2021-04-03 NOTE — Telephone Encounter (Signed)
Last OV-12/18/20 Last sent-12/18/20 Next OV-06/20/21

## 2021-04-04 ENCOUNTER — Other Ambulatory Visit: Payer: Self-pay | Admitting: Family

## 2021-04-04 MED ORDER — ALPRAZOLAM 0.5 MG PO TABS
0.5000 mg | ORAL_TABLET | Freq: Every evening | ORAL | 0 refills | Status: DC | PRN
Start: 2021-04-04 — End: 2021-07-08

## 2021-04-04 NOTE — Telephone Encounter (Signed)
done

## 2021-04-12 NOTE — Telephone Encounter (Signed)
This encounter was opened in error. No patient interaction occurred.   

## 2021-04-24 DIAGNOSIS — M961 Postlaminectomy syndrome, not elsewhere classified: Secondary | ICD-10-CM | POA: Diagnosis not present

## 2021-04-24 DIAGNOSIS — Z79899 Other long term (current) drug therapy: Secondary | ICD-10-CM | POA: Diagnosis not present

## 2021-04-24 DIAGNOSIS — S343XXS Injury of cauda equina, sequela: Secondary | ICD-10-CM | POA: Diagnosis not present

## 2021-04-24 DIAGNOSIS — M5416 Radiculopathy, lumbar region: Secondary | ICD-10-CM | POA: Diagnosis not present

## 2021-04-29 DIAGNOSIS — M5116 Intervertebral disc disorders with radiculopathy, lumbar region: Secondary | ICD-10-CM | POA: Diagnosis not present

## 2021-06-12 DIAGNOSIS — E039 Hypothyroidism, unspecified: Secondary | ICD-10-CM | POA: Diagnosis not present

## 2021-06-12 DIAGNOSIS — E230 Hypopituitarism: Secondary | ICD-10-CM | POA: Diagnosis not present

## 2021-06-20 ENCOUNTER — Ambulatory Visit: Payer: Medicare Other | Admitting: Adult Health

## 2021-07-01 DIAGNOSIS — E230 Hypopituitarism: Secondary | ICD-10-CM | POA: Diagnosis not present

## 2021-07-01 DIAGNOSIS — E039 Hypothyroidism, unspecified: Secondary | ICD-10-CM | POA: Diagnosis not present

## 2021-07-01 DIAGNOSIS — E7800 Pure hypercholesterolemia, unspecified: Secondary | ICD-10-CM | POA: Diagnosis not present

## 2021-07-01 DIAGNOSIS — D75839 Thrombocytosis, unspecified: Secondary | ICD-10-CM | POA: Diagnosis not present

## 2021-07-08 ENCOUNTER — Ambulatory Visit (INDEPENDENT_AMBULATORY_CARE_PROVIDER_SITE_OTHER): Payer: Medicare Other | Admitting: Adult Health

## 2021-07-08 ENCOUNTER — Other Ambulatory Visit: Payer: Self-pay

## 2021-07-08 ENCOUNTER — Encounter: Payer: Self-pay | Admitting: Adult Health

## 2021-07-08 VITALS — BP 150/98 | HR 91 | Temp 97.5°F | Ht 70.0 in | Wt 209.4 lb

## 2021-07-08 DIAGNOSIS — G4700 Insomnia, unspecified: Secondary | ICD-10-CM | POA: Diagnosis not present

## 2021-07-08 DIAGNOSIS — E039 Hypothyroidism, unspecified: Secondary | ICD-10-CM

## 2021-07-08 DIAGNOSIS — I10 Essential (primary) hypertension: Secondary | ICD-10-CM | POA: Diagnosis not present

## 2021-07-08 DIAGNOSIS — E230 Hypopituitarism: Secondary | ICD-10-CM | POA: Diagnosis not present

## 2021-07-08 MED ORDER — ALPRAZOLAM 0.5 MG PO TABS: 0.5000 mg | ORAL_TABLET | Freq: Every evening | ORAL | 0 refills | Status: AC | PRN

## 2021-07-08 NOTE — Patient Instructions (Signed)

## 2021-07-08 NOTE — Progress Notes (Addendum)
Established Patient Office Visit  Subjective:  Patient ID: Timothy Powell, male    DOB: [birth year]  Age: 45 y.o. MRN: [id number]  CC:  Chief Complaint  Patient presents with   Follow-up    HPI [name] presents for follow up for care.  He is seeing pain management MD in [place], he had an injection L5 and this helped. He added Cymbalta and Mobic. He had stopped Cymbalta. He continues Mobic. Awaiting MRI.  He deals with chronic pain from extensive injuries in the past. Cervical spine okay at this time he has chronic degenerative disc.  Has history of surgery in the past with Dr. [name].  Has since had follow-up with orthopedics. He has also seen Dr. Nelva Bush.  Orthopedic  Blood pressure elevated. Does not want to try any other medications.  He was previously referred to cardiology as well and had a couple other blood pressure medications that did not work well for him. Patient also has uncontrolled hypothyroidism, he declines treatment he is followed by endocrinologist.  Blood pressure is elevated today 150/98, patient declines any treatment or therapy.  He continues to be very active with exercise and diet.  He is currently not taking Coreg or Cozaar.  He did see urology who placed him on sildenafil, recommended that he follow-up with cardiology for possible stress test.  He has not had a stress test at this point.  He does take Xanax a.m. 0.5 mg at bedtime as needed for insomnia/anxiety.  This works well for him.  He has tried other antidepressants in the past without good relief and does not desire to be on antidepressant at this time.  He has not been overusing and takes only as directed.  Patient  denies any fever, body aches,chills, rash, chest pain, shortness of breath, nausea, vomiting, or diarrhea.     Past Medical History:  Diagnosis Date   Cauda equina syndrome ([place])    Hypertension    Hypogonadism in male     Past Surgical History:  Procedure Laterality Date    ankle muscle and skin graft Right 1997   ANTERIOR CERVICAL DECOMP/DISCECTOMY FUSION N/A 03/23/2017   Procedure: Anterior Cervical Decompresion/Discectomy Fusion - Cervical Four-Cervical five - Cervical five-Cervical six - Cervical six-Cervical seven;  Surgeon: [name], MD;  Location: [facility];  Service: Neurosurgery;  Laterality: N/A;   ANTERIOR CERVICAL DECOMP/DISCECTOMY FUSION N/A 06/15/2019   Procedure: Cervical seven Thoracic one Anterior cervical decompression/discectomy/fusion with removal of atlantis cervical plate;  Surgeon: [name], MD;  Location: [facility];  Service: Neurosurgery;  Laterality: N/A;   BACK SURGERY  1997   fusion l4/5,   ELBOW SURGERY Right 2017   repair tear   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   removal of skin and muscle graft Right 1997   infection   repair bicep tear Right 2015   SHOULDER SURGERY  2011   skin grafts Right    right ankle and arm    Family History  Problem Relation Age of Onset   Heart disease Maternal Grandfather    Diabetes Paternal Grandmother    Pancreatic cancer Paternal Grandmother    Melanoma Mother    Anuerysm Father    Thyroid disease Neg Hx     Social History   Socioeconomic History   Marital status: Single    Spouse name: Not on file   Number of children: 1   Years of education: Not on file   Highest education level:  Bachelor's degree (e.g., BA, AB, BS)  Occupational History   Occupation: retired  Tobacco Use   Smoking status: Never   Smokeless tobacco: Never  Vaping Use   Vaping Use: Never used  Substance and Sexual Activity   Alcohol use: Yes    Alcohol/week: 0.0 standard drinks    Comment: social/monthly   Drug use: No   Sexual activity: Not on file  Other Topics Concern   Not on file  Social History Narrative   Not on file   Social Determinants of Health   Financial Resource Strain: Not on file  Food Insecurity: Not on file  Transportation Needs: Not on file  Physical Activity: Not on file   Stress: Not on file  Social Connections: Not on file  Intimate Partner Violence: Not on file    Outpatient Medications Prior to Visit  Medication Sig Dispense Refill   magnesium oxide (MAG-OX) 400 MG tablet Take 400 mg by mouth daily.     meloxicam (MOBIC) 15 MG tablet Take 15 mg by mouth daily.     Misc Natural Products (GLUCOSAMINE CHOND COMPLEX/MSM) TABS Take 2 tablets by mouth daily.     Multiple Vitamin (MULTI-VITAMINS) TABS Take 2 tablets by mouth daily. GNC MEGA MEN     testosterone cypionate (DEPOTESTOTERONE CYPIONATE) 100 MG/ML injection Inject 100 mg into the muscle every 14 (fourteen) days. For IM use only     ALPRAZolam (XANAX) 0.5 MG tablet Take 1 tablet (0.5 mg total) by mouth at bedtime as needed for anxiety. 90 tablet 0   carvedilol (COREG) 6.25 MG tablet Take 1 tablet (6.25 mg total) by mouth 2 (two) times daily. (Patient not taking: Reported on 07/08/2021) 60 tablet 1   losartan (COZAAR) 50 MG tablet NOT TAKING:  Take 1 tablet once a day for blood pressure (Patient not taking: Reported on 07/08/2021)     sildenafil (REVATIO) 20 MG tablet Take 1 tablet (20 mg total) by mouth as needed. 1-5 tablets as needed 1 hour prior to intercourse (Patient not taking: Reported on 07/08/2021) 30 tablet 11   valACYclovir (VALTREX) 500 MG tablet Take by mouth. (Patient not taking: Reported on 07/08/2021)     No facility-administered medications prior to visit.    Allergies  Allergen Reactions   No Known Allergies     ROS Review of Systems  Constitutional: Negative.   HENT: Negative.    Respiratory: Negative.    Cardiovascular: Negative.   Genitourinary: Negative.   Musculoskeletal:  Positive for arthralgias.  Neurological:  Positive for weakness and numbness. Negative for dizziness, tremors, seizures, syncope, facial asymmetry, speech difficulty, light-headedness and headaches.       Chronic with previous back surgery.  No signs of cauda equina at this time.  Denies any loss of  bowel or bladder control.  Denies saddle paresthesias.  Denies radiculopathy/ paresthesias.     Hematological: Negative.   Psychiatric/Behavioral: Negative.       Objective:    Physical Exam Constitutional:      General: He is not in acute distress.    Appearance: Normal appearance. He is not ill-appearing, toxic-appearing or diaphoretic.  HENT:     Right Ear: External ear normal.     Left Ear: External ear normal.     Nose: Nose normal.     Mouth/Throat:     Pharynx: Oropharynx is clear.  Eyes:     Extraocular Movements: Extraocular movements intact.     Pupils: Pupils are equal, round, and reactive to light.  Cardiovascular:     Rate and Rhythm: Normal rate and regular rhythm.     Pulses: Normal pulses.     Heart sounds: Normal heart sounds.  Pulmonary:     Effort: Pulmonary effort is normal.     Breath sounds: Normal breath sounds.  Abdominal:     General: There is no distension.     Palpations: Abdomen is soft.  Musculoskeletal:        General: Normal range of motion.     Comments: Doing better with his chronic lower back pain since he had an injection with pain management.  Chronic neck pain but is controlled at this time  Skin:    General: Skin is warm.     Capillary Refill: Capillary refill takes less than 2 seconds.  Neurological:     Mental Status: He is alert and oriented to person, place, and time.     Cranial Nerves: No cranial nerve deficit.     Motor: No weakness.  Psychiatric:        Mood and Affect: Mood normal.        Behavior: Behavior normal.        Judgment: Judgment normal.    BP (!) 150/98 (BP Location: Left Arm, Patient Position: Sitting, Cuff Size: Large)   Pulse 91   Temp (!) 97.5 F (36.4 C) (Temporal)   Ht 5\' 10"  (1.778 m)   Wt 209 lb 6.4 oz (95 kg)   SpO2 97%   BMI 30.05 kg/m  Wt Readings from Last 3 Encounters:  07/08/21 209 lb 6.4 oz (95 kg)  12/18/20 207 lb (93.9 kg)  10/24/20 215 lb (97.5 kg)     There are no preventive  care reminders to display for this patient.   There are no preventive care reminders to display for this patient.  Lab Results  Component Value Date   TSH 13.500 (H) 08/23/2019   Lab Results  Component Value Date   WBC 6.6 09/05/2020   HGB 17.3 09/05/2020   HCT 51.1 (H) 09/05/2020   MCV 90 09/05/2020   PLT 248 09/05/2020   Lab Results  Component Value Date   NA 137 09/05/2020   K 5.0 09/05/2020   CO2 26 09/05/2020   GLUCOSE 91 09/05/2020   BUN 22 09/05/2020   CREATININE 1.44 (H) 09/05/2020   BILITOT 0.6 09/05/2020   ALKPHOS 63 09/05/2020   AST 36 09/05/2020   ALT 37 09/05/2020   PROT 6.9 09/05/2020   ALBUMIN 4.5 09/05/2020   CALCIUM 9.4 09/05/2020   ANIONGAP 8 06/13/2019   Lab Results  Component Value Date   CHOL 167 08/23/2019   Lab Results  Component Value Date   HDL 30 (L) 08/23/2019   Lab Results  Component Value Date   LDLCALC 117 (H) 08/23/2019   Lab Results  Component Value Date   TRIG 111 08/23/2019   No results found for: CHOLHDL No results found for: HGBA1C    Assessment & Plan:   Problem List Items Addressed This Visit       Cardiovascular and Mediastinum   Uncontrolled hypertension     Endocrine   Hypothyroidism     Other   Insomnia - Primary   Relevant Medications   ALPRAZolam (XANAX) 0.5 MG tablet   Declined labs today he has recently had some labs with his endocrinologist. Meds ordered this encounter  Medications   ALPRAZolam (XANAX) 0.5 MG tablet    Sig: Take 1 tablet (0.5 mg total) by  mouth at bedtime as needed for anxiety.    Dispense:  90 tablet    Refill:  0    No refill until after 03/20/21.  Patient has uncontrolled hypertension and hypothyroidism, he understands the correlation of these 2.  He declines medication from endocrinology or cardiology for blood pressure control.  Provider has also offered medication as well.  Patient declines medication or treatment at this time.  He is aware of possible side effects not  limited to organ damage including death or stroke.  Red Flags discussed. The patient was given clear instructions to go to ER or return to medical center if any red flags develop, symptoms do not improve, worsen or new problems develop. They verbalized understanding. Keep follow-up with cardiology, endocrinology, neurosurgery, orthopedics as well as pain management specialist as advised.  He knows should he change his mind about another blood pressure medication that he can call the office and we can consider that as well as hypothyroidism treatment is recommended.  Follow-up: Return in about 6 months (around 01/06/2022), or if symptoms worsen or fail to improve, for at any time for any worsening symptoms, Go to Emergency room/ urgent care if worse.    Marcille Buffy, FNP

## 2021-08-08 DIAGNOSIS — M5416 Radiculopathy, lumbar region: Secondary | ICD-10-CM | POA: Diagnosis not present

## 2021-08-08 DIAGNOSIS — S343XXS Injury of cauda equina, sequela: Secondary | ICD-10-CM | POA: Diagnosis not present

## 2021-08-08 DIAGNOSIS — M961 Postlaminectomy syndrome, not elsewhere classified: Secondary | ICD-10-CM | POA: Diagnosis not present

## 2021-08-12 DIAGNOSIS — M5116 Intervertebral disc disorders with radiculopathy, lumbar region: Secondary | ICD-10-CM | POA: Diagnosis not present

## 2021-08-15 ENCOUNTER — Encounter: Payer: Self-pay | Admitting: Cardiology

## 2021-08-17 DIAGNOSIS — I10 Essential (primary) hypertension: Secondary | ICD-10-CM | POA: Diagnosis not present

## 2021-08-17 DIAGNOSIS — I445 Left posterior fascicular block: Secondary | ICD-10-CM | POA: Diagnosis not present

## 2021-09-04 DIAGNOSIS — I10 Essential (primary) hypertension: Secondary | ICD-10-CM | POA: Diagnosis not present

## 2021-09-04 DIAGNOSIS — M5416 Radiculopathy, lumbar region: Secondary | ICD-10-CM | POA: Diagnosis not present

## 2021-09-04 DIAGNOSIS — S343XXS Injury of cauda equina, sequela: Secondary | ICD-10-CM | POA: Diagnosis not present

## 2021-09-04 DIAGNOSIS — M961 Postlaminectomy syndrome, not elsewhere classified: Secondary | ICD-10-CM | POA: Diagnosis not present

## 2021-09-13 ENCOUNTER — Ambulatory Visit (INDEPENDENT_AMBULATORY_CARE_PROVIDER_SITE_OTHER): Payer: Medicare Other | Admitting: Cardiology

## 2021-09-13 ENCOUNTER — Other Ambulatory Visit: Payer: Self-pay

## 2021-09-13 ENCOUNTER — Encounter: Payer: Self-pay | Admitting: Cardiology

## 2021-09-13 VITALS — BP 160/96 | HR 78 | Ht 70.0 in | Wt 207.0 lb

## 2021-09-13 DIAGNOSIS — E7800 Pure hypercholesterolemia, unspecified: Secondary | ICD-10-CM | POA: Diagnosis not present

## 2021-09-13 DIAGNOSIS — I10 Essential (primary) hypertension: Secondary | ICD-10-CM | POA: Diagnosis not present

## 2021-09-13 MED ORDER — AMLODIPINE BESYLATE 5 MG PO TABS: 5.0000 mg | ORAL_TABLET | Freq: Every day | ORAL | 5 refills | Status: AC

## 2021-09-13 MED ORDER — LISINOPRIL 10 MG PO TABS: 10.0000 mg | ORAL_TABLET | Freq: Every day | ORAL | 3 refills | Status: DC

## 2021-09-13 NOTE — Patient Instructions (Signed)
Medication Instructions:   Your physician has recommended you make the following change in your medication:     START taking Lisinopril 10 mg once a day.   *If you need a refill on your cardiac medications before your next appointment, please call your pharmacy*   Lab Work: None ordered If you have labs (blood work) drawn today and your tests are completely normal, you will receive your results only by: [place] (if you have MyChart) OR A paper copy in the mail If you have any lab test that is abnormal or we need to change your treatment, we will call you to review the results.   Testing/Procedures: None ordered   Follow-Up: At [facility], you and your health needs are our priority.  As part of our continuing mission to provide you with exceptional heart care, we have created designated Provider Care Teams.  These Care Teams include your primary Cardiologist (physician) and Advanced Practice Providers (APPs -  Physician Assistants and Nurse Practitioners) who all work together to provide you with the care you need, when you need it.  We recommend signing up for the patient portal called "MyChart".  Sign up information is provided on this After Visit Summary.  MyChart is used to connect with patients for Virtual Visits (Telemedicine).  Patients are able to view lab/test results, encounter notes, upcoming appointments, etc.  Non-urgent messages can be sent to your provider as well.   To learn more about what you can do with MyChart, go to [email or web address].    Your next appointment:   4-6 week(s)  The format for your next appointment:   In Person  Provider:   You may see Kate Sable, MD or one of the following Advanced Practice Providers on your designated Care Team:   Murray Hodgkins, NP Christell Faith, PA-C Cadence Kathlen Mody, [place]    Other Instructions

## 2021-09-13 NOTE — Progress Notes (Signed)
Cardiology Office Note:    Date:  09/13/2021   ID:  Timothy Powell, DOB [birth year], MRN [id number]  PCP:  Doreen Beam, FNP  [facility] Cardiologist:  Kate Sable, MD  [place] Electrophysiologist:  None   Referring MD: Sharmon Leyden*   Chief Complaint  Patient presents with   OTher    Patient c.o BP being elevated -- Went to [facility] ed because his BP was 220/125. They started him on Amlodipine. Meds reviewed verbally with patient.     History of Present Illness:    Timothy Powell is a 46 y.o. male with a hx of hypertension, hypogonadism who presents for follow-up.  Previously seen due to difficult to control blood pressures.   Started on Coreg but this made him fatigued.  Did not tolerate HCTZ in the past due to cramping.  Recently seen in the ED, amlodipine 5 mg daily was started, patient seems to be tolerating this.  BP still elevated although better.  He feels well, denies chest pain or shortness of breath.  States sleeping better since starting amlodipine.  He stopped using testosterone for about 3 months, blood pressure stays elevated.    Past Medical History:  Diagnosis Date   Cauda equina syndrome ([place])    Hypertension    Hypogonadism in male     Past Surgical History:  Procedure Laterality Date   ankle muscle and skin graft Right 1997   ANTERIOR CERVICAL DECOMP/DISCECTOMY FUSION N/A 03/23/2017   Procedure: Anterior Cervical Decompresion/Discectomy Fusion - Cervical Four-Cervical five - Cervical five-Cervical six - Cervical six-Cervical seven;  Surgeon: [name], MD;  Location: [place];  Service: Neurosurgery;  Laterality: N/A;   ANTERIOR CERVICAL DECOMP/DISCECTOMY FUSION N/A 06/15/2019   Procedure: Cervical seven Thoracic one Anterior cervical decompression/discectomy/fusion with removal of atlantis cervical plate;  Surgeon: [name], MD;  Location: [place];  Service: Neurosurgery;  Laterality: N/A;   BACK SURGERY  1997   fusion  l4/5,   ELBOW SURGERY Right 2017   repair tear   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   lisinopril (ZESTRIL) 10 MG tablet Take 1 tablet (10 mg total) by mouth daily.   magnesium oxide (MAG-OX) 400 MG tablet Take 400 mg by mouth daily.   meloxicam (MOBIC) 15 MG tablet Take 15 mg by mouth daily.   Misc Natural Products (GLUCOSAMINE CHOND COMPLEX/MSM) TABS Take 2 tablets by mouth daily.   Multiple Vitamin (MULTI-VITAMINS) TABS Take 2 tablets by mouth daily. GNC MEGA MEN   testosterone cypionate (DEPOTESTOTERONE CYPIONATE) 100 MG/ML injection Inject 100 mg into the muscle every 14 (fourteen) days. For IM use only     Allergies:   No known allergies   Social History   Socioeconomic History   Marital status: Single    Spouse name: Not on file   Number of children: 1   Years of education: Not on file   Highest education level: Bachelor's degree (e.g., BA, AB, BS)  Occupational History   Occupation: retired  Tobacco Use   Smoking status: Never   Smokeless tobacco:  Never  Vaping Use   Vaping Use: Never used  Substance and Sexual Activity   Alcohol use: Yes    Alcohol/week: 0.0 standard drinks    Comment: social/monthly   Drug use: No   Sexual activity: Not on file  Other Topics Concern   Not on file  Social History Narrative   Not on file   Social Determinants of Health   Financial Resource Strain: Not on file  Food Insecurity: Not on file  Transportation Needs: Not on file  Physical Activity: Not on file  Stress: Not on file  Social Connections: Not on file     Family History: The patient's family history includes Anuerysm in his father; Diabetes in his  paternal grandmother; Heart disease in his maternal grandfather; Melanoma in his mother; Pancreatic cancer in his paternal grandmother. There is no history of Thyroid disease.  ROS:   Please see the history of present illness.     All other systems reviewed and are negative.  EKGs/Labs/Other Studies Reviewed:    The following studies were reviewed today:   EKG:  EKG is ordered today.  EKG shows normal sinus rhythm, normal ECG.  Recent Labs: No results found for requested labs within last 8760 hours.  Recent Lipid Panel    Component Value Date/Time   CHOL 167 08/23/2019 0904   TRIG 111 08/23/2019 0904   HDL 30 (L) 08/23/2019 0904   LDLCALC 117 (H) 08/23/2019 0904     Risk Assessment/Calculations:      Physical Exam:    VS:  BP (!) 160/96 (BP Location: Left Arm, Patient Position: Sitting, Cuff Size: Normal)    Pulse 78    Ht 5\' 10"  (1.778 m)    Wt 207 lb (93.9 kg)    SpO2 99%    BMI 29.70 kg/m     Wt Readings from Last 3 Encounters:  09/13/21 207 lb (93.9 kg)  07/08/21 209 lb 6.4 oz (95 kg)  12/18/20 207 lb (93.9 kg)     GEN:  Well nourished, well developed in no acute distress HEENT: Normal NECK: No JVD; No carotid bruits LYMPHATICS: No lymphadenopathy CARDIAC: RRR, no murmurs, rubs, gallops RESPIRATORY:  Clear to auscultation without rales, wheezing or rhonchi  ABDOMEN: Soft, non-tender, non-distended MUSCULOSKELETAL:  No edema; lower extremity muscular atrophy noted below the knees SKIN: Warm and dry NEUROLOGIC:  Alert and oriented x 3 PSYCHIATRIC:  Normal affect   ASSESSMENT:    1. Primary hypertension   2. Pure hypercholesterolemia     PLAN:    In order of problems listed above:  Hypertension, BP still elevated.  Start lisinopril 10 mg daily, continue Norvasc 5 mg daily.  Previously did not tolerate Coreg or HCTZ.  Consider titrating lisinopril if he tolerates medication and BP still elevated. Elevated LDL, Not in statin benefit group.   Low-cholesterol diet advised.  Follow-up in 1 month.    Medication Adjustments/Labs and Tests Ordered: Current medicines are reviewed at length with the patient today.  Concerns regarding medicines are outlined above.  Orders Placed This Encounter  Procedures   EKG 12-Lead   Meds ordered this encounter  Medications   lisinopril (ZESTRIL) 10 MG tablet    Sig: Take 1 tablet (10 mg total) by mouth daily.    Dispense:  30 tablet    Refill:  3    Patient Instructions  Medication Instructions:   Your physician has recommended you make the following change in your medication:     START  taking Lisinopril 10 mg once a day.   *If you need a refill on your cardiac medications before your next appointment, please call your pharmacy*   Lab Work: None ordered If you have labs (blood work) drawn today and your tests are completely normal, you will receive your results only by: [place] (if you have MyChart) OR A paper copy in the mail If you have any lab test that is abnormal or we need to change your treatment, we will call you to review the results.   Testing/Procedures: None ordered   Follow-Up: At [facility], you and your health needs are our priority.  As part of our continuing mission to provide you with exceptional heart care, we have created designated Provider Care Teams.  These Care Teams include your primary Cardiologist (physician) and Advanced Practice Providers (APPs -  Physician Assistants and Nurse Practitioners) who all work together to provide you with the care you need, when you need it.  We recommend signing up for the patient portal called "MyChart".  Sign up information is provided on this After Visit Summary.  MyChart is used to connect with patients for Virtual Visits (Telemedicine).  Patients are able to view lab/test results, encounter notes, upcoming appointments, etc.  Non-urgent messages can be sent to your provider as well.   To learn more  about what you can do with MyChart, go to [email or web address].    Your next appointment:   4-6 week(s)  The format for your next appointment:   In Person  Provider:   You may see Kate Sable, MD or one of the following Advanced Practice Providers on your designated Care Team:   Murray Hodgkins, NP Christell Faith, PA-C Cadence Kathlen Mody, [place]    Other Instructions     Signed, Kate Sable, MD  09/13/2021 1:08 PM    [place]

## 2021-09-25 ENCOUNTER — Other Ambulatory Visit: Payer: Self-pay | Admitting: Adult Health

## 2021-09-25 DIAGNOSIS — M5117 Intervertebral disc disorders with radiculopathy, lumbosacral region: Secondary | ICD-10-CM | POA: Diagnosis not present

## 2021-09-25 DIAGNOSIS — M4727 Other spondylosis with radiculopathy, lumbosacral region: Secondary | ICD-10-CM | POA: Diagnosis not present

## 2021-09-25 DIAGNOSIS — M5416 Radiculopathy, lumbar region: Secondary | ICD-10-CM

## 2021-09-25 DIAGNOSIS — M961 Postlaminectomy syndrome, not elsewhere classified: Secondary | ICD-10-CM

## 2021-10-02 DIAGNOSIS — M5127 Other intervertebral disc displacement, lumbosacral region: Secondary | ICD-10-CM | POA: Diagnosis not present

## 2021-10-02 DIAGNOSIS — M5416 Radiculopathy, lumbar region: Secondary | ICD-10-CM | POA: Diagnosis not present

## 2021-10-02 DIAGNOSIS — M961 Postlaminectomy syndrome, not elsewhere classified: Secondary | ICD-10-CM | POA: Diagnosis not present

## 2021-10-03 DIAGNOSIS — R0602 Shortness of breath: Secondary | ICD-10-CM | POA: Diagnosis not present

## 2021-10-03 DIAGNOSIS — I10 Essential (primary) hypertension: Secondary | ICD-10-CM | POA: Diagnosis not present

## 2021-10-03 DIAGNOSIS — E039 Hypothyroidism, unspecified: Secondary | ICD-10-CM | POA: Diagnosis not present

## 2021-10-03 DIAGNOSIS — E785 Hyperlipidemia, unspecified: Secondary | ICD-10-CM | POA: Diagnosis not present

## 2021-10-15 DIAGNOSIS — I10 Essential (primary) hypertension: Secondary | ICD-10-CM | POA: Diagnosis not present

## 2021-10-15 DIAGNOSIS — R0602 Shortness of breath: Secondary | ICD-10-CM | POA: Diagnosis not present

## 2021-10-15 DIAGNOSIS — E785 Hyperlipidemia, unspecified: Secondary | ICD-10-CM | POA: Diagnosis not present

## 2021-10-15 DIAGNOSIS — E039 Hypothyroidism, unspecified: Secondary | ICD-10-CM | POA: Diagnosis not present

## 2021-10-22 ENCOUNTER — Ambulatory Visit: Payer: Medicare Other | Admitting: Cardiology

## 2021-11-07 DIAGNOSIS — M5416 Radiculopathy, lumbar region: Secondary | ICD-10-CM | POA: Diagnosis not present

## 2021-11-18 DIAGNOSIS — M5116 Intervertebral disc disorders with radiculopathy, lumbar region: Secondary | ICD-10-CM | POA: Diagnosis not present

## 2021-12-11 DIAGNOSIS — R948 Abnormal results of function studies of other organs and systems: Secondary | ICD-10-CM | POA: Diagnosis not present

## 2021-12-11 DIAGNOSIS — E038 Other specified hypothyroidism: Secondary | ICD-10-CM | POA: Diagnosis not present

## 2021-12-11 DIAGNOSIS — R768 Other specified abnormal immunological findings in serum: Secondary | ICD-10-CM | POA: Diagnosis not present

## 2021-12-11 DIAGNOSIS — E230 Hypopituitarism: Secondary | ICD-10-CM | POA: Diagnosis not present

## 2022-01-08 ENCOUNTER — Other Ambulatory Visit: Payer: Self-pay

## 2022-01-08 ENCOUNTER — Telehealth: Payer: Self-pay | Admitting: Cardiology

## 2022-01-08 MED ORDER — LISINOPRIL 10 MG PO TABS: 10.0000 mg | ORAL_TABLET | Freq: Every day | ORAL | 0 refills | Status: DC

## 2022-01-08 NOTE — Telephone Encounter (Signed)
Patient is out of town and will be for a while Wants to call and schedule later Informed patient to contact PCP for refills

## 2022-01-08 NOTE — Telephone Encounter (Signed)
-----   Message from Horton Finer sent at 01/08/2022 10:20 AM EDT ----- Regarding: needs appt Please reschedule office visit for refills. Patient cancelled last scheduled follow-up appointment. Thank you!

## 2022-01-23 DIAGNOSIS — M5127 Other intervertebral disc displacement, lumbosacral region: Secondary | ICD-10-CM | POA: Diagnosis not present

## 2022-01-23 DIAGNOSIS — M5416 Radiculopathy, lumbar region: Secondary | ICD-10-CM | POA: Diagnosis not present

## 2022-01-23 DIAGNOSIS — S343XXS Injury of cauda equina, sequela: Secondary | ICD-10-CM | POA: Diagnosis not present

## 2022-01-23 DIAGNOSIS — M961 Postlaminectomy syndrome, not elsewhere classified: Secondary | ICD-10-CM | POA: Diagnosis not present

## 2022-01-30 DIAGNOSIS — M5116 Intervertebral disc disorders with radiculopathy, lumbar region: Secondary | ICD-10-CM | POA: Diagnosis not present

## 2022-01-30 DIAGNOSIS — M5416 Radiculopathy, lumbar region: Secondary | ICD-10-CM | POA: Diagnosis not present

## 2022-01-30 DIAGNOSIS — M5127 Other intervertebral disc displacement, lumbosacral region: Secondary | ICD-10-CM | POA: Diagnosis not present

## 2022-02-12 DIAGNOSIS — M5416 Radiculopathy, lumbar region: Secondary | ICD-10-CM | POA: Diagnosis not present

## 2022-02-13 ENCOUNTER — Other Ambulatory Visit: Payer: Self-pay | Admitting: *Deleted

## 2022-02-13 NOTE — Telephone Encounter (Signed)
Patient is out of town and will be for a while Wants to call and schedule later Patient aware to contact PCP for refills

## 2022-02-20 DIAGNOSIS — M5127 Other intervertebral disc displacement, lumbosacral region: Secondary | ICD-10-CM | POA: Diagnosis not present

## 2022-02-20 DIAGNOSIS — S343XXS Injury of cauda equina, sequela: Secondary | ICD-10-CM | POA: Diagnosis not present

## 2022-02-20 DIAGNOSIS — M5416 Radiculopathy, lumbar region: Secondary | ICD-10-CM | POA: Diagnosis not present

## 2022-02-20 DIAGNOSIS — M961 Postlaminectomy syndrome, not elsewhere classified: Secondary | ICD-10-CM | POA: Diagnosis not present

## 2022-02-25 DIAGNOSIS — M19072 Primary osteoarthritis, left ankle and foot: Secondary | ICD-10-CM | POA: Diagnosis not present

## 2022-02-25 DIAGNOSIS — M25572 Pain in left ankle and joints of left foot: Secondary | ICD-10-CM | POA: Diagnosis not present

## 2022-02-25 DIAGNOSIS — M25472 Effusion, left ankle: Secondary | ICD-10-CM | POA: Diagnosis not present

## 2022-02-26 DIAGNOSIS — M19072 Primary osteoarthritis, left ankle and foot: Secondary | ICD-10-CM | POA: Diagnosis not present

## 2022-02-26 DIAGNOSIS — M25572 Pain in left ankle and joints of left foot: Secondary | ICD-10-CM | POA: Diagnosis not present

## 2022-02-26 DIAGNOSIS — R600 Localized edema: Secondary | ICD-10-CM | POA: Diagnosis not present

## 2022-03-03 DIAGNOSIS — M25572 Pain in left ankle and joints of left foot: Secondary | ICD-10-CM | POA: Diagnosis not present

## 2022-04-02 DIAGNOSIS — M5116 Intervertebral disc disorders with radiculopathy, lumbar region: Secondary | ICD-10-CM | POA: Diagnosis not present

## 2022-04-18 DIAGNOSIS — S343XXS Injury of cauda equina, sequela: Secondary | ICD-10-CM | POA: Diagnosis not present

## 2022-04-18 DIAGNOSIS — M5127 Other intervertebral disc displacement, lumbosacral region: Secondary | ICD-10-CM | POA: Diagnosis not present

## 2022-04-18 DIAGNOSIS — M5416 Radiculopathy, lumbar region: Secondary | ICD-10-CM | POA: Diagnosis not present

## 2022-04-18 DIAGNOSIS — M961 Postlaminectomy syndrome, not elsewhere classified: Secondary | ICD-10-CM | POA: Diagnosis not present

## 2022-06-03 DIAGNOSIS — M5116 Intervertebral disc disorders with radiculopathy, lumbar region: Secondary | ICD-10-CM | POA: Diagnosis not present

## 2022-06-24 DIAGNOSIS — Z79899 Other long term (current) drug therapy: Secondary | ICD-10-CM | POA: Diagnosis not present

## 2022-06-24 DIAGNOSIS — E230 Hypopituitarism: Secondary | ICD-10-CM | POA: Diagnosis not present

## 2022-06-25 DIAGNOSIS — M961 Postlaminectomy syndrome, not elsewhere classified: Secondary | ICD-10-CM | POA: Diagnosis not present

## 2022-06-25 DIAGNOSIS — S343XXS Injury of cauda equina, sequela: Secondary | ICD-10-CM | POA: Diagnosis not present

## 2022-06-25 DIAGNOSIS — M5416 Radiculopathy, lumbar region: Secondary | ICD-10-CM | POA: Diagnosis not present

## 2022-06-25 DIAGNOSIS — M5127 Other intervertebral disc displacement, lumbosacral region: Secondary | ICD-10-CM | POA: Diagnosis not present

## 2022-07-08 DIAGNOSIS — Z872 Personal history of diseases of the skin and subcutaneous tissue: Secondary | ICD-10-CM | POA: Diagnosis not present

## 2022-07-08 DIAGNOSIS — S91001A Unspecified open wound, right ankle, initial encounter: Secondary | ICD-10-CM | POA: Diagnosis not present

## 2022-07-08 DIAGNOSIS — T148XXA Other injury of unspecified body region, initial encounter: Secondary | ICD-10-CM | POA: Diagnosis not present

## 2022-07-08 DIAGNOSIS — L089 Local infection of the skin and subcutaneous tissue, unspecified: Secondary | ICD-10-CM | POA: Diagnosis not present

## 2022-07-23 ENCOUNTER — Encounter: Payer: Medicare Other | Attending: Internal Medicine | Admitting: Internal Medicine

## 2022-07-23 DIAGNOSIS — M5416 Radiculopathy, lumbar region: Secondary | ICD-10-CM | POA: Insufficient documentation

## 2022-07-23 DIAGNOSIS — M5412 Radiculopathy, cervical region: Secondary | ICD-10-CM | POA: Diagnosis not present

## 2022-07-23 DIAGNOSIS — T798XXA Other early complications of trauma, initial encounter: Secondary | ICD-10-CM

## 2022-07-23 DIAGNOSIS — L03115 Cellulitis of right lower limb: Secondary | ICD-10-CM | POA: Diagnosis not present

## 2022-07-23 DIAGNOSIS — L97812 Non-pressure chronic ulcer of other part of right lower leg with fat layer exposed: Secondary | ICD-10-CM | POA: Insufficient documentation

## 2022-07-23 DIAGNOSIS — I10 Essential (primary) hypertension: Secondary | ICD-10-CM | POA: Insufficient documentation

## 2022-07-23 NOTE — Progress Notes (Signed)
Timothy, Powell ([id number]) [id number] Page 1 of 8 Visit Report for 07/23/2022 Chief Complaint Document Details Patient Name: Date of Service: Timothy Powell, Timothy Powell 07/23/2022 8:45 A M Medical Record Number: [id number] Patient Account Number: [id number] Date of Birth/Sex: Treating RN: 11/07/1975 (46 y.o. Timothy Powell) Carlene Coria Primary Care Provider: PA Haig Prophet, [place] Other Clinician: Referring Provider: Treating Provider/Extender: Ramiro Harvest in Treatment: 0 Information Obtained from: Patient Chief Complaint 07/23/2022; distal right lower extremity wound Electronic Signature(s) Signed: 07/23/2022 11:07:53 AM By: Kalman Shan DO Entered By: Kalman Shan on 07/23/2022 09:59:41 -------------------------------------------------------------------------------- Debridement Details Patient Name: Date of Service: Timothy Roup R. 07/23/2022 8:45 A M Medical Record Number: [id number] Patient Account Number: [id number] Date of Birth/Sex: Treating RN: 1976-06-25 (46 y.o. Timothy Powell) Carlene Coria Primary Care Provider: PA Haig Prophet, [place] Other Clinician: Referring Provider: Treating Provider/Extender: Ramiro Harvest in Treatment: 0 Debridement Performed for Assessment: Wound #1 Right,Lateral Ankle Performed By: Physician Kalman Shan, MD Debridement Type: Chemical/Enzymatic/Mechanical Agent Used: saline and gauze Level of Consciousness (Pre-procedure): Awake and Alert Pre-procedure Verification/Time Out Yes - 09:48 Taken: Start Time: 09:48 Instrument: Other : saline gauze Bleeding: Minimum Hemostasis Achieved: Pressure End Time: 09:50 Procedural Pain: 0 Post Procedural Pain: 0 Response to Treatment: Procedure was tolerated well Level of Consciousness (Post- Awake and Alert procedure): Post Debridement Measurements of Total Wound SIRIUS, WOODFORD R ([id number]) [id number] Page 2 of 8 Length: (cm)  3 Width: (cm) 2 Depth: (cm) 0.1 Volume: (cm) 0.471 Character of Wound/Ulcer Post Debridement: Improved Post Procedure Diagnosis Same as Pre-procedure Electronic Signature(s) Signed: 07/23/2022 11:07:53 AM By: Kalman Shan DO Signed: 07/23/2022 4:14:17 PM By: Carlene Coria RN Entered By: Carlene Coria on 07/23/2022 09:48:43 -------------------------------------------------------------------------------- HPI Details Patient Name: Date of Service: Timothy Roup R. 07/23/2022 8:45 A M Medical Record Number: [id number] Patient Account Number: [id number] Date of Birth/Sex: Treating RN: 1975/12/16 (46 y.o. [name] Primary Care Provider: PA Haig Prophet, [place] Other Clinician: Referring Provider: Treating Provider/Extender: Ramiro Harvest in Treatment: 0 History of Present Illness HPI Description: 07/23/2022 Mr. Timothy Powell is a 46 year old male with a past medical history of essential hypertension, cervical and lumbar radiculopathy that presents to the clinic for a 43-monthhistory of nonhealing ulcer to the right lateral ankle. He states that he has had trauma to this area requiring a muscle flap several years ago. He describes what sounds like a history of osteomyelitis to this area. He states that eventually the area healed. He recently developed a wound and is not quite sure how it started. He has been using mupirocin ointment to the area. He required oral antibiotics by his primary care physician. He states he finished this 1 week ago. He does not recall the name of the antibiotics. Currently he denies signs of infection. He states that the wound appears healing to him. Electronic Signature(s) Signed: 07/23/2022 11:07:53 AM By: HKalman ShanDO Entered By: HKalman Shanon 07/23/2022 10:04:06 -------------------------------------------------------------------------------- Physical Exam Details Patient Name: Date of Service: Powell, [name]12/20/2023  8:45 A M Medical Record Number: [id number] Account Number: [id number] of Birth/Sex: Treating RN: [birth year](46 y.o. MJerilynn Powell ECarlene CoriaPrimary Care Provider: PA THaig Prophet [place] Clinician: Referring Provider: Treating Provider/Extender: HRamiro Harvestin Treatment:JAKHARI, SPACER ([id number] Page 3 of 8 Constitutional . Cardiovascular . Psychiatric . Notes T the distal aspect of the right lower extremity on a muscle flap over his lateral ankle  there are 2 open wounds with granulation tissue and nonviable tissue. No o signs of surrounding infection including increased warmth, erythema or purulent drainage. Electronic Signature(s) Signed: 07/23/2022 11:07:53 AM By: Kalman Shan DO Entered By: Kalman Shan on 07/23/2022 10:06:26 -------------------------------------------------------------------------------- Physician Orders Details Patient Name: Date of Service: Timothy Roup R. 07/23/2022 8:45 A M Medical Record Number: [id number] Patient Account Number: [id number] Date of Birth/Sex: Treating RN: November 04, 1975 (46 y.o. Timothy Powell) Carlene Coria Primary Care Provider: PA Haig Prophet, [place] Other Clinician: Referring Provider: Treating Provider/Extender: Ramiro Harvest in Treatment: 0 Verbal / Phone Orders: No Diagnosis Coding ICD-10 Coding Code Description [phone] Non-pressure chronic ulcer of other part of right lower leg with fat layer exposed [place] (primary) hypertension Follow-up Appointments Return Appointment in 1 week. Bathing/ Shower/ Hygiene May shower; gently cleanse wound with antibacterial soap, rinse and pat dry prior to dressing wounds Anesthetic (Use 'Patient Medications' Section for Anesthetic Order Entry) Lidocaine applied to wound bed Edema Control - Lymphedema / Segmental Compressive Device / Other Elevate, Exercise Daily and A void Standing for Long Periods of  Time. Elevate legs to the level of the heart and pump ankles as often as possible Elevate leg(s) parallel to the floor when sitting. Wound Treatment Wound #1 - Ankle Wound Laterality: Right, Lateral Cleanser: Byram Ancillary Kit - 15 Day Supply (DME) (Generic) 1 x Per Day/30 Days Discharge Instructions: Use supplies as instructed; Kit contains: (15) Saline Bullets; (15) 3x3 Gauze; 15 pr Gloves Prim Dressing: Honey: Activon Honey Gel, 25 (g) Tube ary 1 x Per Day/30 Days Prim Dressing: Hydrofera Blue Ready Transfer Foam, 2.5x2.5 (in/in) 1 x Per Day/30 Days ary Discharge Instructions: Apply Hydrofera Blue Ready to wound bed as directed Secondary Dressing: (BORDER) Zetuvit Plus SILICONE BORDER Dressing 5x5 (in/in) (DME) (Generic) 1 x Per Day/30 Days Discharge Instructions: Please do not put silicone bordered dressings under wraps. Use non-bordered dressing only. MARLYN, RABINE ([id number]) [id number] Page 4 of 8 Secured With: Tubigrip Size C, 2.75x10 (in/yd) 1 x Per Day/30 Days Discharge Instructions: double Apply 3 Tubigrip C 3-finger-widths below knee to base of toes to secure dressing and/or for swelling. Electronic Signature(s) Signed: 07/23/2022 11:07:53 AM By: Kalman Shan DO Entered By: Kalman Shan on 07/23/2022 10:09:38 -------------------------------------------------------------------------------- Problem List Details Patient Name: Date of Service: Timothy Roup R. 07/23/2022 8:45 A M Medical Record Number: [id number] Patient Account Number: [id number] Date of Birth/Sex: Treating RN: [birth year] (46 y.o. Timothy Powell) Carlene Coria Primary Care Provider: PA Haig Prophet, [place] Other Clinician: Referring Provider: Treating Provider/Extender: Ramiro Harvest in Treatment: 0 Active Problems ICD-10 Encounter Code Description Active Date MDM Diagnosis L97.812 Non-pressure chronic ulcer of other part of right lower leg with fat layer 07/23/2022  No Yes exposed [place] (primary) hypertension 07/23/2022 No Yes T79.8XXA Other early complications of trauma, initial encounter 07/23/2022 No Yes M54.12 Radiculopathy, cervical region 07/23/2022 No Yes M54.16 Radiculopathy, lumbar region 07/23/2022 No Yes Inactive Problems Resolved Problems Electronic Signature(s) Signed: 07/23/2022 11:07:53 AM By: Kalman Shan DO Entered By: Kalman Shan on 07/23/2022 09:59:02 [name] ([id number]) [id number] Page 5 of 8 -------------------------------------------------------------------------------- Progress Note Details Patient Name: Date of Service: AUGUSTO, DECKMAN 07/23/2022 8:45 A M Medical Record Number: [id number] Patient Account Number: [id number] Date of Birth/Sex: Treating RN: [birth year] (46 y.o. Timothy Powell) Carlene Coria Primary Care Provider: PA Haig Prophet, [place] Other Clinician: Referring Provider: Treating Provider/Extender: Ramiro Harvest in Treatment: 0 Subjective Chief Complaint Information obtained from Patient 07/23/2022;  distal right lower extremity wound History of Present Illness (HPI) 07/23/2022 Mr. Tru Rana is a 46 year old male with a past medical history of essential hypertension, cervical and lumbar radiculopathy that presents to the clinic for a 58-monthhistory of nonhealing ulcer to the right lateral ankle. He states that he has had trauma to this area requiring a muscle flap several years ago. He describes what sounds like a history of osteomyelitis to this area. He states that eventually the area healed. He recently developed a wound and is not quite sure how it started. He has been using mupirocin ointment to the area. He required oral antibiotics by his primary care physician. He states he finished this 1 week ago. He does not recall the name of the antibiotics. Currently he denies signs of infection. He states that the wound appears healing to him. Patient  History Allergies No Known Allergies Social History Never smoker, Marital Status - Single, Drug Use - No History, Caffeine Use - Daily. Medical History Cardiovascular Patient has history of Hypertension Review of Systems (ROS) Integumentary (Skin) Complains or has symptoms of Wounds. Objective Constitutional Vitals Time Taken: 9:10 AM, Height: 70 in, Source: Stated, Weight: 200 lbs, Source: Stated, BMI: 28.7, Temperature: 98.2 F, Pulse: 76 bpm, Respiratory Rate: 18 breaths/min, Blood Pressure: 191/105 mmHg. General Notes: T the distal aspect of the right lower extremity on a muscle flap over his lateral ankle there are 2 open wounds with granulation tissue and o nonviable tissue. No signs of surrounding infection including increased warmth, erythema or purulent drainage. Integumentary (Hair, Skin) Wound #1 status is Open. Original cause of wound was Gradually Appeared. The date acquired was: 04/29/2022. The wound is located on the Right,Lateral Ankle. The wound measures 3cm length x 2cm width x 0.1cm depth; 4.712cm^2 area and 0.471cm^3 volume. There is no tunneling or undermining noted. There is a medium amount of serosanguineous drainage noted. There is no granulation within the wound bed. There is a large (67-100%) amount of necrotic tissue within the wound bed including Adherent Slough. Assessment BHARSHAN, KEARLEY([id number] Page 6 of 8 Active Problems ICD-10 Non-pressure chronic ulcer of other part of right lower leg with fat layer exposed Essential (primary) hypertension Other early complications of trauma, initial encounter Radiculopathy, cervical region Radiculopathy, lumbar region Patient presents with a 361-monthistory of nonhealing ulcer to his right lower extremity secondary to likely trauma and complicated by prior history of probable osteomyelitis requiring muscle flap. Thw skin is fairly weakend due to his surgical history. I  recommended using Medihoney and Hydrofera Blue and padding to keep this area protected. His ABIs were 0.94 with strong pedal pulses. He should have adequate blood flow for healing. He does have mild swelling to the ankle and would benefit from compression. I recommend at this time to do Tubigrip. Follow-up in 1 week. He knows to call with any questions or concerns. Procedures Wound #1 Pre-procedure diagnosis of Wound #1 is an Atypical located on the Right,Lateral Ankle . There was a Chemical/Enzymatic/Mechanical debridement performed by HoKalman ShanMD. With the following instrument(s): saline gauze. Other agent used was saline and gauze. A time out was conducted at 09:48, prior to the start of the procedure. A Minimum amount of bleeding was controlled with Pressure. The procedure was tolerated well with a pain level of 0 throughout and a pain level of 0 following the procedure. Post Debridement Measurements: 3cm length x 2cm width x 0.1cm depth; 0.471cm^3 volume. Character of Wound/Ulcer Post Debridement is  improved. Post procedure Diagnosis Wound #1: Same as Pre-Procedure Plan Follow-up Appointments: Return Appointment in 1 week. Bathing/ Shower/ Hygiene: May shower; gently cleanse wound with antibacterial soap, rinse and pat dry prior to dressing wounds Anesthetic (Use 'Patient Medications' Section for Anesthetic Order Entry): Lidocaine applied to wound bed Edema Control - Lymphedema / Segmental Compressive Device / Other: Elevate, Exercise Daily and Avoid Standing for Long Periods of Time. Elevate legs to the level of the heart and pump ankles as often as possible Elevate leg(s) parallel to the floor when sitting. WOUND #1: - Ankle Wound Laterality: Right, Lateral Cleanser: Byram Ancillary Kit - 15 Day Supply (DME) (Generic) 1 x Per Day/30 Days Discharge Instructions: Use supplies as instructed; Kit contains: (15) Saline Bullets; (15) 3x3 Gauze; 15 pr Gloves Prim Dressing: Honey:  Activon Honey Gel, 25 (g) Tube 1 x Per Day/30 Days ary Prim Dressing: Hydrofera Blue Ready Transfer Foam, 2.5x2.5 (in/in) 1 x Per Day/30 Days ary Discharge Instructions: Apply Hydrofera Blue Ready to wound bed as directed Secondary Dressing: (BORDER) Zetuvit Plus SILICONE BORDER Dressing 5x5 (in/in) (DME) (Generic) 1 x Per Day/30 Days Discharge Instructions: Please do not put silicone bordered dressings under wraps. Use non-bordered dressing only. Secured With: Tubigrip Size C, 2.75x10 (in/yd) 1 x Per Day/30 Days Discharge Instructions: double Apply 3 Tubigrip C 3-finger-widths below knee to base of toes to secure dressing and/or for swelling. 1. Medihoney and Hydrofera Blue 2. Tubigrip 3. Follow-up in 1 week Electronic Signature(s) Signed: 07/23/2022 11:07:53 AM By: Kalman Shan DO Entered By: Kalman Shan on 07/23/2022 10:08:59 ROS/PFSH Details -------------------------------------------------------------------------------- [name] ([id number]) [id number] Page 7 of 8 Patient Name: Date of Service: VINAL, ROSENGRANT 07/23/2022 8:45 A M Medical Record Number: [id number] Patient Account Number: [id number] Date of Birth/Sex: Treating RN: August 05, 1975 (46 y.o. Timothy Powell) Carlene Coria Primary Care Provider: PA Haig Prophet, [place] Other Clinician: Referring Provider: Treating Provider/Extender: Ramiro Harvest in Treatment: 0 Integumentary (Skin) Complaints and Symptoms: Positive for: Wounds Cardiovascular Medical History: Positive for: Hypertension Immunizations Pneumococcal Vaccine: Received Pneumococcal Vaccination: No Implantable Devices None Family and Social History Never smoker; Marital Status - Single; Drug Use: No History; Caffeine Use: Daily Electronic Signature(s) Signed: 07/23/2022 11:07:53 AM By: Kalman Shan DO Signed: 07/23/2022 4:14:17 PM By: Carlene Coria RN Entered By: Carlene Coria on 07/23/2022  09:12:08 -------------------------------------------------------------------------------- SuperBill Details Patient Name: Date of Service: Lowell Bouton 07/23/2022 Medical Record Number: [id number] Patient Account Number: [id number] Date of Birth/Sex: Treating RN: May 08, 1976 (46 y.o. [name] Primary Care Provider: PA Haig Prophet, [place] Other Clinician: Referring Provider: Treating Provider/Extender: Ramiro Harvest in Treatment: 0 Diagnosis Coding ICD-10 Codes Code Description [phone] Non-pressure chronic ulcer of other part of right lower leg with fat layer exposed I10 Essential (primary) hypertension T79.8XXA Other early complications of trauma, initial encounter M54.12 Radiculopathy, cervical region M54.16 Radiculopathy, lumbar region Facility Procedures : CPT4 Code: [id number] Description: [facility] VISIT-LEV 3 EST PT Modifier: Quantity: 1 Physician Procedures TOBY, BREITHAUPT ([id number]): CPT4 Code Description [id number] - WC PHYS LEVEL 4 - NEW PT ICD-10 Diagnosis Description C62.376 Non-pressure chronic ulcer of other part of right lower leg with f I10 Essential (primary) hypertension T79.8XXA Other early  complications of trauma, initial encounter M54.12 Radiculopathy, cervical region [id number] Page 8 of 8: Quantity Modifier 1 at layer exposed Electronic Signature(s) Signed: 07/23/2022 11:07:53 AM By: Kalman Shan DO Previous Signature: 07/23/2022 10:08:19 AM Version By: Carlene Coria RN Entered By: Kalman Shan  on 07/23/2022 10:09:18

## 2022-07-23 NOTE — Progress Notes (Signed)
Timothy, Powell ([id number]) [id number] Page 1 of 10 Visit Report for 07/23/2022 Allergy List Details Patient Name: Date of Service: Timothy Powell, Timothy Powell 07/23/2022 8:45 A M Medical Record Number: [id number] Patient Account Number: [id number] Date of Birth/Sex: Treating RN: [birth year] (46 y.o. Timothy Powell) Carlene Coria Primary Care Zierra Laroque: PA Haig Prophet, [place] Other Clinician: Referring Gevorg Brum: Treating Dashel Goines/Extender: Ramiro Harvest in Treatment: 0 Allergies Active Allergies No Known Allergies Allergy Notes Electronic Signature(s) Signed: 07/23/2022 4:14:17 PM By: Carlene Coria RN Entered By: Carlene Coria on 07/23/2022 09:10:55 -------------------------------------------------------------------------------- Arrival Information Details Patient Name: Date of Service: Timothy Roup R. 07/23/2022 8:45 A M Medical Record Number: [id number] Patient Account Number: [id number] Date of Birth/Sex: Treating RN: 1976-04-12 (46 y.o. [name] Primary Care Araina Butrick: PA Haig [place], [place] Other Clinician: Referring Carol Loftin: Treating Azhar Knope/Extender: Ramiro Harvest in Treatment: 0 Visit Information Patient Arrived: Ambulatory Arrival Time: 09:09 Accompanied By: self Transfer Assistance: None Patient Identification Verified: Yes Secondary Verification Process Completed: Yes Patient Requires Transmission-Based Precautions: No Patient Has Alerts: No Electronic Signature(s) Signed: 07/23/2022 9:38:21 AM By: Rosalio Loud MSN RN CNS WTA Entered By: Rosalio Loud on 07/23/2022 09:38:21 [name] ([id number]) [id number] Page 2 of 10 -------------------------------------------------------------------------------- Clinic Level of Care Assessment Details Patient Name: Date of Service: Timothy, Powell 07/23/2022 8:45 A M Medical Record Number: [id number] Patient Account Number: [id number] Date of Birth/Sex:  Treating RN: [birth year] (46 y.o. Timothy Powell) Carlene Coria Primary Care Karita Dralle: PA Haig Prophet, [place] Other Clinician: Referring Jorel Gravlin: Treating Makeda Peeks/Extender: Ramiro Harvest in Treatment: 0 Clinic Level of Care Assessment Items TOOL 2 Quantity Score X- 1 0 Use when only an EandM is performed on the INITIAL visit ASSESSMENTS - Nursing Assessment / Reassessment X- 1 20 General Physical Exam (combine w/ comprehensive assessment (listed just below) when performed on new pt. evals) X- 1 25 Comprehensive Assessment (HX, ROS, Risk Assessments, Wounds Hx, etc.) ASSESSMENTS - Wound and Skin A ssessment / Reassessment X - Simple Wound Assessment / Reassessment - one wound 1 5 _0  - 0 Complex Wound Assessment / Reassessment - multiple wounds _1  - 0 Dermatologic / Skin Assessment (not related to wound area) ASSESSMENTS - Ostomy and/or Continence Assessment and Care _2  - 0 Incontinence Assessment and Management _3  - 0 Ostomy Care Assessment and Management (repouching, etc.) PROCESS - Coordination of Care X - Simple Patient / Family Education for ongoing care 1 15 _4  - 0 Complex (extensive) Patient / Family Education for ongoing care _5  - 0 Staff obtains Programmer, systems, Records, T Results / Process Orders est _6  - 0 Staff telephones HHA, Nursing Homes / Clarify orders / etc _7  - 0 Routine Transfer to another Facility (non-emergent condition) _8  - 0 Routine Hospital Admission (non-emergent condition) _9  - 0 New Admissions / Biomedical engineer / Ordering NPWT Apligraf, etc. , _10  - 0 Emergency Hospital Admission (emergent condition) X- 1 10 Simple Discharge Coordination _11  - 0 Complex (extensive) Discharge Coordination PROCESS - Special Needs _12  - 0 Pediatric / Minor Patient Management _13  - 0 Isolation Patient Management _14  - 0 Hearing / Language / Visual special needs _15  - 0 Assessment of Community assistance (transportation, D/C planning, etc.) _16  - 0 Additional  assistance / Altered mentation _17  - 0 Support Surface(s) Assessment (bed, cushion, seat, etc.) INTERVENTIONS - Wound Cleansing / Measurement X- 1 5 Wound Imaging (photographs - any number of wounds) Timothy, Powell R ([id number]) [id number] Page 3 of 10 _18  - 0 Wound Tracing (  instead of photographs) X- 1 5 Simple Wound Measurement - one wound _0  - 0 Complex Wound Measurement - multiple wounds X- 1 5 Simple Wound Cleansing - one wound _1  - 0 Complex Wound Cleansing - multiple wounds INTERVENTIONS - Wound Dressings X - Small Wound Dressing one or multiple wounds 1 10 _2  - 0 Medium Wound Dressing one or multiple wounds _3  - 0 Large Wound Dressing one or multiple wounds <[id number]>_5<\/[id number]>_1  - 0 Application of Medications - injection INTERVENTIONS - Miscellaneous _5  - 0 External ear exam _6  - 0 Specimen Collection (cultures, biopsies, blood, body fluids, etc.) _7  - 0 Specimen(s) / Culture(s) sent or taken to Lab for analysis _8  - 0 Patient Transfer (multiple staff / Civil Service fast streamer / Similar devices) _9  - 0 Simple Staple / Suture removal (25 or less) _10  - 0 Complex Staple / Suture removal (26 or more) _11  - 0 Hypo / Hyperglycemic Management (close monitor of Blood Glucose) X- 1 15 Ankle / Brachial Index (ABI) - do not check if billed separately Has the patient been seen at the hospital within the last three years: Yes Total Score: 115 Level Of Care: New/Established - Level 3 Electronic Signature(s) Signed: 07/23/2022 4:14:17 PM By: Carlene Coria RN Entered By: Carlene Coria on 07/23/2022 10:08:05 -------------------------------------------------------------------------------- Encounter Discharge Information Details Patient Name: Date of Service: Timothy Roup R. 07/23/2022 8:45 A M Medical Record Number: [id number] Patient Account Number: [id number] Date of Birth/Sex: Treating RN: 1976-06-10 (46 y.o. Timothy Powell) Carlene Coria Primary Care Danetra Glock: PA Haig [place], [place] Other  Clinician: Referring Kenecia Barren: Treating Husein Guedes/Extender: Ramiro Harvest in Treatment: 0 Encounter Discharge Information Items Post Procedure Vitals Discharge Condition: Stable Temperature (F): 98.2 Ambulatory Status: Ambulatory Pulse (bpm): 76 Discharge Destination: Home Respiratory Rate (breaths/min): 18 Transportation: Private Auto Blood Pressure (mmHg): 191/105 Accompanied By: self Schedule Follow-up Appointment: Yes Clinical Summary of Care: Patient Declined Electronic Signature(s) Signed: 07/23/2022 10:11:07 AM By: Carlene Coria RN Barry Dienes, Joesph Fillers ([id number]) [id number] Page 4 of 10 Signed: 07/23/2022 10:11:07 AM By: Carlene Coria RN Entered By: Carlene Coria on 07/23/2022 10:11:07 -------------------------------------------------------------------------------- Lower Extremity Assessment Details Patient Name: Date of Service: KEYTON, BHAT 07/23/2022 8:45 A M Medical Record Number: [id number] Patient Account Number: [id number] Date of Birth/Sex: Treating RN: 1976-06-09 (46 y.o. Timothy Powell) Carlene Coria Primary Care Tran Arzuaga: PA Haig [place], [place] Other Clinician: Referring Nyan Dufresne: Treating Suvi Archuletta/Extender: Ramiro Harvest in Treatment: 0 Edema Assessment Assessed: [Left: No] [Right: No] Edema: [Left: N] [Right: o] Calf Left: Right: Point of Measurement: 36 cm From Medial Instep 31 cm Ankle Left: Right: Point of Measurement: 12 cm From Medial Instep 21 cm Knee To Floor Left: Right: From Medial Instep 48 cm Vascular Assessment Pulses: Dorsalis Pedis Palpable: [Right:Yes] Doppler Audible: [Right:Yes] Blood Pressure: Brachial: [Right:191] Ankle: [Right:Dorsalis Pedis: 180 0.94] Electronic Signature(s) Signed: 07/23/2022 4:14:17 PM By: Carlene Coria RN Entered By: Carlene Coria on 07/23/2022 09:29:03 Multi Wound Chart  Details -------------------------------------------------------------------------------- [name] ([id number]) [id number] Page 5 of 10 Patient Name: Date of Service: COLE, EASTRIDGE 07/23/2022 8:45 A M Medical Record Number: [id number] Patient Account Number: [id number] Date of Birth/Sex: Treating RN: 04-Dec-1975 (46 y.o. Timothy Powell) Carlene Coria Primary Care Tyaira Heward: PA Haig Prophet, [place] Other Clinician: Referring Aiko Belko: Treating Chayanne Filippi/Extender: Ramiro Harvest in Treatment: 0 Vital Signs Height(in): 70 Pulse(bpm): 4 Weight(lbs): 200 Blood Pressure(mmHg): 191/105 Body Mass Index(BMI): 28.7 Temperature(F): 98.2 Respiratory Rate(breaths/min): 18 Wound Assessments Wound Number: 1 N/A N/A Photos: N/A N/A Right, Lateral Ankle  N/A N/A Wound Location: Gradually Appeared N/A N/A Wounding Event: Atypical N/A N/A Primary Etiology: Hypertension N/A N/A Comorbid History: 04/29/2022 N/A N/A Date Acquired: 0 N/A N/A Weeks of Treatment: Open N/A N/A Wound Status: No N/A N/A Wound Recurrence: 3x2x0.1 N/A N/A Measurements L x W x D (cm) 4.712 N/A N/A A (cm) : rea 0.471 N/A N/A Volume (cm) : Full Thickness Without Exposed N/A N/A Classification: Support Structures Medium N/A N/A Exudate A mount: Serosanguineous N/A N/A Exudate Type: red, brown N/A N/A Exudate Color: None Present (0%) N/A N/A Granulation A mount: Large (67-100%) N/A N/A Necrotic A mount: Fascia: No N/A N/A Exposed Structures: Fat Layer (Subcutaneous Tissue): No Tendon: No Muscle: No Joint: No Bone: No None N/A N/A Epithelialization: Chemical/Enzymatic/Mechanical N/A N/A Debridement: Pre-procedure Verification/Time Out 09:48 N/A N/A Taken: Other(saline gauze) N/A N/A Instrument: Minimum N/A N/A Bleeding: Pressure N/A N/A Hemostasis Achieved: 0 N/A N/A Procedural Pain: 0 N/A N/A Post Procedural Pain: Procedure was tolerated well N/A  N/A Debridement Treatment Response: 3x2x0.1 N/A N/A Post Debridement Measurements L x W x D (cm) 0.471 N/A N/A Post Debridement Volume: (cm) Debridement N/A N/A Procedures Performed: Treatment Notes Electronic Signature(s) Signed: 07/23/2022 11:07:53 AM By: Kalman Shan DO Entered By: Kalman Shan on 07/23/2022 09:59:06 [name] ([id number]) [id number] Page 6 of 10 -------------------------------------------------------------------------------- Multi-Disciplinary Care Plan Details Patient Name: Date of Service: JAMARR, TREINEN 07/23/2022 8:45 A M Medical Record Number: [id number] Patient Account Number: [id number] Date of Birth/Sex: Treating RN: Jan 25, 1976 (46 y.o. Timothy Powell) Carlene Coria Primary Care Nonna Renninger: PA Haig [place], [place] Other Clinician: Referring Fredric Slabach: Treating Mariaelena Cade/Extender: Ramiro Harvest in Treatment: 0 Active Inactive Necrotic Tissue Nursing Diagnoses: Knowledge deficit related to management of necrotic/devitalized tissue Goals: Necrotic/devitalized tissue will be minimized in the wound bed Date Initiated: 07/23/2022 Target Resolution Date: 08/23/2022 Goal Status: Active Patient/caregiver will verbalize understanding of reason and process for debridement of necrotic tissue Date Initiated: 07/23/2022 Target Resolution Date: 08/23/2022 Goal Status: Active Interventions: Assess patient pain level pre-, during and post procedure and prior to discharge Provide education on necrotic tissue and debridement process Notes: Wound/Skin Impairment Nursing Diagnoses: Knowledge deficit related to ulceration/compromised skin integrity Goals: Patient/caregiver will verbalize understanding of skin care regimen Date Initiated: 07/23/2022 Target Resolution Date: 08/23/2022 Goal Status: Active Ulcer/skin breakdown will have a volume reduction of 30% by week 4 Date Initiated: 07/23/2022 Target Resolution Date:  08/23/2022 Goal Status: Active Ulcer/skin breakdown will have a volume reduction of 50% by week 8 Date Initiated: 07/23/2022 Target Resolution Date: 09/23/2022 Goal Status: Active Ulcer/skin breakdown will have a volume reduction of 80% by week 12 Date Initiated: 07/23/2022 Target Resolution Date: 10/22/2022 Goal Status: Active Ulcer/skin breakdown will heal within 14 weeks Date Initiated: 07/23/2022 Target Resolution Date: 11/22/2022 Goal Status: Active Interventions: Assess patient/caregiver ability to obtain necessary supplies Assess patient/caregiver ability to perform ulcer/skin care regimen upon admission and as needed Assess ulceration(s) every visit Notes: Electronic Signature(s) Signed: 07/23/2022 10:09:50 AM By: Carlene Coria RN Entered By: Carlene Coria on 07/23/2022 10:09:50 [name] ([id number]) [id number] Page 7 of 10 -------------------------------------------------------------------------------- Pain Assessment Details Patient Name: Date of Service: JOSH, NICOLOSI 07/23/2022 8:45 A M Medical Record Number: [id number] Patient Account Number: [id number] Date of Birth/Sex: Treating RN: 14-Apr-1976 (46 y.o. Timothy Powell) Carlene Coria Primary Care Grover Robinson: PA Haig [place], [place] Other Clinician: Referring Deasia Chiu: Treating Shelena Castelluccio/Extender: Ramiro Harvest in Treatment: 0 Active Problems Location of Pain Severity and Description of Pain Patient Has Paino  No Site Locations Pain Management and Medication Current Pain Management: Electronic Signature(s) Signed: 07/23/2022 9:38:27 AM By: Rosalio Loud MSN RN CNS WTA Signed: 07/23/2022 4:14:17 PM By: Carlene Coria RN Entered By: Rosalio Loud on 07/23/2022 09:38:27 -------------------------------------------------------------------------------- Patient/Caregiver Education Details Patient Name: Date of Service: Lowell Bouton 12/20/2023andnbsp8:45 A M Medical Record Number:  [id number] Patient Account Number: [id number] Date of Birth/Gender: Treating RN: 09/02/1975 (46 y.o. [name] Primary Care Physician: PA Darnelle Spangle Other ClinicianMAKS, CAVALLERO ([id number]) [id number] Page 8 of 10 Referring Physician: Treating Physician/Extender: Ramiro Harvest in Treatment: 0 Education Assessment Education Provided To: Patient Education Topics Provided Wound/Skin Impairment: Methods: Explain/Verbal Responses: State content correctly Electronic Signature(s) Signed: 07/23/2022 4:14:17 PM By: Carlene Coria RN Entered By: Carlene Coria on 07/23/2022 10:08:28 -------------------------------------------------------------------------------- Wound Assessment Details Patient Name: Date of Service: Timothy Roup R. 07/23/2022 8:45 A M Medical Record Number: [id number] Patient Account Number: [id number] Date of Birth/Sex: Treating RN: 09-14-1975 (46 y.o. Timothy Powell) Carlene Coria Primary Care September Mormile: PA Haig [place], [place] Other Clinician: Referring Sybella Harnish: Treating Katryna Tschirhart/Extender: Ramiro Harvest in Treatment: 0 Wound Status Wound Number: 1 Primary Etiology: Trauma, Other Wound Location: Right, Lateral Ankle Wound Status: Open Wounding Event: Gradually Appeared Comorbid History: Hypertension Date Acquired: 04/29/2022 Weeks Of Treatment: 0 Clustered Wound: No Photos Wound Measurements Length: (cm) 3 Width: (cm) 2 Depth: (cm) 0.1 Area: (cm) 4.712 Volume: (cm) 0.471 % Reduction in Area: % Reduction in Volume: Epithelialization: None Tunneling: No Undermining: No Wound Description Classification: Full Thickness Without Exposed Support Structures VARUN, JOURDAN R ([id number]) Exudate Amount: Medium Exudate Type: Serosanguineous Exudate Color: red, brown Foul Odor After Cleansing: No [id number] Page 9 of 10 Slough/Fibrino Yes Wound Bed Granulation Amount: None Present  (0%) Exposed Structure Necrotic Amount: Large (67-100%) Fascia Exposed: No Necrotic Quality: Adherent Slough Fat Layer (Subcutaneous Tissue) Exposed: No Tendon Exposed: No Muscle Exposed: No Joint Exposed: No Bone Exposed: No Treatment Notes Wound #1 (Ankle) Wound Laterality: Right, Lateral Cleanser [place] Ancillary Kit - 15 Day Supply Discharge Instruction: Use supplies as instructed; Kit contains: (15) Saline Bullets; (15) 3x3 Gauze; 15 pr Gloves Peri-Wound Care Topical Primary Dressing Honey: Activon Honey Gel, 25 (g) Tube Hydrofera Blue Ready Transfer Foam, 2.5x2.5 (in/in) Discharge Instruction: Apply Hydrofera Blue Ready to wound bed as directed Secondary Dressing (BORDER) Zetuvit Plus SILICONE BORDER Dressing 5x5 (in/in) Discharge Instruction: Please do not put silicone bordered dressings under wraps. Use non-bordered dressing only. Secured With Tubigrip Size C, 2.75x10 (in/yd) Discharge Instruction: double Apply 3 Tubigrip C 3-finger-widths below knee to base of toes to secure dressing and/or for swelling. Compression Wrap Compression Stockings Add-Ons Electronic Signature(s) Signed: 07/23/2022 10:11:56 AM By: Carlene Coria RN Entered By: Carlene Coria on 07/23/2022 10:11:56 -------------------------------------------------------------------------------- Vitals Details Patient Name: Date of Service: Timothy Roup R. 07/23/2022 8:45 A M Medical Record Number: [id number] Patient Account Number: [id number] Date of Birth/Sex: Treating RN: 1975-10-16 (46 y.o. Timothy Powell) Carlene Coria Primary Care Albertia Carvin: PA Haig [place], [place] Other Clinician: Referring Shoshana Johal: Treating Maxmilian Trostel/Extender: Ramiro Harvest in Treatment: 0 Vital Signs Time Taken: 09:10 Temperature (F): 98.2 Height (in): 70 Pulse (bpm): 76 Source: Stated Respiratory Rate (breaths/min): 18 Rudy, Brighton R ([id number]) [id number] Page 10 of 10 Weight (lbs): 200 Blood  Pressure (mmHg): 191/105 Source: Stated Reference Range: 80 - 120 mg / dl Body Mass Index (BMI): 28.7 Electronic Signature(s) Signed: 07/23/2022 9:38:45 AM By: Rosalio Loud MSN RN CNS WTA Entered By: Rosalio Loud on 07/23/2022  09:38:45

## 2022-07-23 NOTE — Progress Notes (Signed)
MARQUIZ, SOTELO ([id number]) [phone] [id number] Page 1 of 5 Visit Report for 07/23/2022 Abuse Risk Screen Details Patient Name: Date of Service: Timothy Powell, Timothy Powell 07/23/2022 8:45 A M Medical Record Number: [id number] Patient Account Number: [id number] Date of Birth/Sex: Treating RN: 18-Sep-1975 (46 y.o. [name]) Carlene Coria Primary Care Iker Nuttall: [place] Haig Prophet, [place] Other Clinician: Referring Maurianna Benard: Treating Haifa Hatton/Extender: Ramiro Harvest in Treatment: 0 Abuse Risk Screen Items Answer ABUSE RISK SCREEN: Has anyone close to you tried to hurt or harm you recentlyo No Do you feel uncomfortable with anyone in your familyo No Has anyone forced you do things that you didnt want to doo No Electronic Signature(s) Signed: 07/23/2022 4:14:17 PM By: Carlene Coria RN Entered By: Carlene Coria on 07/23/2022 09:12:21 -------------------------------------------------------------------------------- Activities of Daily Living Details Patient Name: Date of Service: Timothy Powell, Timothy Powell 07/23/2022 8:45 A M Medical Record Number: [id number] Patient Account Number: [id number] Date of Birth/Sex: Treating RN: 02/07/1976 (46 y.o. [name]) Carlene Coria Primary Care Deetra Booton: PA Haig Prophet, NO Other Clinician: Referring Viktor Philipp: Treating Zema Lizardo/Extender: Ramiro Harvest in Treatment: 0 Activities of Daily Living Items Answer Activities of Daily Living (Please select one for each item) Drive Automobile Completely Able T Medications ake Completely Able Use T elephone Completely Able Care for Appearance Completely Able Use T oilet Completely Able Bath / Shower Completely Able Dress Self Completely Able Feed Self Completely Able Walk Completely Able Get In / Out Bed Completely Able Housework Completely Timothy Powell, Timothy Powell ([id number]) [id number] Page 2 of 5 Prepare Meals Completely Able Handle Money Completely  Able Shop for Self Completely Able Electronic Signature(s) Signed: 07/23/2022 4:14:17 PM By: Carlene Coria RN Entered By: Carlene Coria on 07/23/2022 09:12:41 -------------------------------------------------------------------------------- Education Screening Details Patient Name: Date of Service: Timothy Roup Powell. 07/23/2022 8:45 A M Medical Record Number: [id number] Patient Account Number: [id number] Date of Birth/Sex: Treating RN: 05/28/1976 (46 y.o. [name]) Carlene Coria Primary Care Nate Common: PA Haig Prophet, NO Other Clinician: Referring Curby Carswell: Treating Bianey Tesoro/Extender: Ramiro Harvest in Treatment: 0 Primary Learner Assessed: Patient Learning Preferences/Education Level/Primary Language Learning Preference: Explanation Highest Education Level: College or Above Preferred Language: English Cognitive Barrier Language Barrier: No Translator Needed: No Memory Deficit: No Emotional Barrier: No Cultural/Religious Beliefs Affecting Medical Care: No Physical Barrier Impaired Vision: No Impaired Hearing: No Decreased Hand dexterity: No Knowledge/Comprehension Knowledge Level: Medium Comprehension Level: High Ability to understand written instructions: High Ability to understand verbal instructions: High Motivation Anxiety Level: Anxious Cooperation: Cooperative Education Importance: Acknowledges Need Interest in Health Problems: Asks Questions Perception: Coherent Willingness to Engage in Self-Management High Activities: Readiness to Engage in Self-Management High Activities: Electronic Signature(s) Signed: 07/23/2022 4:14:17 PM By: Carlene Coria RN Entered By: Carlene Coria on 07/23/2022 09:13:06 Timothy Powell ([id number]) [id number] Page 3 of 5 -------------------------------------------------------------------------------- Fall Risk Assessment Details Patient Name: Date of Service: Timothy Powell, Timothy Powell 07/23/2022 8:45 A  M Medical Record Number: [id number] Patient Account Number: [id number] Date of Birth/Sex: Treating RN: 1976/07/11 (46 y.o. [name]) Carlene Coria Primary Care Erek Kowal: PA Haig Prophet, [place] Other Clinician: Referring Kynzli Rease: Treating Koraline Phillipson/Extender: Ramiro Harvest in Treatment: 0 Fall Risk Assessment Items Have you had 2 or more falls in the last 12 monthso 0 No Have you had any fall that resulted in injury in the last 12 monthso 0 No FALLS RISK SCREEN History of falling - immediate or within 3 months 0 No Secondary diagnosis (Do you have 2 or more  medical diagnoseso) 0 No Ambulatory aid None/bed rest/wheelchair/nurse 0 No Crutches/cane/walker 0 No Furniture 0 No Intravenous therapy Access/Saline/Heparin Lock 0 No Gait/Transferring Normal/ bed rest/ wheelchair 0 No Weak (short steps with or without shuffle, stooped but able to lift head while walking, may seek 0 No support from furniture) Impaired (short steps with shuffle, may have difficulty arising from chair, head down, impaired 0 No balance) Mental Status Oriented to own ability 0 No Electronic Signature(s) Signed: 07/23/2022 4:14:17 PM By: Carlene Coria RN Entered By: Carlene Coria on 07/23/2022 09:13:56 -------------------------------------------------------------------------------- Foot Assessment Details Patient Name: Date of Service: Timothy Roup Powell. 07/23/2022 8:45 A M Medical Record Number: [id number] Patient Account Number: [id number] Date of Birth/Sex: Treating RN: 10/27/1975 (46 y.o. [name]) Carlene Coria Primary Care Elliemae Braman: PA Haig Prophet, NO Other Clinician: Referring Angelyna Henderson: Treating Sherena Machorro/Extender: Ramiro Harvest in Treatment: 0 Foot Assessment Items Site Locations Timothy Powell, Timothy Powell ([id number]) [phone] [id number] Page 4 of 5 + = Sensation present, - = Sensation absent, C = Callus, U = Ulcer Powell = Redness, W = Warmth, M = Maceration, PU = Pre-ulcerative  lesion F = Fissure, S = Swelling, D = Dryness Assessment Right: Left: Other Deformity: No No Prior Foot Ulcer: No No Prior Amputation: No No Charcot Joint: No No Ambulatory Status: Ambulatory Without Help Gait: Steady Electronic Signature(s) Signed: 07/23/2022 4:14:17 PM By: Carlene Coria RN Entered By: Carlene Coria on 07/23/2022 09:28:04 -------------------------------------------------------------------------------- Nutrition Risk Screening Details Patient Name: Date of Service: Timothy Powell, Timothy Powell 07/23/2022 8:45 A M Medical Record Number: [id number] Patient Account Number: [id number] Date of Birth/Sex: Treating RN: February 14, 1976 (46 y.o. [name]) Carlene Coria Primary Care Terrall Bley: PA Haig Prophet, NO Other Clinician: Referring Timothy Powell: Treating Elridge Stemm/Extender: Ramiro Harvest in Treatment: 0 Height (in): 70 Weight (lbs): 200 Body Mass Index (BMI): 28.7 Nutrition Risk Screening Items Score Screening NUTRITION RISK SCREEN: I have an illness or condition that made me change the kind and/or amount of food I eat 0 No I eat fewer than two meals per day 0 No I eat few fruits and vegetables, or milk products 0 No I have three or more drinks of beer, liquor or wine almost every day 0 No I have tooth or mouth problems that make it hard for me to eat 0 No I don't always have enough money to buy the food I need 0 No [place], Decklan Powell ([id number]) [id number] Page 5 of 5 I eat alone most of the time 0 No I take three or more different prescribed or over-the-counter drugs a day 1 Yes Without wanting to, I have lost or gained 10 pounds in the last six months 0 No I am not always physically able to shop, cook and/or feed myself 0 No Nutrition Protocols Good Risk Protocol 0 No interventions needed Moderate Risk Protocol High Risk Proctocol Risk Level: Good Risk Score: 1 Electronic Signature(s) Signed: 07/23/2022 4:14:17 PM By: Carlene Coria  RN Entered By: Carlene Coria on 07/23/2022 09:14:09

## 2022-07-30 ENCOUNTER — Other Ambulatory Visit
Admission: RE | Admit: 2022-07-30 | Discharge: 2022-07-30 | Disposition: A | Discharge status: Home / Self Care | Payer: Medicare Other | Source: Ambulatory Visit | Attending: Internal Medicine | Admitting: Internal Medicine

## 2022-07-30 ENCOUNTER — Encounter: Payer: Medicare Other | Admitting: Internal Medicine

## 2022-07-30 DIAGNOSIS — L97319 Non-pressure chronic ulcer of right ankle with unspecified severity: Secondary | ICD-10-CM | POA: Insufficient documentation

## 2022-07-30 DIAGNOSIS — B958 Unspecified staphylococcus as the cause of diseases classified elsewhere: Secondary | ICD-10-CM | POA: Diagnosis not present

## 2022-07-30 DIAGNOSIS — M25571 Pain in right ankle and joints of right foot: Secondary | ICD-10-CM | POA: Diagnosis not present

## 2022-07-30 DIAGNOSIS — S93401A Sprain of unspecified ligament of right ankle, initial encounter: Secondary | ICD-10-CM | POA: Diagnosis not present

## 2022-07-30 DIAGNOSIS — B952 Enterococcus as the cause of diseases classified elsewhere: Secondary | ICD-10-CM | POA: Insufficient documentation

## 2022-07-30 DIAGNOSIS — L97318 Non-pressure chronic ulcer of right ankle with other specified severity: Secondary | ICD-10-CM | POA: Diagnosis not present

## 2022-07-30 DIAGNOSIS — L03115 Cellulitis of right lower limb: Secondary | ICD-10-CM | POA: Diagnosis not present

## 2022-07-30 DIAGNOSIS — L97812 Non-pressure chronic ulcer of other part of right lower leg with fat layer exposed: Secondary | ICD-10-CM | POA: Diagnosis not present

## 2022-07-30 DIAGNOSIS — I10 Essential (primary) hypertension: Secondary | ICD-10-CM | POA: Diagnosis not present

## 2022-07-30 DIAGNOSIS — S91001A Unspecified open wound, right ankle, initial encounter: Secondary | ICD-10-CM | POA: Diagnosis not present

## 2022-07-30 DIAGNOSIS — M13871 Other specified arthritis, right ankle and foot: Secondary | ICD-10-CM | POA: Diagnosis not present

## 2022-07-30 DIAGNOSIS — M5416 Radiculopathy, lumbar region: Secondary | ICD-10-CM | POA: Diagnosis not present
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
## 2022-07-31 NOTE — Progress Notes (Signed)
Timothy, Powell ([id number]) [id number] Page 1 of 9 Visit Report for 07/30/2022 Arrival Information Details Patient Name: Date of Service: Timothy Powell, Timothy Powell 07/30/2022 11:00 A M Medical Record Number: [id number] Patient Account Number: [id number] Date of Birth/Sex: Treating RN: Feb 09, 1976 (46 y.o. Timothy Powell) Carlene Coria Primary Care Nolawi Kanady: PA Haig Prophet, NO Other Clinician: Referring Jerusha Reising: Treating Taysia Rivere/Extender: RO BSO N, Heeney EL Eppie Gibson in Treatment: 1 Visit Information History Since Last Visit Added or deleted any medications: No Patient Arrived: Ambulatory Any new allergies or adverse reactions: No Arrival Time: 11:16 Had a fall or experienced change in No Accompanied By: self activities of daily living that may affect Transfer Assistance: None risk of falls: Patient Identification Verified: Yes Signs or symptoms of abuse/neglect since last visito No Secondary Verification Process Completed: Yes Hospitalized since last visit: No Patient Requires Transmission-Based Precautions: No Implantable device outside of the clinic excluding No Patient Has Alerts: No cellular tissue based products placed in the center since last visit: Has Dressing in Place as Prescribed: Yes Has Compression in Place as Prescribed: Yes Pain Present Now: No Electronic Signature(s) Signed: 07/31/2022 9:37:21 AM By: Carlene Coria RN Entered By: Carlene Coria on 07/30/2022 11:17:16 -------------------------------------------------------------------------------- Clinic Level of Care Assessment Details Patient Name: Date of Service: Timothy, Powell 07/30/2022 11:00 A M Medical Record Number: [id number] Patient Account Number: [id number] Date of Birth/Sex: Treating RN: Jun 29, 1976 (46 y.o. Timothy Powell) Carlene Coria Primary Care Lennox Dolberry: PA Haig Prophet, NO Other Clinician: Referring Cayton Cuevas: Treating Macaila Tahir/Extender: RO BSO N, [place] EL Eppie Gibson in Treatment:  1 Clinic Level of Care Assessment Items TOOL 4 Quantity Score X- 1 0 Use when only an EandM is performed on FOLLOW-UP visit ASSESSMENTS - Nursing Assessment / Reassessment X- 1 10 Reassessment of Co-morbidities (includes updates in patient status) X- 1 5 Reassessment of Adherence to Treatment Plan NIKOLOZ, HUY R ([id number]) [id number] Page 2 of 9 ASSESSMENTS - Wound and Skin A ssessment / Reassessment X - Simple Wound Assessment / Reassessment - one wound 1 5 _0  - 0 Complex Wound Assessment / Reassessment - multiple wounds _1  - 0 Dermatologic / Skin Assessment (not related to wound area) ASSESSMENTS - Focused Assessment _2  - 0 Circumferential Edema Measurements - multi extremities _3  - 0 Nutritional Assessment / Counseling / Intervention _4  - 0 Lower Extremity Assessment (monofilament, tuning fork, pulses) _5  - 0 Peripheral Arterial Disease Assessment (using hand held doppler) ASSESSMENTS - Ostomy and/or Continence Assessment and Care _6  - 0 Incontinence Assessment and Management _7  - 0 Ostomy Care Assessment and Management (repouching, etc.) PROCESS - Coordination of Care X - Simple Patient / Family Education for ongoing care 1 15 _8  - 0 Complex (extensive) Patient / Family Education for ongoing care _9  - 0 Staff obtains Programmer, systems, Records, T Results / Process Orders est _10  - 0 Staff telephones HHA, Nursing Homes / Clarify orders / etc _11  - 0 Routine Transfer to another Facility (non-emergent condition) _12  - 0 Routine Hospital Admission (non-emergent condition) _13  - 0 New Admissions / Biomedical engineer / Ordering NPWT Apligraf, etc. , _14  - 0 Emergency Hospital Admission (emergent condition) X- 1 10 Simple Discharge Coordination _15  - 0 Complex (extensive) Discharge Coordination PROCESS - Special Needs _16  - 0 Pediatric / Minor Patient Management _17  - 0 Isolation Patient Management _18  - 0 Hearing / Language / Visual special  needs _19  - 0 Assessment of Community assistance (transportation, D/C planning, etc.) _20  - 0 Additional assistance / Altered mentation _21  -  0 Support Surface(s) Assessment (bed, cushion, seat, etc.) INTERVENTIONS - Wound Cleansing / Measurement X - Simple Wound Cleansing - one wound 1 5 _0  - 0 Complex Wound Cleansing - multiple wounds X- 1 5 Wound Imaging (photographs - any number of wounds) _1  - 0 Wound Tracing (instead of photographs) X- 1 5 Simple Wound Measurement - one wound _2  - 0 Complex Wound Measurement - multiple wounds INTERVENTIONS - Wound Dressings X - Small Wound Dressing one or multiple wounds 1 10 _3  - 0 Medium Wound Dressing one or multiple wounds _4  - 0 Large Wound Dressing one or multiple wounds <OIZTIWPYKDXIPJAS>_5<\/KNLZJQBHALPFXTKW>_4  - 0 Application of Medications - topical <OXBDZHGDJMEQASTM>_1<\/DQQIWLNLGXQJJHER>_7  - 0 Application of Medications - injection INTERVENTIONS - Miscellaneous _7  - 0 External ear exam BINYOMIN, BRANN R ([id number]) [id number] Page 3 of 9 _8  - 0 Specimen Collection (cultures, biopsies, blood, body fluids, etc.) _9  - 0 Specimen(s) / Culture(s) sent or taken to Lab for analysis _10  - 0 Patient Transfer (multiple staff / Harrel Lemon Lift / Similar devices) _11  - 0 Simple Staple / Suture removal (25 or less) _12  - 0 Complex Staple / Suture removal (26 or more) _13  - 0 Hypo / Hyperglycemic Management (close monitor of Blood Glucose) _14  - 0 Ankle / Brachial Index (ABI) - do not check if billed separately X- 1 5 Vital Signs Has the patient been seen at the hospital within the last three years: Yes Total Score: 75 Level Of Care: New/Established - Level 2 Electronic Signature(s) Signed: 07/31/2022 9:37:21 AM By: Carlene Coria RN Entered By: Carlene Coria on 07/30/2022 11:48:44 -------------------------------------------------------------------------------- Encounter Discharge Information Details Patient Name: Date of Service: [name] 07/30/2022 11:00 A M Medical Record  Number: [id number] Patient Account Number: [id number] Date of Birth/Sex: Treating RN: 06-15-1976 (46 y.o. Timothy Powell) Carlene Coria Primary Care Kida Digiulio: PA Haig Prophet, NO Other Clinician: Referring Noraa Pickeral: Treating Olivine Hiers/Extender: RO BSO N, MICHA EL Eppie Gibson in Treatment: 1 Encounter Discharge Information Items Discharge Condition: Stable Ambulatory Status: Ambulatory Discharge Destination: Home Transportation: Private Auto Accompanied By: self Schedule Follow-up Appointment: Yes Clinical Summary of Care: Electronic Signature(s) Signed: 07/30/2022 11:50:07 AM By: Carlene Coria RN Entered By: Carlene Coria on 07/30/2022 11:50:07 Lower Extremity Assessment Details -------------------------------------------------------------------------------- [name] ([id number]) [id number] Page 4 of 9 Patient Name: Date of Service: GIANNIS, CORPUZ 07/30/2022 11:00 A M Medical Record Number: [id number] Patient Account Number: [id number] Date of Birth/Sex: Treating RN: [birth year] (46 y.o. Timothy Powell) Carlene Coria Primary Care Yatziry Deakins: PA Haig Prophet, NO Other Clinician: Referring Yacine Droz: Treating Maxine Fredman/Extender: RO BSO N, MICHA EL Eppie Gibson in Treatment: 1 Edema Assessment Left: Right: Assessed: No No Edema: Calf Left: Right: Point of Measurement: 36 cm From Medial Instep 31 cm Ankle Left: Right: Point of Measurement: 12 cm From Medial Instep 21 cm Electronic Signature(s) Signed: 07/31/2022 9:37:21 AM By: Carlene Coria RN Entered By: Carlene Coria on 07/30/2022 11:21:14 -------------------------------------------------------------------------------- Multi Wound Chart Details Patient Name: Date of Service: [name] 07/30/2022 11:00 A M Medical Record Number: [id number] Patient Account Number: [id number] Date of Birth/Sex: Treating RN: January 23, 1976 (46 y.o. Timothy Powell) Carlene Coria Primary Care Pate Aylward: PA Haig Prophet, NO Other Clinician: Referring  Gwenn Teodoro: Treating Burton Gahan/Extender: RO BSO N, MICHA EL Eppie Gibson in Treatment: 1 Vital Signs Height(in): 70 Pulse(bpm): 81 Weight(lbs): 200 Blood Pressure(mmHg): 171/114 Body Mass Index(BMI): 28.7 Temperature(F): 97.8 Respiratory Rate(breaths/min): 18 [1:Photos:] [N/A:N/A] Right, Lateral Ankle N/A N/A Wound Location: Gradually Appeared N/A N/A Wounding Event: Trauma, Other  N/A N/A Primary Etiology: Hypertension N/A N/A Comorbid History: 04/29/2022 N/A N/A Date Acquired: 1 N/A N/A Weeks of Treatment: Open N/A N/A Wound Status: No N/A N/A Wound Recurrence: 4x2.5x0.1 N/A N/A Measurements L x W x D (cm) YAFET, CLINE R ([id number]) [id number] Page 5 of 9 7.854 N/A N/A A (cm) : rea 0.785 N/A N/A Volume (cm) : -66.70% N/A N/A % Reduction in Area: -66.70% N/A N/A % Reduction in Volume: Full Thickness Without Exposed N/A N/A Classification: Support Structures Medium N/A N/A Exudate Amount: Serosanguineous N/A N/A Exudate Type: red, brown N/A N/A Exudate Color: None Present (0%) N/A N/A Granulation Amount: Large (67-100%) N/A N/A Necrotic Amount: Fascia: No N/A N/A Exposed Structures: Fat Layer (Subcutaneous Tissue): No Tendon: No Muscle: No Joint: No Bone: No None N/A N/A Epithelialization: Treatment Notes Electronic Signature(s) Signed: 07/30/2022 11:47:21 AM By: Carlene Coria RN Entered By: Carlene Coria on 07/30/2022 11:47:21 -------------------------------------------------------------------------------- [place] Details Patient Name: Date of Service: [name] 07/30/2022 11:00 A M Medical Record Number: [id number] Patient Account Number: [id number] Date of Birth/Sex: Treating RN: 1976/01/23 (46 y.o. Timothy Powell) Carlene Coria Primary Care Ivery Nanney: PA Haig Prophet, NO Other Clinician: Referring Jaysean Manville: Treating Sweet Jarvis/Extender: RO BSO N, MICHA EL Eppie Gibson in Treatment:  1 Active Inactive Necrotic Tissue Nursing Diagnoses: Knowledge deficit related to management of necrotic/devitalized tissue Goals: Necrotic/devitalized tissue will be minimized in the wound bed Date Initiated: 07/23/2022 Target Resolution Date: 08/23/2022 Goal Status: Active Patient/caregiver will verbalize understanding of reason and process for debridement of necrotic tissue Date Initiated: 07/23/2022 Target Resolution Date: 08/23/2022 Goal Status: Active Interventions: Assess patient pain level pre-, during and post procedure and prior to discharge Provide education on necrotic tissue and debridement process Notes: Wound/Skin Impairment Nursing Diagnoses: Knowledge deficit related to ulceration/compromised skin integrity GoalsNOCHUM, FENTER R ([id number]) [id number] Page 6 of 9 Patient/caregiver will verbalize understanding of skin care regimen Date Initiated: 07/23/2022 Target Resolution Date: 08/23/2022 Goal Status: Active Ulcer/skin breakdown will have a volume reduction of 30% by week 4 Date Initiated: 07/23/2022 Target Resolution Date: 08/23/2022 Goal Status: Active Ulcer/skin breakdown will have a volume reduction of 50% by week 8 Date Initiated: 07/23/2022 Target Resolution Date: 09/23/2022 Goal Status: Active Ulcer/skin breakdown will have a volume reduction of 80% by week 12 Date Initiated: 07/23/2022 Target Resolution Date: 10/22/2022 Goal Status: Active Ulcer/skin breakdown will heal within 14 weeks Date Initiated: 07/23/2022 Target Resolution Date: 11/22/2022 Goal Status: Active Interventions: Assess patient/caregiver ability to obtain necessary supplies Assess patient/caregiver ability to perform ulcer/skin care regimen upon admission and as needed Assess ulceration(s) every visit Notes: Electronic Signature(s) Signed: 07/30/2022 11:49:07 AM By: Carlene Coria RN Entered By: Carlene Coria on 07/30/2022  11:49:07 -------------------------------------------------------------------------------- Pain Assessment Details Patient Name: Date of Service: TRAYVEON, BECKFORD R. 07/30/2022 11:00 A M Medical Record Number: [id number] Patient Account Number: [id number] Date of Birth/Sex: Treating RN: 06-22-1976 (46 y.o. Timothy Powell) Carlene Coria Primary Care Zebedee Segundo: PA Haig Prophet, NO Other Clinician: Referring Abdiaziz Klahn: Treating Yancy Knoble/Extender: RO BSO N, MICHA EL Eppie Gibson in Treatment: 1 Active Problems Location of Pain Severity and Description of Pain Patient Has Paino No Site Locations Pain Management and Medication Current Pain ManagementCHAUN, UEMURA ([id number]) [id number] Page 7 of 9 Electronic Signature(s) Signed: 07/31/2022 9:37:21 AM By: Carlene Coria RN Entered By: Carlene Coria on 07/30/2022 11:17:46 -------------------------------------------------------------------------------- Patient/Caregiver Education Details Patient Name: Date of Service: Lowell Bouton 12/27/2023andnbsp11:00 A M Medical Record Number: [id number] Patient Account Number: [id number]  Date of Birth/Gender: Treating RN: [birth year] (46 y.o. Timothy Powell) Carlene Coria Primary Care Physician: PA Haig Prophet, NO Other Clinician: Referring Physician: Treating Physician/Extender: RO BSO N, MICHA EL Eppie Gibson in Treatment: 1 Education Assessment Education Provided To: Patient Education Topics Provided Wound/Skin Impairment: Methods: Explain/Verbal Responses: State content correctly Electronic Signature(s) Signed: 07/31/2022 9:37:21 AM By: Carlene Coria RN Entered By: Carlene Coria on 07/30/2022 11:49:00 -------------------------------------------------------------------------------- Wound Assessment Details Patient Name: Date of Service: GRIFFON, HERBERG R. 07/30/2022 11:00 A M Medical Record Number: [id number] Patient Account Number: [id number] Date of Birth/Sex: Treating RN: [birth year] (46  y.o. Timothy Powell) Carlene Coria Primary Care Christia Coaxum: PA Haig Prophet, NO Other Clinician: Referring Ayako Tapanes: Treating Valarie Farace/Extender: RO BSO N, MICHA EL Eppie Gibson in Treatment: 1 Wound Status Wound Number: 1 Primary Etiology: Trauma, Other Wound Location: Right, Lateral Ankle Wound Status: Open Wounding Event: Gradually Appeared Comorbid History: Hypertension Date Acquired: 04/29/2022 Weeks Of Treatment: 1 JARRIS, KORTZ R ([id number]) [id number] Page 8 of 9 Clustered Wound: No Photos Wound Measurements Length: (cm) 4 Width: (cm) 2.5 Depth: (cm) 0.1 Area: (cm) 7.854 Volume: (cm) 0.785 % Reduction in Area: -66.7% % Reduction in Volume: -66.7% Epithelialization: None Tunneling: No Undermining: No Wound Description Classification: Full Thickness Without Exposed Support Structures Exudate Amount: Medium Exudate Type: Serosanguineous Exudate Color: red, brown Foul Odor After Cleansing: No Slough/Fibrino Yes Wound Bed Granulation Amount: None Present (0%) Exposed Structure Necrotic Amount: Large (67-100%) Fascia Exposed: No Necrotic Quality: Adherent Slough Fat Layer (Subcutaneous Tissue) Exposed: No Tendon Exposed: No Muscle Exposed: No Joint Exposed: No Bone Exposed: No Treatment Notes Wound #1 (Ankle) Wound Laterality: Right, Lateral Cleanser [place] Ancillary Kit - 15 Day Supply Discharge Instruction: Use supplies as instructed; Kit contains: (15) Saline Bullets; (15) 3x3 Gauze; 15 pr Gloves Peri-Wound Care Topical Primary Dressing Honey: Activon Honey Gel, 25 (g) Tube Hydrofera Blue Ready Transfer Foam, 2.5x2.5 (in/in) Discharge Instruction: Apply Hydrofera Blue Ready to wound bed as directed Secondary Dressing (BORDER) Zetuvit Plus SILICONE BORDER Dressing 5x5 (in/in) Discharge Instruction: Please do not put silicone bordered dressings under wraps. Use non-bordered dressing only. Secured With Tubigrip Size C, 2.75x10  (in/yd) Discharge Instruction: double Apply 3 Tubigrip C 3-finger-widths below knee to base of toes to secure dressing and/or for swelling. Compression Wrap Compression Stockings Add-Ons Electronic Signature(s) Signed: 07/31/2022 9:37:21 AM By: Carlene Coria RN Entered By: Carlene Coria on 07/30/2022 11:20:54 [name] ([id number]) [id number] Page 9 of 9 -------------------------------------------------------------------------------- Vitals Details Patient Name: Date of Service: PACEN, WATFORD 07/30/2022 11:00 A M Medical Record Number: [id number] Patient Account Number: [id number] Date of Birth/Sex: Treating RN: January 27, 1976 (46 y.o. Timothy Powell) Carlene Coria Primary Care Daxten Kovalenko: PA TIENT, NO Other Clinician: Referring Scotland Korver: Treating Montgomery Favor/Extender: RO BSO N, MICHA EL Eppie Gibson in Treatment: 1 Vital Signs Time Taken: 11:17 Temperature (F): 97.8 Height (in): 70 Pulse (bpm): 81 Weight (lbs): 200 Respiratory Rate (breaths/min): 18 Body Mass Index (BMI): 28.7 Blood Pressure (mmHg): 171/114 Reference Range: 80 - 120 mg / dl Electronic Signature(s) Signed: 07/31/2022 9:37:21 AM By: Carlene Coria RN Entered By: Carlene Coria on 07/30/2022 11:17:40

## 2022-08-02 LAB — AEROBIC CULTURE W GRAM STAIN (SUPERFICIAL SPECIMEN): Gram Stain: NONE SEEN

## 2022-08-06 ENCOUNTER — Ambulatory Visit: Payer: Medicare Other | Admitting: Internal Medicine

## 2022-08-13 ENCOUNTER — Encounter: Payer: Medicare Other | Attending: Internal Medicine | Admitting: Internal Medicine

## 2022-08-13 DIAGNOSIS — L97812 Non-pressure chronic ulcer of other part of right lower leg with fat layer exposed: Secondary | ICD-10-CM | POA: Insufficient documentation

## 2022-08-13 DIAGNOSIS — M5412 Radiculopathy, cervical region: Secondary | ICD-10-CM | POA: Insufficient documentation

## 2022-08-13 DIAGNOSIS — M5416 Radiculopathy, lumbar region: Secondary | ICD-10-CM | POA: Insufficient documentation

## 2022-08-13 DIAGNOSIS — L03115 Cellulitis of right lower limb: Secondary | ICD-10-CM | POA: Insufficient documentation

## 2022-08-13 DIAGNOSIS — I10 Essential (primary) hypertension: Secondary | ICD-10-CM | POA: Diagnosis not present

## 2022-08-13 DIAGNOSIS — T798XXA Other early complications of trauma, initial encounter: Secondary | ICD-10-CM | POA: Diagnosis not present

## 2022-08-13 DIAGNOSIS — X58XXXA Exposure to other specified factors, initial encounter: Secondary | ICD-10-CM | POA: Diagnosis not present
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
## 2022-08-14 NOTE — Progress Notes (Signed)
Timothy Powell, Timothy Powell ([id number]) [id number] Page 1 of 9 Visit Report for 08/13/2022 Arrival Information Details Patient Name: Date of Service: Timothy Powell, Timothy Powell 08/13/2022 11:15 A M Medical Record Number: [id number] Patient Account Number: [id number] Date of Birth/Sex: Treating RN: 12/21/1975 (46 y.o. Timothy Powell) Carlene Coria Primary Care Timothy Powell: PA Timothy Powell, [place] Other Clinician: Referring Timothy Powell: Treating Timothy Powell/Extender: Timothy Powell in Treatment: 3 Visit Information History Since Last Visit Added or deleted any medications: No Patient Arrived: Ambulatory Any new allergies or adverse reactions: No Arrival Time: 11:24 Had a fall or experienced change in No Accompanied By: self activities of daily living that may affect Transfer Assistance: None risk of falls: Patient Identification Verified: Yes Signs or symptoms of abuse/neglect since last visito No Secondary Verification Process Completed: Yes Hospitalized since last visit: No Patient Requires Transmission-Based Precautions: No Implantable device outside of the clinic excluding No Patient Has Alerts: No cellular tissue based products placed in the center since last visit: Has Dressing in Place as Prescribed: Yes Pain Present Now: No Electronic Signature(s) Signed: 08/14/2022 3:13:07 PM By: Carlene Coria RN Entered By: Carlene Coria on 08/13/2022 11:25:39 -------------------------------------------------------------------------------- Clinic Level of Care Assessment Details Patient Name: Date of Service: Timothy Powell, Timothy Powell 08/13/2022 11:15 A M Medical Record Number: [id number] Patient Account Number: [id number] Date of Birth/Sex: Treating RN: June 10, 1976 (46 y.o. Timothy Powell) Carlene Coria Primary Care Timothy Powell: PA Timothy Powell, NO Other Clinician: Referring Timothy Powell: Treating Timothy Powell/Extender: Timothy Powell in Treatment: 3 Clinic Level of Care Assessment Items TOOL 4 Quantity  Score X- 1 0 Use when only an EandM is performed on FOLLOW-UP visit ASSESSMENTS - Nursing Assessment / Reassessment X- 1 10 Reassessment of Co-morbidities (includes updates in patient status) X- 1 5 Reassessment of Adherence to Treatment Plan Timothy Powell, Timothy Powell ([id number]) [id number] Page 2 of 9 ASSESSMENTS - Wound and Skin A ssessment / Reassessment X - Simple Wound Assessment / Reassessment - one wound 1 5 '[]'$  - 0 Complex Wound Assessment / Reassessment - multiple wounds '[]'$  - 0 Dermatologic / Skin Assessment (not related to wound area) ASSESSMENTS - Focused Assessment '[]'$  - 0 Circumferential Edema Measurements - multi extremities '[]'$  - 0 Nutritional Assessment / Counseling / Intervention '[]'$  - 0 Lower Extremity Assessment (monofilament, tuning fork, pulses) '[]'$  - 0 Peripheral Arterial Disease Assessment (using hand held doppler) ASSESSMENTS - Ostomy and/or Continence Assessment and Care '[]'$  - 0 Incontinence Assessment and Management '[]'$  - 0 Ostomy Care Assessment and Management (repouching, etc.) PROCESS - Coordination of Care X - Simple Patient / Family Education for ongoing care 1 15 '[]'$  - 0 Complex (extensive) Patient / Family Education for ongoing care X- 1 10 Staff obtains Programmer, systems, Records, T Results / Process Orders est '[]'$  - 0 Staff telephones HHA, Nursing Homes / Clarify orders / etc '[]'$  - 0 Routine Transfer to another Facility (non-emergent condition) '[]'$  - 0 Routine Hospital Admission (non-emergent condition) '[]'$  - 0 New Admissions / Biomedical engineer / Ordering NPWT Apligraf, etc. , '[]'$  - 0 Emergency Hospital Admission (emergent condition) X- 1 10 Simple Discharge Coordination '[]'$  - 0 Complex (extensive) Discharge Coordination PROCESS - Special Needs '[]'$  - 0 Pediatric / Minor Patient Management '[]'$  - 0 Isolation Patient Management '[]'$  - 0 Hearing / Language / Visual special needs '[]'$  - 0 Assessment of Community assistance  (transportation, D/C planning, etc.) '[]'$  - 0 Additional assistance / Altered mentation '[]'$  - 0 Support Surface(s) Assessment (bed, cushion, seat, etc.) INTERVENTIONS - Wound Cleansing / Measurement  X - Simple Wound Cleansing - one wound 1 5 '[]'$  - 0 Complex Wound Cleansing - multiple wounds X- 1 5 Wound Imaging (photographs - any number of wounds) '[]'$  - 0 Wound Tracing (instead of photographs) X- 1 5 Simple Wound Measurement - one wound '[]'$  - 0 Complex Wound Measurement - multiple wounds INTERVENTIONS - Wound Dressings X - Small Wound Dressing one or multiple wounds 1 10 '[]'$  - 0 Medium Wound Dressing one or multiple wounds '[]'$  - 0 Large Wound Dressing one or multiple wounds X- 1 5 Application of Medications - topical '[]'$  - 0 Application of Medications - injection INTERVENTIONS - Miscellaneous '[]'$  - 0 External ear exam Timothy Powell, Timothy Powell ([id number]) [id number] Page 3 of 9 '[]'$  - 0 Specimen Collection (cultures, biopsies, blood, body fluids, etc.) '[]'$  - 0 Specimen(s) / Culture(s) sent or taken to Lab for analysis '[]'$  - 0 Patient Transfer (multiple staff / Harrel Lemon Lift / Similar devices) '[]'$  - 0 Simple Staple / Suture removal (25 or less) '[]'$  - 0 Complex Staple / Suture removal (26 or more) '[]'$  - 0 Hypo / Hyperglycemic Management (close monitor of Blood Glucose) '[]'$  - 0 Ankle / Brachial Index (ABI) - do not check if billed separately X- 1 5 Vital Signs Has the patient been seen at the hospital within the last three years: Yes Total Score: 90 Level Of Care: New/Established - Level 3 Electronic Signature(s) Signed: 08/14/2022 3:13:07 PM By: Carlene Coria RN Entered By: Carlene Coria on 08/13/2022 12:06:43 -------------------------------------------------------------------------------- Encounter Discharge Information Details Patient Name: Date of Service: Timothy Powell. 08/13/2022 11:15 A M Medical Record Number: [id number] Patient Account Number:  [id number] Date of Birth/Sex: Treating RN: 12-Jan-1976 (46 y.o. [name] Primary Care Brahm Barbeau: PA Timothy Powell, NO Other Clinician: Referring Dayshaun Whobrey: Treating Lasondra Hodgkins/Extender: Timothy Powell in Treatment: 3 Encounter Discharge Information Items Discharge Condition: Stable Ambulatory Status: Ambulatory Discharge Destination: Home Transportation: Private Auto Accompanied By: self Schedule Follow-up Appointment: Yes Clinical Summary of Care: Electronic Signature(s) Signed: 08/14/2022 3:13:07 PM By: Carlene Coria RN Entered By: Carlene Coria on 08/13/2022 12:53:13 -------------------------------------------------------------------------------- Lower Extremity Assessment Details Patient Name: Date of Service: Timothy Powell, Timothy Powell 08/13/2022 11:15 A DASH, CARDARELLI Powell ([id number]) [id number] Page 4 of 9 Medical Record Number: [id number] Patient Account Number: [id number] Date of Birth/Sex: Treating RN: 08-31-1975 (46 y.o. Timothy Powell) Carlene Coria Primary Care Gailyn Crook: PA Timothy Powell, [place] Other Clinician: Referring Geniene List: Treating Shadee Montoya/Extender: Timothy Powell in Treatment: 3 Edema Assessment Assessed: [Left: No] [Right: No] Edema: [Left: Ye] [Right: s] Calf Left: Right: Point of Measurement: 36 cm From Medial Instep 30 cm Ankle Left: Right: Point of Measurement: 12 cm From Medial Instep 21 cm Vascular Assessment Pulses: Dorsalis Pedis Palpable: [Right:Yes] Electronic Signature(s) Signed: 08/14/2022 3:13:07 PM By: Carlene Coria RN Entered By: Carlene Coria on 08/13/2022 11:32:17 -------------------------------------------------------------------------------- Multi Wound Chart Details Patient Name: Date of Service: Timothy Powell. 08/13/2022 11:15 A M Medical Record Number: [id number] Patient Account Number: [id number] Date of Birth/Sex: Treating RN: September 24, 1975 (46 y.o. Timothy Powell) Carlene Coria Primary Care Canaan Prue: PA Timothy Powell, NO  Other Clinician: Referring Ardis Lawley: Treating Rochel Privett/Extender: Timothy Powell in Treatment: 3 Vital Signs Height(in): 70 Pulse(bpm): 82 Weight(lbs): 200 Blood Pressure(mmHg): 171/91 Body Mass Index(BMI): 28.7 Temperature(F): 97.9 Respiratory Rate(breaths/min): 18 [1:Photos:] [N/A:N/A] Right, Lateral Ankle N/A N/A Wound Location: Gradually Appeared N/A N/A Wounding Event: Trauma, Other N/A N/A Primary EtiologyLORRIN, NAWROT Powell ([id number]) [id number] Page 5 of 9 Hypertension N/A  N/A Comorbid History: 04/29/2022 N/A N/A Date Acquired: 3 N/A N/A Weeks of Treatment: Open N/A N/A Wound Status: No N/A N/A Wound Recurrence: 3.5x1.5x0.1 N/A N/A Measurements L x W x D (cm) 4.123 N/A N/A A (cm) : rea 0.412 N/A N/A Volume (cm) : 12.50% N/A N/A % Reduction in Area: 12.50% N/A N/A % Reduction in Volume: Full Thickness Without Exposed N/A N/A Classification: Support Structures Medium N/A N/A Exudate Amount: Serosanguineous N/A N/A Exudate Type: red, brown N/A N/A Exudate Color: None Present (0%) N/A N/A Granulation Amount: Large (67-100%) N/A N/A Necrotic Amount: Fat Layer (Subcutaneous Tissue): Yes N/A N/A Exposed Structures: Fascia: No Tendon: No Muscle: No Joint: No Bone: No None N/A N/A Epithelialization: Treatment Notes Electronic Signature(s) Signed: 08/14/2022 3:13:07 PM By: Carlene Coria RN Entered By: Carlene Coria on 08/13/2022 11:33:36 -------------------------------------------------------------------------------- Multi-Disciplinary Care Plan Details Patient Name: Date of Service: Timothy Powell. 08/13/2022 11:15 A M Medical Record Number: [id number] Patient Account Number: [id number] Date of Birth/Sex: Treating RN: [birth year] (46 y.o. Timothy Powell) Carlene Coria Primary Care Monike Bragdon: PA Timothy Powell, NO Other Clinician: Referring Allye Hoyos: Treating Giovanne Nickolson/Extender: Timothy Powell in  Treatment: 3 Active Inactive Necrotic Tissue Nursing Diagnoses: Knowledge deficit related to management of necrotic/devitalized tissue Goals: Necrotic/devitalized tissue will be minimized in the wound bed Date Initiated: 07/23/2022 Target Resolution Date: 08/23/2022 Goal Status: Active Patient/caregiver will verbalize understanding of reason and process for debridement of necrotic tissue Date Initiated: 07/23/2022 Target Resolution Date: 08/23/2022 Goal Status: Active Interventions: Assess patient pain level pre-, during and post procedure and prior to discharge Provide education on necrotic tissue and debridement process Notes: ARYAV, WIMBERLY Powell ([id number]) [id number] Page 6 of 9 Nursing Diagnoses: Knowledge deficit related to ulceration/compromised skin integrity Goals: Patient/caregiver will verbalize understanding of skin care regimen Date Initiated: 07/23/2022 Target Resolution Date: 08/23/2022 Goal Status: Active Ulcer/skin breakdown will have a volume reduction of 30% by week 4 Date Initiated: 07/23/2022 Target Resolution Date: 08/23/2022 Goal Status: Active Ulcer/skin breakdown will have a volume reduction of 50% by week 8 Date Initiated: 07/23/2022 Target Resolution Date: 09/23/2022 Goal Status: Active Ulcer/skin breakdown will have a volume reduction of 80% by week 12 Date Initiated: 07/23/2022 Target Resolution Date: 10/22/2022 Goal Status: Active Ulcer/skin breakdown will heal within 14 weeks Date Initiated: 07/23/2022 Target Resolution Date: 11/22/2022 Goal Status: Active Interventions: Assess patient/caregiver ability to obtain necessary supplies Assess patient/caregiver ability to perform ulcer/skin care regimen upon admission and as needed Assess ulceration(s) every visit Notes: Electronic Signature(s) Signed: 08/14/2022 3:13:07 PM By: Carlene Coria RN Entered By: Carlene Coria on 08/13/2022  11:32:51 -------------------------------------------------------------------------------- Pain Assessment Details Patient Name: Date of Service: Timothy Powell, Timothy Powell. 08/13/2022 11:15 A M Medical Record Number: [id number] Patient Account Number: [id number] Date of Birth/Sex: Treating RN: 1976-07-15 (46 y.o. [name] Primary Care Fairley Copher: PA Timothy Powell, NO Other Clinician: Referring Koriana Stepien: Treating Kennice Finnie/Extender: Timothy Powell in Treatment: 3 Active Problems Location of Pain Severity and Description of Pain Patient Has Paino No Site Locations Timothy Powell, Timothy Powell ([id number]) [id number] Page 7 of 9 Pain Management and Medication Current Pain Management: Electronic Signature(s) Signed: 08/14/2022 3:13:07 PM By: Carlene Coria RN Entered By: Carlene Coria on 08/13/2022 11:26:14 -------------------------------------------------------------------------------- Patient/Caregiver Education Details Patient Name: Date of Service: Timothy Powell 1/10/2024andnbsp11:15 A M Medical Record Number: [id number] Patient Account Number: [id number] Date of Birth/Gender: Treating RN: 10-23-1975 (47 y.o. Timothy Powell) Carlene Coria Primary Care Physician: PA Timothy Powell, NO Other Clinician: Referring Physician: Treating Physician/Extender:  Sheran Fava Weeks in Treatment: 3 Education Assessment Education Provided To: Patient Education Topics Provided Wound/Skin Impairment: Methods: Explain/Verbal Responses: State content correctly [employer]) Signed: 08/14/2022 3:13:07 PM By: Carlene Coria RN Entered By: Carlene Coria on 08/13/2022 11:32:40 Timothy Powell ([id number]) [id number] Page 8 of 9 -------------------------------------------------------------------------------- Wound Assessment Details Patient Name: Date of Service: Timothy Powell, Timothy Powell 08/13/2022 11:15 A M Medical Record Number: [id number] Patient  Account Number: [id number] Date of Birth/Sex: Treating RN: 17-Jun-1976 (46 y.o. Timothy Powell) Carlene Coria Primary Care Jeris Roser: PA Timothy Powell, NO Other Clinician: Referring Kongmeng Santoro: Treating Adam Sanjuan/Extender: Timothy Powell in Treatment: 3 Wound Status Wound Number: 1 Primary Etiology: Trauma, Other Wound Location: Right, Lateral Ankle Wound Status: Open Wounding Event: Gradually Appeared Comorbid History: Hypertension Date Acquired: 04/29/2022 Weeks Of Treatment: 3 Clustered Wound: No Photos Wound Measurements Length: (cm) 3.5 Width: (cm) 1.5 Depth: (cm) 0.1 Area: (cm) 4.123 Volume: (cm) 0.412 % Reduction in Area: 12.5% % Reduction in Volume: 12.5% Epithelialization: None Wound Description Classification: Full Thickness Without Exposed Support Structures Exudate Amount: Medium Exudate Type: Serosanguineous Exudate Color: red, brown Foul Odor After Cleansing: No Slough/Fibrino Yes Wound Bed Granulation Amount: None Present (0%) Exposed Structure Necrotic Amount: Large (67-100%) Fascia Exposed: No Necrotic Quality: Adherent Slough Fat Layer (Subcutaneous Tissue) Exposed: Yes Tendon Exposed: No Muscle Exposed: No Joint Exposed: No Bone Exposed: No Treatment Notes Wound #1 (Ankle) Wound Laterality: Right, Lateral Cleanser [employer] Ancillary Kit - 15 Day Supply Discharge Instruction: Use supplies as instructed; Kit contains: (15) Saline Bullets; (15) 3x3 Gauze; 15 pr Gloves Peri-Wound Care Timothy Powell, Timothy Powell ([id number]) [id number] Page 9 of 9 Topical Primary Dressing Honey: Activon Honey Gel, 25 (g) Tube Hydrofera Blue Ready Transfer Foam, 2.5x2.5 (in/in) Discharge Instruction: Apply Hydrofera Blue Ready to wound bed as directed Secondary Dressing (BORDER) Zetuvit Plus SILICONE BORDER Dressing 5x5 (in/in) Discharge Instruction: Please do not put silicone bordered dressings under wraps. Use non-bordered dressing only. Secured  With Tubigrip Size C, 2.75x10 (in/yd) Discharge Instruction: double Apply 3 Tubigrip C 3-finger-widths below knee to base of toes to secure dressing and/or for swelling. Compression Wrap Compression Stockings Add-Ons Electronic Signature(s) Signed: 08/14/2022 3:13:07 PM By: Carlene Coria RN Entered By: Carlene Coria on 08/13/2022 11:30:40 -------------------------------------------------------------------------------- Vitals Details Patient Name: Date of Service: Timothy Powell. 08/13/2022 11:15 A M Medical Record Number: [id number] Patient Account Number: [id number] Date of Birth/Sex: Treating RN: 23-Sep-1975 (46 y.o. Timothy Powell) Carlene Coria Primary Care Everrett Lacasse: PA Timothy Powell, [place] Other Clinician: Referring Myrl Bynum: Treating Aarti Mankowski/Extender: Timothy Powell in Treatment: 3 Vital Signs Time Taken: 11:25 Temperature (F): 97.9 Height (in): 70 Pulse (bpm): 82 Weight (lbs): 200 Respiratory Rate (breaths/min): 18 Body Mass Index (BMI): 28.7 Blood Pressure (mmHg): 171/91 Reference Range: 80 - 120 mg / dl Electronic Signature(s) Signed: 08/14/2022 3:13:07 PM By: Carlene Coria RN Entered By: Carlene Coria on 08/13/2022 11:26:02

## 2022-08-20 ENCOUNTER — Encounter: Payer: Medicare Other | Admitting: Internal Medicine

## 2022-08-27 ENCOUNTER — Encounter (HOSPITAL_BASED_OUTPATIENT_CLINIC_OR_DEPARTMENT_OTHER): Payer: Medicare Other | Admitting: Internal Medicine

## 2022-08-27 DIAGNOSIS — L97812 Non-pressure chronic ulcer of other part of right lower leg with fat layer exposed: Secondary | ICD-10-CM | POA: Diagnosis not present

## 2022-08-27 DIAGNOSIS — L03115 Cellulitis of right lower limb: Secondary | ICD-10-CM | POA: Diagnosis not present

## 2022-08-27 DIAGNOSIS — T798XXA Other early complications of trauma, initial encounter: Secondary | ICD-10-CM | POA: Diagnosis not present
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
## 2022-09-03 ENCOUNTER — Ambulatory Visit: Payer: Medicare Other | Admitting: Internal Medicine

## 2023-12-23 ENCOUNTER — Other Ambulatory Visit: Payer: Self-pay

## 2023-12-23 MED ORDER — LISINOPRIL 10 MG PO TABS: 10.0000 mg | ORAL_TABLET | Freq: Every day | ORAL | 0 refills | Status: DC

## 2024-01-27 ENCOUNTER — Other Ambulatory Visit: Payer: Self-pay | Admitting: Cardiology
